# Patient Record
Sex: Female | Born: 1935 | Race: White | Hispanic: No | Smoking: Never smoker
Health system: Southern US, Community
[De-identification: ages and names within clinical notes are randomized; demographics above are authoritative.]

## PROBLEM LIST (undated history)

## (undated) DIAGNOSIS — Z8673 Personal history of transient ischemic attack (TIA), and cerebral infarction without residual deficits: Secondary | ICD-10-CM

## (undated) DIAGNOSIS — F0390 Unspecified dementia without behavioral disturbance: Secondary | ICD-10-CM

## (undated) DIAGNOSIS — I2510 Atherosclerotic heart disease of native coronary artery without angina pectoris: Secondary | ICD-10-CM

## (undated) DIAGNOSIS — E785 Hyperlipidemia, unspecified: Secondary | ICD-10-CM

## (undated) HISTORY — DX: Atherosclerotic heart disease of native coronary artery without angina pectoris: I25.10

## (undated) HISTORY — DX: Personal history of transient ischemic attack (TIA), and cerebral infarction without residual deficits: Z86.73

## (undated) HISTORY — DX: Hyperlipidemia, unspecified: E78.5

---

## 2004-08-04 ENCOUNTER — Ambulatory Visit: Payer: Self-pay | Admitting: Gastroenterology

## 2004-09-20 ENCOUNTER — Ambulatory Visit: Payer: Self-pay | Admitting: Internal Medicine

## 2005-09-22 ENCOUNTER — Ambulatory Visit: Payer: Self-pay | Admitting: Internal Medicine

## 2006-06-04 ENCOUNTER — Ambulatory Visit: Payer: Self-pay | Admitting: Ophthalmology

## 2006-06-11 ENCOUNTER — Ambulatory Visit: Payer: Self-pay | Admitting: Ophthalmology

## 2006-09-26 ENCOUNTER — Ambulatory Visit: Payer: Self-pay | Admitting: Internal Medicine

## 2007-09-30 ENCOUNTER — Ambulatory Visit: Payer: Self-pay | Admitting: Internal Medicine

## 2007-10-16 ENCOUNTER — Ambulatory Visit: Payer: Self-pay | Admitting: Ophthalmology

## 2007-10-16 ENCOUNTER — Other Ambulatory Visit: Payer: Self-pay

## 2007-11-05 ENCOUNTER — Ambulatory Visit: Payer: Self-pay | Admitting: Ophthalmology

## 2008-10-01 ENCOUNTER — Ambulatory Visit: Payer: Self-pay | Admitting: Internal Medicine

## 2009-10-07 ENCOUNTER — Ambulatory Visit: Payer: Self-pay | Admitting: Internal Medicine

## 2010-10-18 ENCOUNTER — Ambulatory Visit: Payer: Self-pay | Admitting: Internal Medicine

## 2012-05-13 ENCOUNTER — Ambulatory Visit: Payer: Self-pay | Admitting: Internal Medicine

## 2012-05-13 IMAGING — CT CT HEAD WITHOUT CONTRAST
1 series · 16 of 27 positions shown, 20 images · non-contrast
Comparison: none

REASON FOR EXAM: memory loss  eval senile dementia
COMMENTS:

PROCEDURE:     KCT - KCT HEAD WITHOUT CONTRAST  - [DATE]  [DATE]
RESULT:     History: Memory loss.
Comparison Study: No recent.

[Series 2: soft tissue · axial · 0.42mm/px · z∈[-213,-93]mm · 16 of 27 slices shown, 20 images]
[im 2/27  brain]
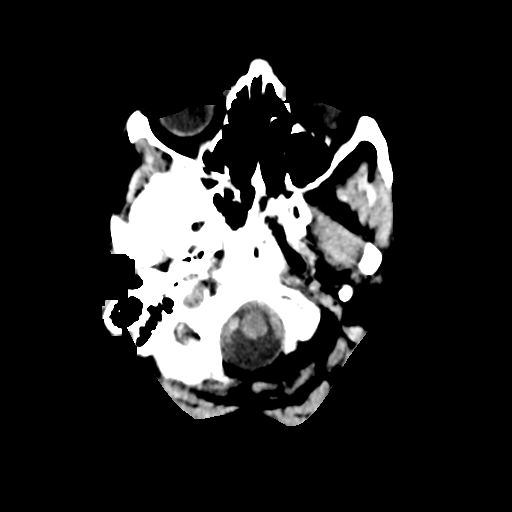
[im 2/27  bone]
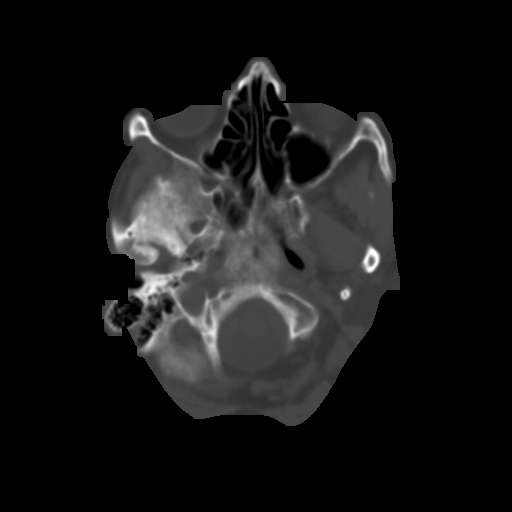
[im 4/27  brain]
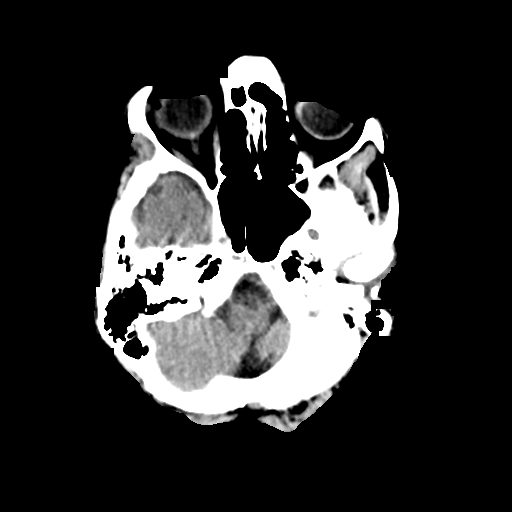
[im 5/27  brain]
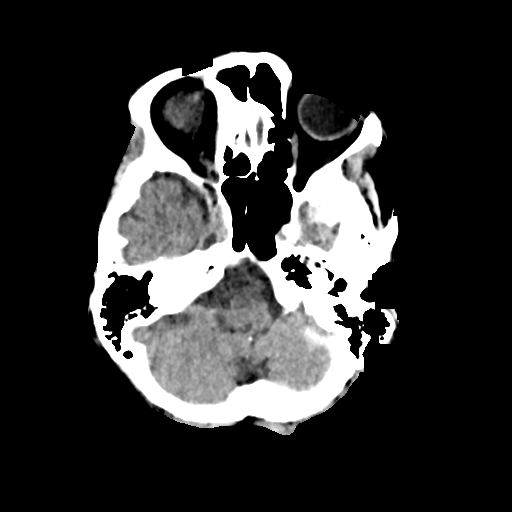
[im 7/27  brain]
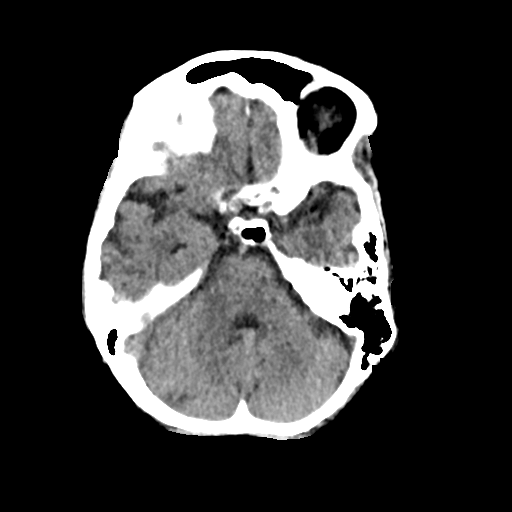
[im 9/27  brain]
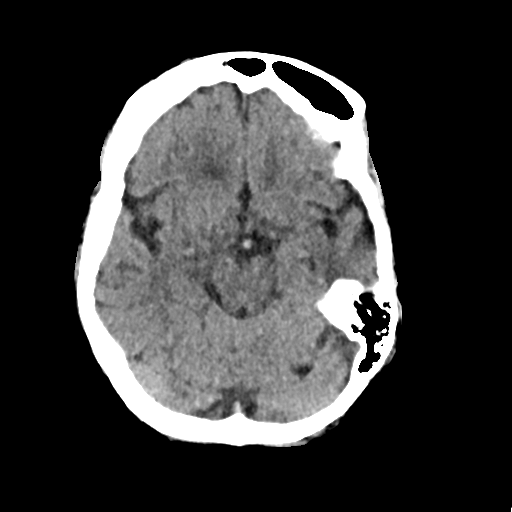
[im 9/27  bone]
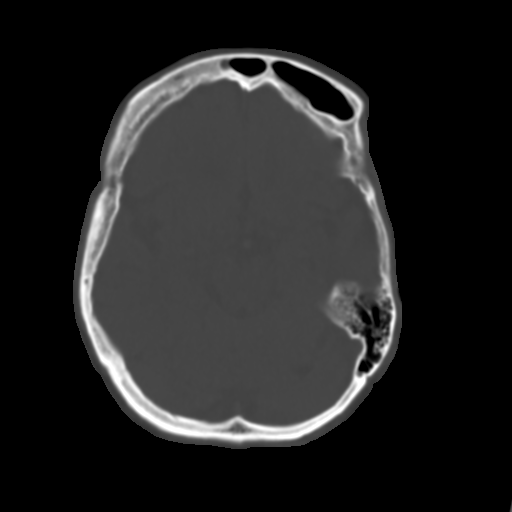
[im 10/27  brain]
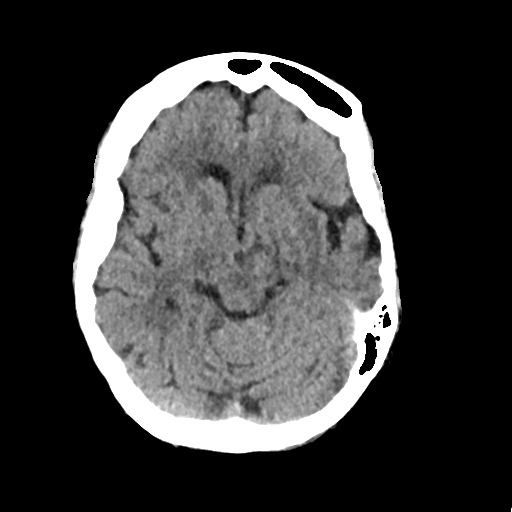
[im 12/27  brain]
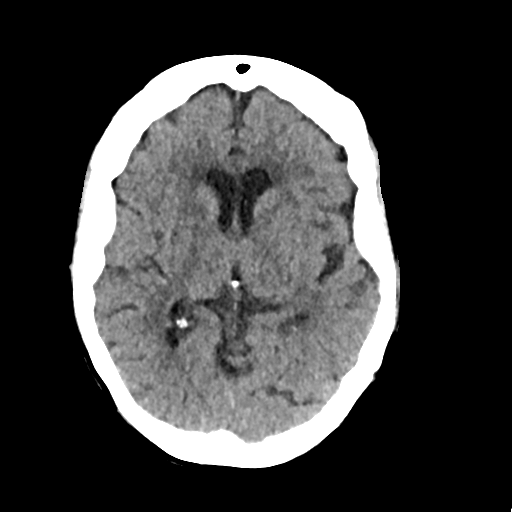
[im 13/27  brain]
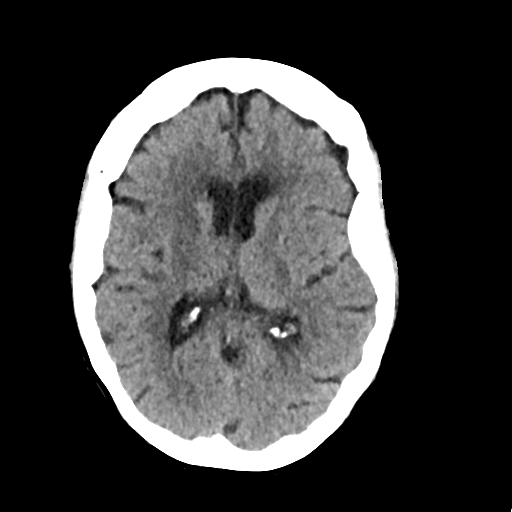
[im 15/27  brain]
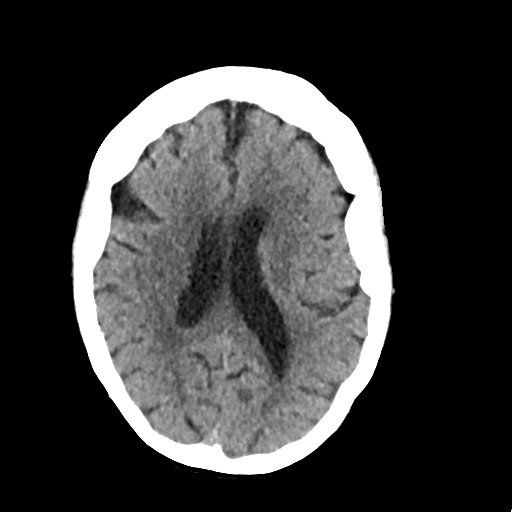
[im 15/27  bone]
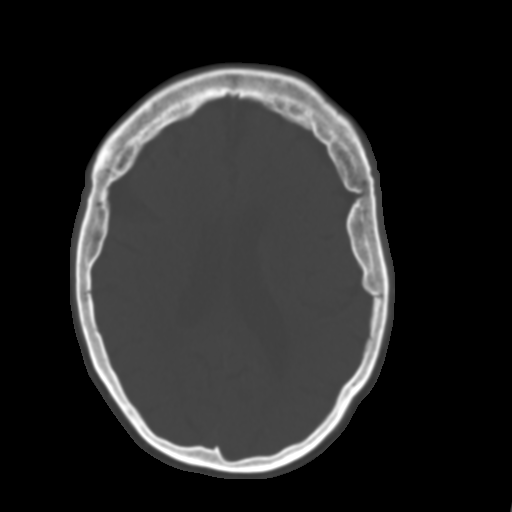
[im 16/27  brain]
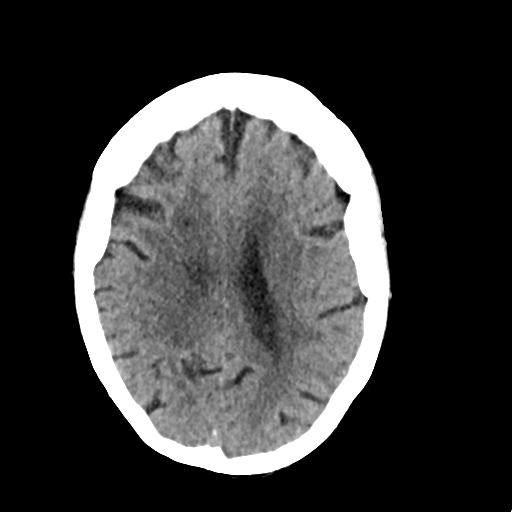
[im 18/27  brain]
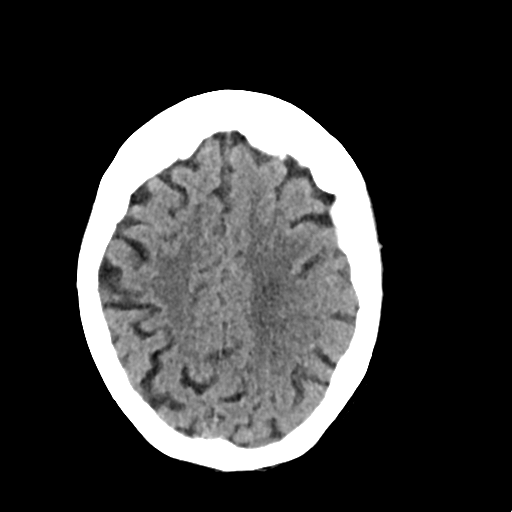
[im 19/27  brain]
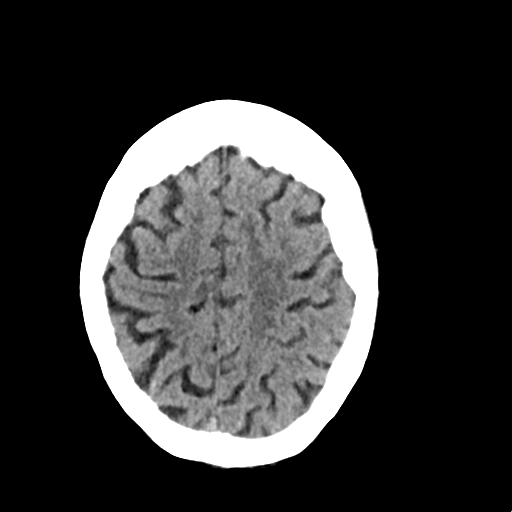
[im 21/27  brain]
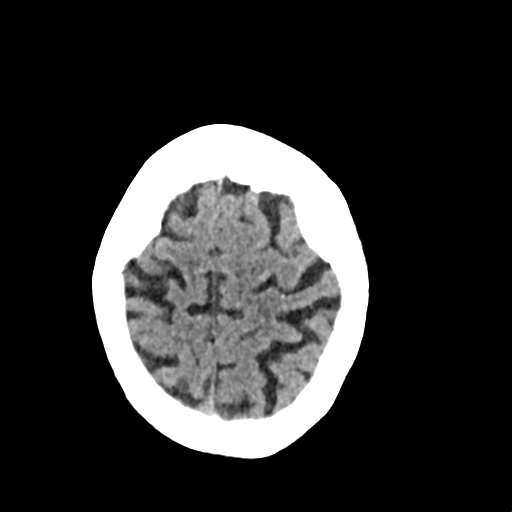
[im 21/27  bone]
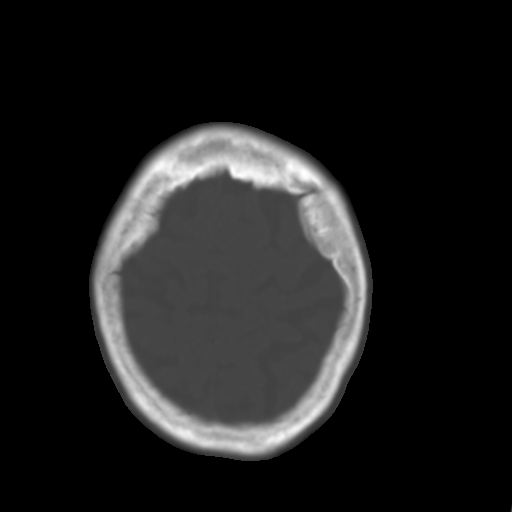
[im 23/27  brain]
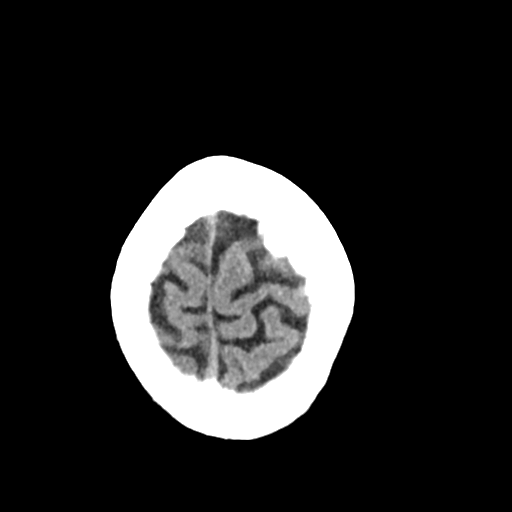
[im 24/27  brain]
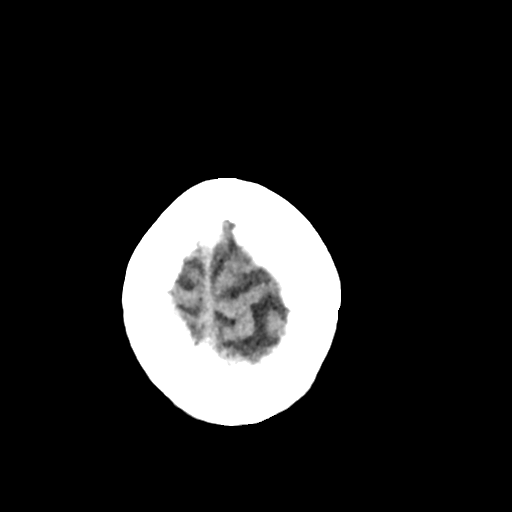
[im 26/27  brain]
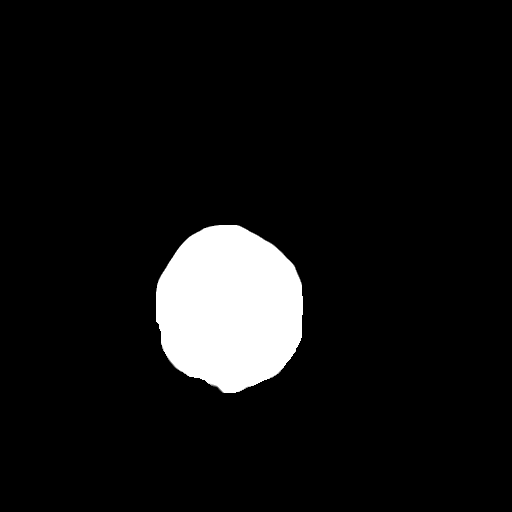

[16 of 27 positions shown; findings below may reference images not displayed]

FINDINGS: Standard nonenhanced CT obtained. Preventricular white matter
changes noted consistent with chronic ischemia. No mass. No hydrocephalus.
No hemorrhage. Orbits and paranasal sinuses normal where visualized. No bony
abnormality.
IMPRESSION: Chronic ischemic change.

## 2016-02-15 ENCOUNTER — Encounter: Payer: Medicare HMO | Attending: Physician Assistant | Admitting: Physician Assistant

## 2016-02-15 DIAGNOSIS — L89312 Pressure ulcer of right buttock, stage 2: Secondary | ICD-10-CM | POA: Insufficient documentation

## 2016-02-15 DIAGNOSIS — L89322 Pressure ulcer of left buttock, stage 2: Secondary | ICD-10-CM | POA: Diagnosis present

## 2016-02-15 DIAGNOSIS — F0390 Unspecified dementia without behavioral disturbance: Secondary | ICD-10-CM | POA: Insufficient documentation

## 2016-02-16 NOTE — Progress Notes (Signed)
Sherrilyn RistBLANCHARD, CHRISTINE M. ([id number]) Visit Report for 02/15/2016 Allergy List Details Perry MountBLANCHARD, CHRISTINE 02/15/2016 8:00 Patient Name: Date of Service: M. AM Medical Record Patient Account Number: [id number] Number: Treating RN: Huel CoventryWoody, Kim Date of Birth/Sex: 10/06/1927 (82 y.o. Female) Other Clinician: Primary Care Physician: Barbette ReichmannHande, Vishwanath Treating STONE III, HOYT Referring Physician: Bobby RumpfAREW, KHARY Physician/Extender: Weeks in Treatment: 0 Allergies Active Allergies No Known Drug Allergies Allergy Notes Electronic Signature(s) Signed: 02/15/2016 5:57:19 PM By: Elliot GurneyWoody, RN, BSN, Kim RN, BSN Entered By: Elliot GurneyWoody, RN, BSN, Kim on 02/15/2016 08:18:45 Sherrilyn RistBLANCHARD, CHRISTINE M. ([id number]) -------------------------------------------------------------------------------- Arrival Information Details Perry MountBLANCHARD, CHRISTINE 02/15/2016 8:00 Patient Name: Date of Service: M. AM Medical Record Patient Account Number: [id number] Number: Treating RN: Huel CoventryWoody, Kim Date of Birth/Sex: 10/19/1927 (82 y.o. Female) Other Clinician: Primary Care Physician: Marcello FennelHande, Vishwanath Treating STONE III, HOYT Referring Physician: Bobby RumpfAREW, KHARY Physician/Extender: Tania AdeWeeks in Treatment: 0 Visit Information Patient Arrived: Ambulatory Arrival Time: 08:16 Accompanied By: sister in law, Juel BurrowBecky Miles Transfer Assistance: Manual Patient Identification Verified: Yes Secondary Verification Process Yes Completed: Patient Requires Transmission- No Based Precautions: Patient Has Alerts: No Electronic Signature(s) Signed: 02/15/2016 5:57:19 PM By: Elliot GurneyWoody, RN, BSN, Kim RN, BSN Entered By: Elliot GurneyWoody, RN, BSN, Kim on 02/15/2016 08:16:51 Sherrilyn RistBLANCHARD, CHRISTINE M. ([id number]) -------------------------------------------------------------------------------- Clinic Level of Care Assessment Details Perry MountBLANCHARD, CHRISTINE 02/15/2016 8:00 Patient Name: Date of Service: M. AM Medical Record Patient Account Number:  [id number] Number: Treating RN: Huel CoventryWoody, Kim Date of Birth/Sex: 07/12/1927 (82 y.o. Female) Other Clinician: Primary Care Physician: Marcello FennelHande, Vishwanath Treating STONE III, HOYT Referring Physician: Bobby RumpfAREW, KHARY Physician/Extender: Weeks in Treatment: 0 Clinic Level of Care Assessment Items TOOL 2 Quantity Score []  - Use when only an EandM is performed on the INITIAL visit 0 ASSESSMENTS - Nursing Assessment / Reassessment X - General Physical Exam (combine w/ comprehensive assessment (listed just 1 20 below) when performed on new pt. evals) X - Comprehensive Assessment (HX, ROS, Risk Assessments, Wounds Hx, etc.) 1 25 ASSESSMENTS - Wound and Skin Assessment / Reassessment X - Simple Wound Assessment / Reassessment - one wound 1 5 []  - Complex Wound Assessment / Reassessment - multiple wounds 0 []  - Dermatologic / Skin Assessment (not related to wound area) 0 ASSESSMENTS - Ostomy and/or Continence Assessment and Care []  - Incontinence Assessment and Management 0 []  - Ostomy Care Assessment and Management (repouching, etc.) 0 PROCESS - Coordination of Care X - Simple Patient / Family Education for ongoing care 1 15 []  - Complex (extensive) Patient / Family Education for ongoing care 0 X - Staff obtains ChiropractorConsents, Records, Test Results / Process Orders 1 10 []  - Staff telephones HHA, Nursing Homes / Clarify orders / etc 0 []  - Routine Transfer to another Facility (non-emergent condition) 0 []  - Routine Hospital Admission (non-emergent condition) 0 []  - New Admissions / Manufacturing engineernsurance Authorizations / Ordering NPWT, Apligraf, etc. 0 []  - Emergency Hospital Admission (emergent condition) 0 Micallef, CHRISTINE M. ([id number]) X - Simple Discharge Coordination 1 10 []  - Complex (extensive) Discharge Coordination 0 PROCESS - Special Needs []  - Pediatric / Minor Patient Management 0 []  - Isolation Patient Management 0 []  - Hearing / Language / Visual special needs 0 []  - Assessment of  Community assistance (transportation, D/C planning, etc.) 0 []  - Additional assistance / Altered mentation 0 []  - Support Surface(s) Assessment (bed, cushion, seat, etc.) 0 INTERVENTIONS - Wound Cleansing / Measurement X - Wound Imaging (photographs - any number of wounds) 1 5 []  - Wound Tracing (instead of photographs) 0 X -  Simple Wound Measurement - one wound 1 5 []  - Complex Wound Measurement - multiple wounds 0 X - Simple Wound Cleansing - one wound 1 5 []  - Complex Wound Cleansing - multiple wounds 0 INTERVENTIONS - Wound Dressings X - Small Wound Dressing one or multiple wounds 1 10 []  - Medium Wound Dressing one or multiple wounds 0 []  - Large Wound Dressing one or multiple wounds 0 []  - Application of Medications - injection 0 INTERVENTIONS - Miscellaneous []  - External ear exam 0 []  - Specimen Collection (cultures, biopsies, blood, body fluids, etc.) 0 []  - Specimen(s) / Culture(s) sent or taken to Lab for analysis 0 []  - Patient Transfer (multiple staff / Michiel Sites Lift / Similar devices) 0 []  - Simple Staple / Suture removal (25 or less) 0 Vanessen, CHRISTINE M. ([id number]) []  - Complex Staple / Suture removal (26 or more) 0 []  - Hypo / Hyperglycemic Management (close monitor of Blood Glucose) 0 []  - Ankle / Brachial Index (ABI) - do not check if billed separately 0 Has the patient been seen at the hospital within the last three years: Yes Total Score: 110 Level Of Care: New/Established - Level 3 Electronic Signature(s) Signed: 02/15/2016 5:57:19 PM By: Elliot Gurney, RN, BSN, Kim RN, BSN Entered By: Elliot Gurney, RN, BSN, Kim on 02/15/2016 08:51:46 JANESE, RADABAUGH ([id number]) -------------------------------------------------------------------------------- Encounter Discharge Information Details CHANAH, TIDMORE 02/15/2016 8:00 Patient Name: Date of Service: M. AM Medical Record Patient Account Number: [id number] Number: Treating RN: Huel Coventry Date of  Birth/Sex: [birth year] (82 y.o. Female) Other Clinician: Primary Care Physician: Marcello Fennel, Vishwanath Treating STONE III, HOYT Referring Physician: Bobby Rumpf Physician/Extender: Weeks in Treatment: 0 Encounter Discharge Information Items Discharge Pain Level: 0 Discharge Condition: Stable Ambulatory Status: Ambulatory Discharge Destination: Home Transportation: Private Auto Accompanied By: sister in law Schedule Follow-up Appointment: No Medication Reconciliation completed and provided to Patient/Care Yes Madisin Hasan: Provided on Clinical Summary of Care: 02/15/2016 Form Type Recipient Paper Patient CB Electronic Signature(s) Signed: 02/15/2016 8:55:55 AM By: Gwenlyn Perking Entered By: Gwenlyn Perking on 02/15/2016 08:55:55 Keierra, Nudo CHRISTINE M. ([id number]) -------------------------------------------------------------------------------- Lower Extremity Assessment Details COLBURN, CHRISTINE 02/15/2016 8:00 Patient Name: Date of Service: M. AM Medical Record Patient Account Number: [id number] Number: Treating RN: Huel Coventry Date of Birth/Sex: [birth year] (82 y.o. Female) Other Clinician: Primary Care Physician: Barbette Reichmann Treating STONE III, HOYT Referring Physician: Bobby Rumpf Physician/Extender: Tania Ade in Treatment: 0 Electronic Signature(s) Signed: 02/15/2016 5:57:19 PM By: Elliot Gurney, RN, BSN, Kim RN, BSN Entered By: Elliot Gurney, RN, BSN, Kim on 02/15/2016 08:18:34 OMUNIQUE, PEDERSON ([id number]) -------------------------------------------------------------------------------- Multi Wound Chart Details SPILMAN, CHRISTINE 02/15/2016 8:00 Patient Name: Date of Service: M. AM Medical Record Patient Account Number: [id number] Number: Treating RN: Huel Coventry Date of Birth/Sex: [birth year] (82 y.o. Female) Other Clinician: Primary Care Physician: Marcello Fennel, Vishwanath Treating STONE III, HOYT Referring Physician: Bobby Rumpf Physician/Extender: Weeks in  Treatment: 0 Vital Signs Height(in): 62 Pulse(bpm): 62 Weight(lbs): 101.2 Blood Pressure 122/68 (mmHg): Body Mass Index(BMI): 19 Temperature(F): 98.4 Respiratory Rate 16 (breaths/min): Photos: [1:No Photos] [2:No Photos] [N/A:N/A] Wound Location: [1:Right Gluteus] [2:Left Gluteus] [N/A:N/A] Wounding Event: [1:Pressure Injury] [2:Pressure Injury] [N/A:N/A] Primary Etiology: [1:Pressure Ulcer] [2:Pressure Ulcer] [N/A:N/A] Comorbid History: [1:Dementia] [2:Dementia] [N/A:N/A] Date Acquired: [1:02/01/2016] [2:02/01/2016] [N/A:N/A] Weeks of Treatment: [1:0] [2:0] [N/A:N/A] Wound Status: [1:Open] [2:Open] [N/A:N/A] Measurements L x W x D 2x1x0.1 [2:3x2.8x0.1] [N/A:N/A] (cm) Area (cm) : [1:1.571] [2:6.597] [N/A:N/A] Volume (cm) : [1:0.157] [2:0.66] [N/A:N/A] % Reduction in Area: [1:76.20%] [2:N/A] [N/A:N/A] % Reduction in Volume: 76.20% [2:N/A] [N/A:N/A] Classification: [1:Category/Stage  II] [2:Category/Stage II] [N/A:N/A] Exudate Amount: [1:None Present] [2:None Present] [N/A:N/A] Wound Margin: [1:Flat and Intact] [2:Flat and Intact] [N/A:N/A] Granulation Amount: [1:Large (67-100%)] [2:Large (67-100%)] [N/A:N/A] Granulation Quality: [1:Pink] [2:N/A] [N/A:N/A] Necrotic Amount: [1:None Present (0%)] [2:None Present (0%)] [N/A:N/A] Exposed Structures: [1:Fascia: No Fat: No Tendon: No Muscle: No Joint: No Bone: No Limited to Skin Breakdown] [2:Fascia: No Fat: No Tendon: No Muscle: No Joint: No Bone: No Limited to Skin Breakdown] [N/A:N/A] Epithelialization: Large (67-100%) Large (67-100%) N/A Periwound Skin Texture: Edema: No Edema: No N/A Excoriation: No Excoriation: No Induration: No Induration: No Callus: No Callus: No Crepitus: No Crepitus: No Fluctuance: No Fluctuance: No Friable: No Friable: No Rash: No Rash: No Scarring: No Scarring: No Periwound Skin Maceration: No Maceration: No N/A Moisture: Moist: No Moist: No Dry/Scaly: No Dry/Scaly: No Periwound  Skin Color: Rubor: Yes Rubor: Yes N/A Atrophie Blanche: No Atrophie Blanche: No Cyanosis: No Cyanosis: No Ecchymosis: No Ecchymosis: No Erythema: No Erythema: No Hemosiderin Staining: No Hemosiderin Staining: No Mottled: No Mottled: No Pallor: No Pallor: No Temperature: No Abnormality No Abnormality N/A Tenderness on No No N/A Palpation: Wound Preparation: Ulcer Cleansing: Ulcer Cleansing: N/A Rinsed/Irrigated with Rinsed/Irrigated with Saline Saline Topical Anesthetic Topical Anesthetic Applied: None Applied: None Treatment Notes Electronic Signature(s) Signed: 02/15/2016 5:57:19 PM By: Elliot Gurney, RN, BSN, Kim RN, BSN Entered By: Elliot Gurney, RN, BSN, Kim on 02/15/2016 08:46:39 MAUD, RUBENDALL ([id number]) -------------------------------------------------------------------------------- Pain Assessment Details RADLEY, TESTON 02/15/2016 8:00 Patient Name: Date of Service: M. AM Medical Record Patient Account Number: [id number] Number: Treating RN: Huel Coventry Date of Birth/Sex: [birth year] (82 y.o. Female) Other Clinician: Primary Care Physician: Marcello Fennel, Vishwanath Treating STONE III, HOYT Referring Physician: Bobby Rumpf Physician/Extender: Weeks in Treatment: 0 Active Problems Location of Pain Severity and Description of Pain Patient Has Paino No Site Locations With Dressing Change: No Pain Management and Medication Current Pain Management: Electronic Signature(s) Signed: 02/15/2016 5:57:19 PM By: Elliot Gurney, RN, BSN, Kim RN, BSN Entered By: Elliot Gurney, RN, BSN, Kim on 02/15/2016 08:17:01 DARYA, BIGLER ([id number]) -------------------------------------------------------------------------------- Patient/Caregiver Education Details LANEAH, LUFT 02/15/2016 8:00 Patient Name: Date of Service: M. AM Medical Record Patient Account Number: [id number] Number: Treating RN: Huel Coventry Date of Birth/Gender: [birth year] (82 y.o.  Female) Other Clinician: Primary Care Physician: Marcello Fennel, Vishwanath Treating STONE III, HOYT Referring Physician: Bobby Rumpf Physician/Extender: Tania Ade in Treatment: 0 Education Assessment Education Provided To: Patient and Caregiver Education Topics Provided Pressure: Handouts: Preventing Pressure Ulcers Methods: Demonstration, Explain/Verbal, Printed Responses: State content correctly Safety: Handouts: Personal Safety Methods: Explain/Verbal Responses: State content correctly Electronic Signature(s) Signed: 02/15/2016 5:57:19 PM By: Elliot Gurney, RN, BSN, Kim RN, BSN Entered By: Elliot Gurney, RN, BSN, Kim on 02/15/2016 08:52:58 RUTHELLA, KIRCHMAN ([id number]) -------------------------------------------------------------------------------- Wound Assessment Details DARCELLE, HERRADA 02/15/2016 8:00 Patient Name: Date of Service: M. AM Medical Record Patient Account Number: [id number] Number: Treating RN: Huel Coventry Date of Birth/Sex: 08/26/1927 (82 y.o. Female) Other Clinician: Primary Care Physician: Barbette Reichmann Treating STONE III, HOYT Referring Physician: Bobby Rumpf Physician/Extender: Weeks in Treatment: 0 Wound Status Wound Number: 1 Primary Etiology: Pressure Ulcer Wound Location: Right Gluteus Wound Status: Healed - Epithelialized Wounding Event: Pressure Injury Comorbid History: Dementia Date Acquired: 02/01/2016 Weeks Of Treatment: 0 Clustered Wound: No Photos Photo Uploaded By: Elliot Gurney, RN, BSN, Kim on 02/15/2016 09:02:13 Wound Measurements Length: (cm) 0 % Reduction i Width: (cm) 0 % Reduction i Depth: (cm) 0 Epithelializa Area: (cm) 0 Tunneling: Volume: (cm) 0 Undermining: n Area: 100% n Volume: 100% tion: Large (67-100%) No No Wound  Description Classification: Category/Stage II Wound Margin: Flat and Intact Exudate Amount: None Present Wound Bed Granulation Amount: Large (67-100%) Exposed Structure Granulation Quality:  Pink Fascia Exposed: No Necrotic Amount: None Present (0%) Fat Layer Exposed: No Tendon Exposed: No Muscle Exposed: No RUBYANN, LINGLE. ([id number]) Joint Exposed: No Bone Exposed: No Limited to Skin Breakdown Periwound Skin Texture Texture Color No Abnormalities Noted: No No Abnormalities Noted: No Callus: No Atrophie Blanche: No Crepitus: No Cyanosis: No Excoriation: No Ecchymosis: No Fluctuance: No Erythema: No Friable: No Hemosiderin Staining: No Induration: No Mottled: No Localized Edema: No Pallor: No Rash: No Rubor: Yes Scarring: No Temperature / Pain Moisture Temperature: No Abnormality No Abnormalities Noted: No Dry / Scaly: No Maceration: No Moist: No Wound Preparation Ulcer Cleansing: Rinsed/Irrigated with Saline Topical Anesthetic Applied: None Treatment Notes Wound #1 (Right Gluteus) 1. Cleansed with: Clean wound with Normal Saline 5. Secondary Dressing Applied Bordered Foam Dressing Electronic Signature(s) Signed: 02/15/2016 5:57:19 PM By: Elliot Gurney, RN, BSN, Kim RN, BSN Entered By: Elliot Gurney, RN, BSN, Kim on 02/15/2016 08:54:01 KENNEDI, LIZARDO ([id number]) -------------------------------------------------------------------------------- Wound Assessment Details LANNA, LABELLA 02/15/2016 8:00 Patient Name: Date of Service: M. AM Medical Record Patient Account Number: [id number] Number: Treating RN: Huel Coventry Date of Birth/Sex: Jul 21, 1927 (82 y.o. Female) Other Clinician: Primary Care Physician: Marcello Fennel, Vishwanath Treating STONE III, HOYT Referring Physician: Bobby Rumpf Physician/Extender: Weeks in Treatment: 0 Wound Status Wound Number: 2 Primary Etiology: Pressure Ulcer Wound Location: Left Gluteus Wound Status: Healed - Epithelialized Wounding Event: Pressure Injury Comorbid History: Dementia Date Acquired: 02/01/2016 Weeks Of Treatment: 0 Clustered Wound: No Photos Photo Uploaded By: Elliot Gurney, RN, BSN, Kim on  02/15/2016 09:02:14 Wound Measurements Length: (cm) 0 % Reduction i Width: (cm) 0 % Reduction i Depth: (cm) 0 Epithelializa Area: (cm) 0 Tunneling: Volume: (cm) 0 Undermining: n Area: 100% n Volume: 100% tion: Large (67-100%) No No Wound Description Classification: Category/Stage II Wound Margin: Flat and Intact Exudate Amount: None Present Wound Bed Granulation Amount: Large (67-100%) Exposed Structure Necrotic Amount: None Present (0%) Fascia Exposed: No Fat Layer Exposed: No Tendon Exposed: No Muscle Exposed: No Nima, [place] CHRISTINE M. ([id number]) Joint Exposed: No Bone Exposed: No Limited to Skin Breakdown Periwound Skin Texture Texture Color No Abnormalities Noted: No No Abnormalities Noted: No Callus: No Atrophie Blanche: No Crepitus: No Cyanosis: No Excoriation: No Ecchymosis: No Fluctuance: No Erythema: No Friable: No Hemosiderin Staining: No Induration: No Mottled: No Localized Edema: No Pallor: No Rash: No Rubor: Yes Scarring: No Temperature / Pain Moisture Temperature: No Abnormality No Abnormalities Noted: No Dry / Scaly: No Maceration: No Moist: No Wound Preparation Ulcer Cleansing: Rinsed/Irrigated with Saline Topical Anesthetic Applied: None Treatment Notes Wound #2 (Left Gluteus) 1. Cleansed with: Clean wound with Normal Saline 5. Secondary Dressing Applied Bordered Foam Dressing Electronic Signature(s) Signed: 02/15/2016 5:57:19 PM By: Elliot Gurney, RN, BSN, Kim RN, BSN Entered By: Elliot Gurney, RN, BSN, Kim on 02/15/2016 08:54:01 TOMIE, SPIZZIRRI ([id number]) -------------------------------------------------------------------------------- Vitals Details KLINDT, CHRISTINE 02/15/2016 8:00 Patient Name: Date of Service: M. AM Medical Record Patient Account Number: [id number] Number: Treating RN: Huel Coventry Date of Birth/Sex: 25-Dec-1927 (82 y.o. Female) Other Clinician: Primary Care Physician: Marcello Fennel, Vishwanath  Treating STONE III, HOYT Referring Physician: Bobby Rumpf Physician/Extender: Weeks in Treatment: 0 Vital Signs Time Taken: 08:17 Temperature (F): 98.4 Height (in): 62 Pulse (bpm): 62 Source: Stated Respiratory Rate (breaths/min): 16 Weight (lbs): 101.2 Blood Pressure (mmHg): 122/68 Source: Measured Reference Range: 80 - 120 mg / dl Body Mass Index (BMI): 18.5 Electronic  Signature(s) Signed: 02/15/2016 5:57:19 PM By: Elliot GurneyWoody, RN, BSN, Kim RN, BSN Entered By: Elliot GurneyWoody, RN, BSN, Kim on 02/15/2016 08:18:20

## 2016-02-16 NOTE — Progress Notes (Signed)
Elizabeth [name], Elizabeth M. ([id number]) Visit Report for 02/15/2016 Abuse/Suicide Risk Screen Details Elizabeth [name], Elizabeth 02/15/2016 8:00 Patient Name: Date of Service: M. AM Medical Record Patient Account Number: [id number] Number: Treating RN: Huel CoventryWoody, Kim Date of Birth/Sex: 07/05/1927 (82 y.o. Female) Other Clinician: Primary Care Treating Hande, Vishwanath STONE III, HOYT Physician: Physician/Extender: Referring Physician: Bobby RumpfAREW, KHARY Weeks in Treatment: 0 Abuse/Suicide Risk Screen Items Answer ABUSE/SUICIDE RISK SCREEN: Has anyone close to you tried to hurt or harm you recentlyo No Do you feel uncomfortable with anyone in your familyo No Has anyone forced you do things that you didnot want to doo No Do you have any thoughts of harming yourselfo No Patient displays signs or symptoms of abuse and/or neglect. No Electronic Signature(s) Signed: 02/15/2016 5:57:19 PM By: Elliot GurneyWoody, RN, BSN, Kim RN, BSN Entered By: Elliot GurneyWoody, RN, BSN, Kim on 02/15/2016 08:23:46 Elizabeth [name], Elizabeth M. ([id number]) -------------------------------------------------------------------------------- Activities of Daily Living Details Elizabeth [name], Elizabeth 02/15/2016 8:00 Patient Name: Date of Service: M. AM Medical Record Patient Account Number: [id number] Number: Treating RN: Huel CoventryWoody, Kim Date of Birth/Sex: 08/16/1927 (82 y.o. Female) Other Clinician: Primary Care Treating Hande, Vishwanath STONE III, HOYT Physician: Physician/Extender: Referring Physician: Bobby RumpfAREW, KHARY Weeks in Treatment: 0 Activities of Daily Living Items Answer Activities of Daily Living (Please select one for each item) Drive Automobile Not Able Take Medications Need Assistance Use Telephone Need Assistance Care for Appearance Need Assistance Use Toilet Need Assistance Bath / Shower Need Assistance Dress Self Completely Able Feed Self Completely Able Walk Completely Able Get In / Out Bed Completely Able Housework  Completely Able Prepare Meals Need Assistance Handle Money Need Assistance Shop for Self Need Assistance Electronic Signature(s) Signed: 02/15/2016 5:57:19 PM By: Elliot GurneyWoody, RN, BSN, Kim RN, BSN Entered By: Elliot GurneyWoody, RN, BSN, Kim on 02/15/2016 08:25:05 Elizabeth [name], Elizabeth M. ([id number]) -------------------------------------------------------------------------------- Education Assessment Details Elizabeth [name], Elizabeth 02/15/2016 8:00 Patient Name: Date of Service: M. AM Medical Record Patient Account Number: [id number] Number: Treating RN: Huel CoventryWoody, Kim Date of Birth/Sex: 04/25/1927 (82 y.o. Female) Other Clinician: Primary Care Treating Hande, Vishwanath STONE III, HOYT Physician: Physician/Extender: Referring Physician: Bobby RumpfAREW, KHARY Weeks in Treatment: 0 Primary Learner Assessed: Patient Learning Preferences/Education Level/Primary Language Learning Preference: Demonstration Highest Education Level: High School Preferred Language: English Cognitive Barrier Assessment/Beliefs Language Barrier: No Translator Needed: No Memory Deficit: Yes Dementia Emotional Barrier: No Cultural/Religious Beliefs Affecting Medical No Care: Physical Barrier Assessment Impaired Vision: No Impaired Hearing: No Decreased Hand dexterity: No Knowledge/Comprehension Assessment Knowledge Level: Low Comprehension Level: Low Ability to understand written Low instructions: Ability to understand verbal Low instructions: Motivation Assessment Anxiety Level: Calm Cooperation: Cooperative Education Importance: Denies Need Interest in Health Problems: Asks Questions Perception: Confused Willingness to Engage in Self- Medium Management Activities: Medium Elizabeth [name], Elizabeth M. ([id number]) Readiness to Engage in Self- Management Activities: Electronic Signature(s) Signed: 02/15/2016 5:57:19 PM By: Elliot GurneyWoody, RN, BSN, Kim RN, BSN Entered By: Elliot GurneyWoody, RN, BSN, Kim on 02/15/2016 08:25:54 Elizabeth [name],  Elizabeth M. ([id number]) -------------------------------------------------------------------------------- Fall Risk Assessment Details Elizabeth [name], Elizabeth 02/15/2016 8:00 Patient Name: Date of Service: M. AM Medical Record Patient Account Number: [id number] Number: Treating RN: Huel CoventryWoody, Kim Date of Birth/Sex: 07/23/1927 (82 y.o. Female) Other Clinician: Primary Care Treating Hande, Vishwanath STONE III, HOYT Physician: Physician/Extender: Referring Physician: Bobby RumpfAREW, KHARY Weeks in Treatment: 0 Fall Risk Assessment Items Have you had 2 or more falls in the last 12 monthso 0 No Have you had any fall that resulted in injury in the last 12 monthso 0 No FALL RISK ASSESSMENT: History of falling -  immediate or within 3 months 0 No Secondary diagnosis 0 No Ambulatory aid None/bed rest/wheelchair/nurse 0 Yes Crutches/cane/walker 0 No Furniture 0 No IV Access/Saline Lock 0 No Gait/Training Normal/bed rest/immobile 0 Yes Weak 0 No Impaired 0 No Mental Status Oriented to own ability 0 Yes Electronic Signature(s) Signed: 02/15/2016 5:57:19 PM By: Elliot GurneyWoody, RN, BSN, Kim RN, BSN Entered By: Elliot GurneyWoody, RN, BSN, Kim on 02/15/2016 08:26:15 Elizabeth [name], Elizabeth M. ([id number]) -------------------------------------------------------------------------------- Nutrition Risk Assessment Details Elizabeth [name], Elizabeth 02/15/2016 8:00 Patient Name: Date of Service: M. AM Medical Record Patient Account Number: [id number] Number: Treating RN: Huel CoventryWoody, Kim Date of Birth/Sex: 08/06/1927 (82 y.o. Female) Other Clinician: Primary Care Treating Hande, Vishwanath STONE III, HOYT Physician: Physician/Extender: Referring Physician: Bobby RumpfAREW, KHARY Weeks in Treatment: 0 Height (in): 62 Weight (lbs): 101.2 Body Mass Index (BMI): 18.5 Nutrition Risk Assessment Items NUTRITION RISK SCREEN: I have an illness or condition that made me change the kind and/or 0 No amount of food I eat I eat fewer  than two meals per day 0 No I eat few fruits and vegetables, or milk products 0 No I have three or more drinks of beer, liquor or wine almost every day 0 No I have tooth or mouth problems that make it hard for me to eat 0 No I don't always have enough money to buy the food I need 0 No I eat alone most of the time 0 No I take three or more different prescribed or over-the-counter drugs a 0 No day Without wanting to, I have lost or gained 10 pounds in the last six 0 No months I am not always physically able to shop, cook and/or feed myself 0 No Nutrition Protocols Good Risk Protocol 0 No interventions needed Moderate Risk Protocol Electronic Signature(s) Signed: 02/15/2016 5:57:19 PM By: Elliot GurneyWoody, RN, BSN, Kim RN, BSN Entered By: Elliot GurneyWoody, RN, BSN, Kim on 02/15/2016 08:65:7808:26:29

## 2016-02-16 NOTE — Progress Notes (Signed)
Elizabeth [name], Elizabeth [name] ([id number]) Visit Report for 02/15/2016 Chief Complaint Document Details Elizabeth [name], Elizabeth [name] 02/15/2016 8:00 Patient Name: Date of Service: M. AM Medical Record Patient Account Number: [id number] Number: Treating RN: Curtis Sites Date of Birth/Sex: 07/04/1927 (82 y.o. Female) Other Clinician: Primary Care Treating Hande, Vishwanath STONE III, HOYT Physician: Physician/Extender: Referring Physician: Bobby Rumpf Weeks in Treatment: 0 Information Obtained from: Patient Chief Complaint evaluation due to bilateral gluteal pressure ulcers Electronic Signature(s) Signed: 02/16/2016 12:52:14 AM By: Lenda Kelp PA-C Entered By: Lenda Kelp on 02/15/2016 09:04:05 Elizabeth [name], Elizabeth [name]. ([id number]) -------------------------------------------------------------------------------- HPI Details TIONNE, CARELLI 02/15/2016 8:00 Patient Name: Date of Service: M. AM Medical Record Patient Account Number: [id number] Number: Treating RN: Curtis Sites Date of Birth/Sex: [birth year] (82 y.o. Female) Other Clinician: Primary Care Treating Hande, Vishwanath STONE III, HOYT Physician: Physician/Extender: Referring Physician: Bobby Rumpf Weeks in Treatment: 0 History of Present Illness HPI Description: Patient presents today for evaluation of her bilateral gluteal pressure ulcers which have been treated at this point in time by her primary care provider and her family has been helping to manage this as well. Unfortunately patient has dementia and upp until recently was able to live at home but now is living with family. She was prescribed doxycycline and Bactroban previously for this. According to records this has been present since around October 21 as best I can ascertain. Fortunately patient has no other significant medical problems despite her age she is an extremely healthy individual. In discussion with her family today it seems that  this is likely a result of her being more sedentary once he gets cold in the winter. During the summer she apparently is much more active and typically his outside a lot of the day to the point that she may even forget to come in and eat. patient has no othher concerns at this point in time. Electronic Signature(s) Signed: 02/16/2016 12:52:14 AM By: Lenda Kelp PA-C Entered By: Lenda Kelp on 02/15/2016 09:07:32 Elizabeth [name], Elizabeth [name] ([id number]) -------------------------------------------------------------------------------- Physical Exam Details Elizabeth [name], Elizabeth [name] 02/15/2016 8:00 Patient Name: Date of Service: M. AM Medical Record Patient Account Number: [id number] Number: Treating RN: Curtis Sites Date of Birth/Sex: 12-28-1927 (82 y.o. Female) Other Clinician: Primary Care Treating Hande, Vishwanath STONE III, HOYT Physician: Physician/Extender: Referring Physician: Bobby Rumpf Weeks in Treatment: 0 Constitutional supine blood pressure is within target range for patient.. pulse regular and within target range for patient.Marland Kitchen respirations regular, non-labored and within target range for patient.Marland Kitchen temperature within target range for patient.. Well-nourished and well-hydrated in no acute distress. Eyes conjunctiva clear no eyelid edema noted. pupils equal round and reactive to light and accommodation. Ears, Nose, Mouth, and Throat no gross abnormality of ear auricles or external auditory canals. normal hearing noted during conversation. mucus membranes moist. Respiratory normal breathing without difficulty. clear to auscultation bilaterally. Cardiovascular regular rate and rhythm with normal S1, S2. no clubbing, cyanosis, edema, <3 sec cap refill. Gastrointestinal (GI) soft, non-tender, non-distended, +BS. No obvious ventral hernia noted. Musculoskeletal normal gait and posture. Psychiatric Patient is not able to cooperate in decision making regarding care.  Patient has dementia. pleasant and cooperative. Electronic Signature(s) Signed: 02/16/2016 12:52:14 AM By: Lenda Kelp PA-C Entered By: Lenda Kelp on 02/15/2016 09:09:27 Elizabeth [name], Elizabeth [name] ([id number]) -------------------------------------------------------------------------------- Physician Orders Details Elizabeth [name], Elizabeth [name] 02/15/2016 8:00 Patient Name: Date of Service: M. AM Medical Record Patient Account Number: [id number] Number: Treating RN: Huel Coventry Date of Birth/Sex: 1927/12/18 (82 y.o. Female) Other Clinician: Primary  Care Treating Hande, Vishwanath STONE III, HOYT Physician: Physician/Extender: Referring Physician: Bobby Rumpf Weeks in Treatment: 0 Verbal / Phone Orders: Yes Clinician: Huel Coventry Read Back and Verified: Yes Diagnosis Coding Primary Wound Dressing Wound #1 Right Gluteus o Boardered Foam Dressing Wound #2 Left Gluteus o Boardered Foam Dressing [facility]charge From [facility] Wound #1 Right Gluteus o Discharge from Wound Care Center Wound #2 Left Gluteus o Discharge from Wound Care Center Electronic Signature(s) Signed: 02/15/2016 5:57:19 PM By: Elliot Gurney RN, BSN, Kim RN, BSN Signed: 02/16/2016 12:52:14 AM By: Lenda Kelp PA-C Entered By: Elliot Gurney, RN, BSN, Kim on 02/15/2016 08:51:15 Elizabeth [name], Elizabeth [name] ([id number]) -------------------------------------------------------------------------------- Problem List Details Elizabeth [name], Elizabeth [name] 02/15/2016 8:00 Patient Name: Date of Service: M. AM Medical Record Patient Account Number: [id number] Number: Treating RN: Curtis Sites Date of Birth/Sex: [birth year] (82 y.o. Female) Other Clinician: Primary Care Treating Hande, Vishwanath STONE III, HOYT Physician: Physician/Extender: Referring Physician: Bobby Rumpf Weeks in Treatment: 0 Active Problems ICD-10 Encounter Code Description Active Date Diagnosis L89.322 Pressure ulcer of left buttock, stage 2  02/15/2016 Yes L89.312 Pressure ulcer of right buttock, stage 2 02/15/2016 Yes Inactive Problems Resolved Problems Electronic Signature(s) Signed: 02/16/2016 12:52:14 AM By: Lenda Kelp PA-C Entered By: Lenda Kelp on 02/15/2016 09:03:42 Elizabeth [name], Elizabeth [name] ([id number]) -------------------------------------------------------------------------------- Progress Note Details Elizabeth [name], Elizabeth [name] 02/15/2016 8:00 Patient Name: Date of Service: M. AM Medical Record Patient Account Number: [id number] Number: Treating RN: Curtis Sites Date of Birth/Sex: January 23, 1928 (82 y.o. Female) Other Clinician: Primary Care Treating Hande, Vishwanath STONE III, HOYT Physician: Physician/Extender: Referring Physician: Bobby Rumpf Weeks in Treatment: 0 Subjective Chief Complaint Information obtained from Patient evaluation due to bilateral gluteal pressure ulcers History of Present Illness (HPI) Patient presents today for evaluation of her bilateral gluteal pressure ulcers which have been treated at this point in time by her primary care provider and her family has been helping to manage this as well. Unfortunately patient has dementia and upp until recently was able to live at home but now is living with family. She was prescribed doxycycline and Bactroban previously for this. According to records this has been present since around October 21 as best I can ascertain. Fortunately patient has no other significant medical problems despite her age she is an extremely healthy individual. In discussion with her family today it seems that this is likely a result of her being more sedentary once he gets cold in the winter. During the summer she apparently is much more active and typically his outside a lot of the day to the point that she may even forget to come in and eat. patient has no othher concerns at this point in time. Wound History Patient presents with 2 open wounds that have been  present for approximately 2 weeks. Patient has been treating wounds in the following manner: mupiricin. Laboratory tests have not been performed in the last month. Patient reportedly has not tested positive for an antibiotic resistant organism. Patient reportedly has not tested positive for osteomyelitis. Patient reportedly has not had testing performed to evaluate circulation in the legs. Patient History Information obtained from Patient, Caregiver. Allergies No Known Drug Allergies Social History Never smoker, Marital Status - Widowed, Alcohol Use - Never, Drug Use - No History, Caffeine Use - Never - Coffee. Medical History JAISLYN, BLINN ([id number]) Eyes Denies history of Cataracts, Glaucoma, Optic Neuritis Ear/Nose/Mouth/Throat Denies history of Chronic sinus problems/congestion, Middle ear problems Hematologic/Lymphatic Denies history of Anemia, Hemophilia, Human Immunodeficiency Virus, Lymphedema, Sickle Cell Disease  Respiratory Denies history of Aspiration, Asthma, Chronic Obstructive Pulmonary Disease (COPD), Pneumothorax, Sleep Apnea, Tuberculosis Cardiovascular Denies history of Angina, Arrhythmia, Congestive Heart Failure, Coronary Artery Disease, Deep Vein Thrombosis, Hypertension, Hypotension, Myocardial Infarction, Peripheral Arterial Disease, Peripheral Venous Disease, Phlebitis, Vasculitis Gastrointestinal Denies history of Cirrhosis , Colitis, Crohn s, Hepatitis A, Hepatitis B, Hepatitis C Endocrine Denies history of Type I Diabetes, Type II Diabetes Genitourinary Denies history of End Stage Renal Disease Immunological Denies history of Lupus Erythematosus, Raynaud s, Scleroderma Integumentary (Skin) Denies history of History of Burn, History of pressure wounds Musculoskeletal Denies history of Gout, Rheumatoid Arthritis, Osteoarthritis, Osteomyelitis Neurologic Patient has history of Dementia Denies history of Neuropathy, Quadriplegia, Paraplegia,  Seizure Disorder Oncologic Denies history of Received Chemotherapy, Received Radiation Psychiatric Denies history of Anorexia/bulimia, Confinement Anxiety Medical And Surgical History Notes Constitutional Symptoms (General Health) Patient states she has not had any medical concerns in the last 20+ years. Review of Systems (ROS) Constitutional Symptoms (General Health) The patient has no complaints or symptoms. Eyes The patient has no complaints or symptoms. Ear/Nose/Mouth/Throat The patient has no complaints or symptoms. Hematologic/Lymphatic The patient has no complaints or symptoms. Respiratory The patient has no complaints or symptoms. Cardiovascular The patient has no complaints or symptoms. Gastrointestinal HOLLYE, PRITT. ([id number]) The patient has no complaints or symptoms. Endocrine The patient has no complaints or symptoms. Genitourinary The patient has no complaints or symptoms. Immunological The patient has no complaints or symptoms. Integumentary (Skin) Complains or has symptoms of Wounds. Denies complaints or symptoms of Bleeding or bruising tendency, Breakdown. Musculoskeletal The patient has no complaints or symptoms. Neurologic The patient has no complaints or symptoms. Oncologic The patient has no complaints or symptoms. Psychiatric The patient has no complaints or symptoms. Objective Constitutional supine blood pressure is within target range for patient.. pulse regular and within target range for patient.Marland Kitchen respirations regular, non-labored and within target range for patient.Marland Kitchen temperature within target range for patient.. Well-nourished and well-hydrated in no acute distress. Vitals Time Taken: 8:17 AM, Height: 62 in, Source: Stated, Weight: 101.2 lbs, Source: Measured, BMI: 18.5, Temperature: 98.4 F, Pulse: 62 bpm, Respiratory Rate: 16 breaths/min, Blood Pressure: 122/68 mmHg. Eyes conjunctiva clear no eyelid edema noted. pupils equal  round and reactive to light and accommodation. Ears, Nose, Mouth, and Throat no gross abnormality of ear auricles or external auditory canals. normal hearing noted during conversation. mucus membranes moist. Respiratory normal breathing without difficulty. clear to auscultation bilaterally. Cardiovascular regular rate and rhythm with normal S1, S2. no clubbing, cyanosis, edema, Gastrointestinal (GI) soft, non-tender, non-distended, +BS. No obvious ventral hernia noted. Elizabeth [name], Elizabeth [name] M. ([id number]) Musculoskeletal normal gait and posture. Psychiatric Patient is not able to cooperate in decision making regarding care. Patient has dementia. pleasant and cooperative. Integumentary (Hair, Skin) Wound #1 status is Healed - Epithelialized. Original cause of wound was Pressure Injury. The wound is located on the Right Gluteus. The wound measures 0cm length x 0cm width x 0cm depth; 0cm^2 area and 0cm^3 volume. The wound is limited to skin breakdown. There is no tunneling or undermining noted. There is a none present amount of drainage noted. The wound margin is flat and intact. There is large (67-100%) pink granulation within the wound bed. There is no necrotic tissue within the wound bed. The periwound skin appearance exhibited: Rubor. The periwound skin appearance did not exhibit: Callus, Crepitus, Excoriation, Fluctuance, Friable, Induration, Localized Edema, Rash, Scarring, Dry/Scaly, Maceration, Moist, Atrophie Blanche, Cyanosis, Ecchymosis, Hemosiderin Staining, Mottled, Pallor, Erythema. Periwound temperature was noted  as No Abnormality. Wound #2 status is Healed - Epithelialized. Original cause of wound was Pressure Injury. The wound is located on the Left Gluteus. The wound measures 0cm length x 0cm width x 0cm depth; 0cm^2 area and 0cm^3 volume. The wound is limited to skin breakdown. There is no tunneling or undermining noted. There is a none present amount of drainage noted.  The wound margin is flat and intact. There is large (67-100%) granulation within the wound bed. There is no necrotic tissue within the wound bed. The periwound skin appearance exhibited: Rubor. The periwound skin appearance did not exhibit: Callus, Crepitus, Excoriation, Fluctuance, Friable, Induration, Localized Edema, Rash, Scarring, Dry/Scaly, Maceration, Moist, Atrophie Blanche, Cyanosis, Ecchymosis, Hemosiderin Staining, Mottled, Pallor, Erythema. Periwound temperature was noted as No Abnormality. Assessment Active Problems ICD-10 L89.322 - Pressure ulcer of left buttock, stage 2 L89.312 - Pressure ulcer of right buttock, stage 2 Plan Primary Wound Dressing: Sherrilyn RistBLANCHARD, Elizabeth [name] M. ([id number]) Wound #1 Right Gluteus: Boardered Foam Dressing Wound #2 Left Gluteus: Boardered Foam Dressing [facility] Wound #1 Right Gluteus: Discharge from Wound Care Center Wound #2 Left Gluteus: Discharge from Wound Care Center Follow-Up Appointments: Medication Reconciliation completed and provided to Patient/Care Provider. A Patient Clinical Summary of Care was provided to CB At this point in time even on initial evaluation patient's wound is actually completely healed currently. I did discuss with family offloading techniques and concerns to hopefully prevent this from reopening and for the time being we are ggoing to a border foam dressing over the bilateral gluteal areas just for some protection at this point. We will see her back as needed going forward if anything worsens or changes inn the interim. All questions were answered to the best my ability today fortunately this is something that patient will not have to hopefully deal with the near future as long keep her offloaded and prevent any pressure and/or sheer friction forces going forward. Electronic Signature(s) Signed: 02/16/2016 12:52:14 AM By: Lenda KelpStone III, Hoyt PA-C Entered By: Lenda KelpStone III, Hoyt on 02/15/2016  09:10:58 Sherrilyn RistBLANCHARD, Elizabeth [name] M. ([id number]) -------------------------------------------------------------------------------- ROS/PFSH Details Perry MountBLANCHARD, Elizabeth [name] 02/15/2016 8:00 Patient Name: Date of Service: M. AM Medical Record Patient Account Number: [id number] Number: Treating RN: Huel CoventryWoody, Kim Date of Birth/Sex: 11/15/1927 (82 y.o. Female) Other Clinician: Primary Care Treating Hande, Vishwanath STONE III, HOYT Physician: Physician/Extender: Referring Physician: Bobby RumpfAREW, KHARY Weeks in Treatment: 0 Information Obtained From Patient Caregiver Wound History Do you currently have one or more open woundso Yes How many open wounds do you currently haveo 2 Approximately how long have you had your woundso 2 weeks How have you been treating your wound(s) until nowo mupiricin Has your wound(s) ever healed and then re-openedo No Have you had any lab work done in the past montho No Have you tested positive for an antibiotic resistant organism (MRSA, VRE)o No Have you tested positive for osteomyelitis (bone infection)o No Have you had any tests for circulation on your legso No Integumentary (Skin) Complaints and Symptoms: Positive for: Wounds Negative for: Bleeding or bruising tendency; Breakdown Medical History: Negative for: History of Burn; History of pressure wounds Constitutional Symptoms (General Health) Complaints and Symptoms: No Complaints or Symptoms Medical History: Past Medical History Notes: Patient states she has not had any medical concerns in the last 20+ years. Eyes Complaints and Symptoms: No Complaints or Symptoms Medical History: Negative for: Cataracts; Glaucoma; Optic Neuritis Sherrilyn RistBLANCHARD, Elizabeth [name] M. ([id number]) Ear/Nose/Mouth/Throat Complaints and Symptoms: No Complaints or Symptoms Medical History: Negative for: Chronic sinus problems/congestion;  Middle ear problems Hematologic/Lymphatic Complaints and Symptoms: No Complaints or  Symptoms Medical History: Negative for: Anemia; Hemophilia; Human Immunodeficiency Virus; Lymphedema; Sickle Cell Disease Respiratory Complaints and Symptoms: No Complaints or Symptoms Medical History: Negative for: Aspiration; Asthma; Chronic Obstructive Pulmonary Disease (COPD); Pneumothorax; Sleep Apnea; Tuberculosis Cardiovascular Complaints and Symptoms: No Complaints or Symptoms Medical History: Negative for: Angina; Arrhythmia; Congestive Heart Failure; Coronary Artery Disease; Deep Vein Thrombosis; Hypertension; Hypotension; Myocardial Infarction; Peripheral Arterial Disease; Peripheral Venous Disease; Phlebitis; Vasculitis Gastrointestinal Complaints and Symptoms: No Complaints or Symptoms Medical History: Negative for: Cirrhosis ; Colitis; Crohnos; Hepatitis A; Hepatitis B; Hepatitis C Endocrine Complaints and Symptoms: No Complaints or Symptoms Medical History: Negative for: Type I Diabetes; Type II Diabetes Genitourinary Sherrilyn RistBLANCHARD, Elizabeth [name] M. ([id number]) Complaints and Symptoms: No Complaints or Symptoms Medical History: Negative for: End Stage Renal Disease Immunological Complaints and Symptoms: No Complaints or Symptoms Medical History: Negative for: Lupus Erythematosus; Raynaudos; Scleroderma Musculoskeletal Complaints and Symptoms: No Complaints or Symptoms Medical History: Negative for: Gout; Rheumatoid Arthritis; Osteoarthritis; Osteomyelitis Neurologic Complaints and Symptoms: No Complaints or Symptoms Medical History: Positive for: Dementia Negative for: Neuropathy; Quadriplegia; Paraplegia; Seizure Disorder Oncologic Complaints and Symptoms: No Complaints or Symptoms Medical History: Negative for: Received Chemotherapy; Received Radiation Psychiatric Complaints and Symptoms: No Complaints or Symptoms Medical History: Negative for: Anorexia/bulimia; Confinement Anxiety Immunizations Pneumococcal Vaccine: Received Pneumococcal  Vaccination: No Sherrilyn RistBLANCHARD, Elizabeth [name] M. ([id number]) Family and Social History Never smoker; Marital Status - Widowed; Alcohol Use: Never; Drug Use: No History; Caffeine Use: Never - Coffee; Advanced Directives: No; Patient does not want information on Advanced Directives; Living Will: No; Medical Power of Attorney: Yes - Floreen ComberRebecca Miles (Not Provided) Electronic Signature(s) Signed: 02/15/2016 5:57:19 PM By: Elliot GurneyWoody, RN, BSN, Kim RN, BSN Signed: 02/16/2016 12:52:14 AM By: Lenda KelpStone III, Hoyt PA-C Entered By: Elliot GurneyWoody, RN, BSN, Kim on 02/15/2016 08:23:37 Sherrilyn RistBLANCHARD, Elizabeth [name] M. ([id number]) -------------------------------------------------------------------------------- Dennard SchaumannSuperBill Details Estorga, Elizabeth [name] Date of Service: 02/15/2016 Patient Name: Judie PetitM. Patient Account Number: [id number] Medical Record Treating RN: Curtis SitesDorthy, Joanna [id number] Number: Other Clinician: Date of Birth/Sex: 11/16/1927 (82 y.o. Female) Treating STONE III, Primary Care Physician: Barbette ReichmannHande, Vishwanath Physician/Extender: Leonard SchwartzHOYT Referring Physician: Bobby RumpfAREW, KHARY Weeks in Treatment: 0 Diagnosis Coding ICD-10 Codes Code Description [phone] Pressure ulcer of left buttock, stage 2 L89.312 Pressure ulcer of right buttock, stage 2 Facility Procedures CPT4 Code: [id number] Description: 99213 - WOUND CARE VISIT-LEV 3 EST PT Modifier: Quantity: 1 Physician Procedures CPT4 Code: [id number] Description: WC PHYS LEVEL 3 o NEW PT ICD-10 Description Diagnosis L89.322 Pressure ulcer of left buttock, stage 2 L89.312 Pressure ulcer of right buttock, stage 2 Modifier: Quantity: 1 Electronic Signature(s) Signed: 02/16/2016 12:52:14 AM By: Lenda KelpStone III, Hoyt PA-C Entered By: Lenda KelpStone III, Hoyt on 02/15/2016 09:11:44

## 2017-06-26 ENCOUNTER — Emergency Department: Payer: Medicare HMO

## 2017-06-26 ENCOUNTER — Encounter: Payer: Self-pay | Admitting: Emergency Medicine

## 2017-06-26 ENCOUNTER — Emergency Department
Admission: EM | Admit: 2017-06-26 | Discharge: 2017-06-26 | Disposition: A | Discharge status: Home / Self Care | Payer: Medicare HMO | Attending: Student in an Organized Health Care Education/Training Program | Admitting: Student in an Organized Health Care Education/Training Program

## 2017-06-26 DIAGNOSIS — R109 Unspecified abdominal pain: Secondary | ICD-10-CM | POA: Insufficient documentation

## 2017-06-26 DIAGNOSIS — Y929 Unspecified place or not applicable: Secondary | ICD-10-CM | POA: Diagnosis not present

## 2017-06-26 DIAGNOSIS — Y939 Activity, unspecified: Secondary | ICD-10-CM | POA: Insufficient documentation

## 2017-06-26 DIAGNOSIS — W19XXXA Unspecified fall, initial encounter: Secondary | ICD-10-CM | POA: Diagnosis not present

## 2017-06-26 DIAGNOSIS — M79642 Pain in left hand: Secondary | ICD-10-CM | POA: Diagnosis present

## 2017-06-26 DIAGNOSIS — R270 Ataxia, unspecified: Secondary | ICD-10-CM | POA: Diagnosis not present

## 2017-06-26 DIAGNOSIS — Y999 Unspecified external cause status: Secondary | ICD-10-CM | POA: Diagnosis not present

## 2017-06-26 DIAGNOSIS — F0390 Unspecified dementia without behavioral disturbance: Secondary | ICD-10-CM | POA: Diagnosis not present

## 2017-06-26 LAB — CBC WITH DIFFERENTIAL/PLATELET
Basophils Absolute: 0 10*3/uL (ref 0–0.1)
Basophils Relative: 0 %
Eosinophils Absolute: 0 10*3/uL (ref 0–0.7)
Eosinophils Relative: 0 %
HCT: 39 % (ref 35.0–47.0)
Hemoglobin: 12.9 g/dL (ref 12.0–16.0)
Lymphocytes Relative: 10 %
Lymphs Abs: 1 10*3/uL (ref 1.0–3.6)
MCH: 30 pg (ref 26.0–34.0)
MCHC: 33.1 g/dL (ref 32.0–36.0)
MCV: 90.5 fL (ref 80.0–100.0)
Monocytes Absolute: 0.7 10*3/uL (ref 0.2–0.9)
Monocytes Relative: 7 %
Neutro Abs: 8.7 10*3/uL — ABNORMAL HIGH (ref 1.4–6.5)
Neutrophils Relative %: 83 %
Platelets: 219 10*3/uL (ref 150–440)
RBC: 4.31 MIL/uL (ref 3.80–5.20)
RDW: 14.5 % (ref 11.5–14.5)
WBC: 10.5 10*3/uL (ref 3.6–11.0)

## 2017-06-26 LAB — URINALYSIS, COMPLETE (UACMP) WITH MICROSCOPIC
Bacteria, UA: NONE SEEN
Bilirubin Urine: NEGATIVE
Glucose, UA: NEGATIVE mg/dL
Hgb urine dipstick: NEGATIVE
Ketones, ur: NEGATIVE mg/dL
Leukocytes, UA: NEGATIVE
Nitrite: NEGATIVE
Protein, ur: NEGATIVE mg/dL
Specific Gravity, Urine: 1.016 (ref 1.005–1.030)
Squamous Epithelial / LPF: NONE SEEN
pH: 6 (ref 5.0–8.0)

## 2017-06-26 LAB — COMPREHENSIVE METABOLIC PANEL
ALT: 20 U/L (ref 14–54)
AST: 28 U/L (ref 15–41)
Albumin: 4.3 g/dL (ref 3.5–5.0)
Alkaline Phosphatase: 71 U/L (ref 38–126)
Anion gap: 13 (ref 5–15)
BUN: 26 mg/dL — ABNORMAL HIGH (ref 6–20)
CO2: 26 mmol/L (ref 22–32)
Calcium: 9.3 mg/dL (ref 8.9–10.3)
Chloride: 103 mmol/L (ref 101–111)
Creatinine, Ser: 0.64 mg/dL (ref 0.44–1.00)
GFR calc Af Amer: >60 mL/min (ref >60)
GFR calc non Af Amer: >60 mL/min (ref >60)
Glucose, Bld: 143 mg/dL — ABNORMAL HIGH (ref 65–99)
Potassium: 3.1 mmol/L — ABNORMAL LOW (ref 3.5–5.1)
Sodium: 142 mmol/L (ref 135–145)
Total Bilirubin: 0.8 mg/dL (ref 0.3–1.2)
Total Protein: 7.2 g/dL (ref 6.5–8.1)

## 2017-06-26 LAB — CK: Total CK: 164 U/L (ref 38–234)

## 2017-06-26 IMAGING — CR DG CHEST 2V
2 series · 2 of 2 positions shown · non-contrast
Comparison: Report [DATE]

CLINICAL DATA: Fall with chest wall pain

EXAM:
CHEST - 2 VIEW

[chest lat]
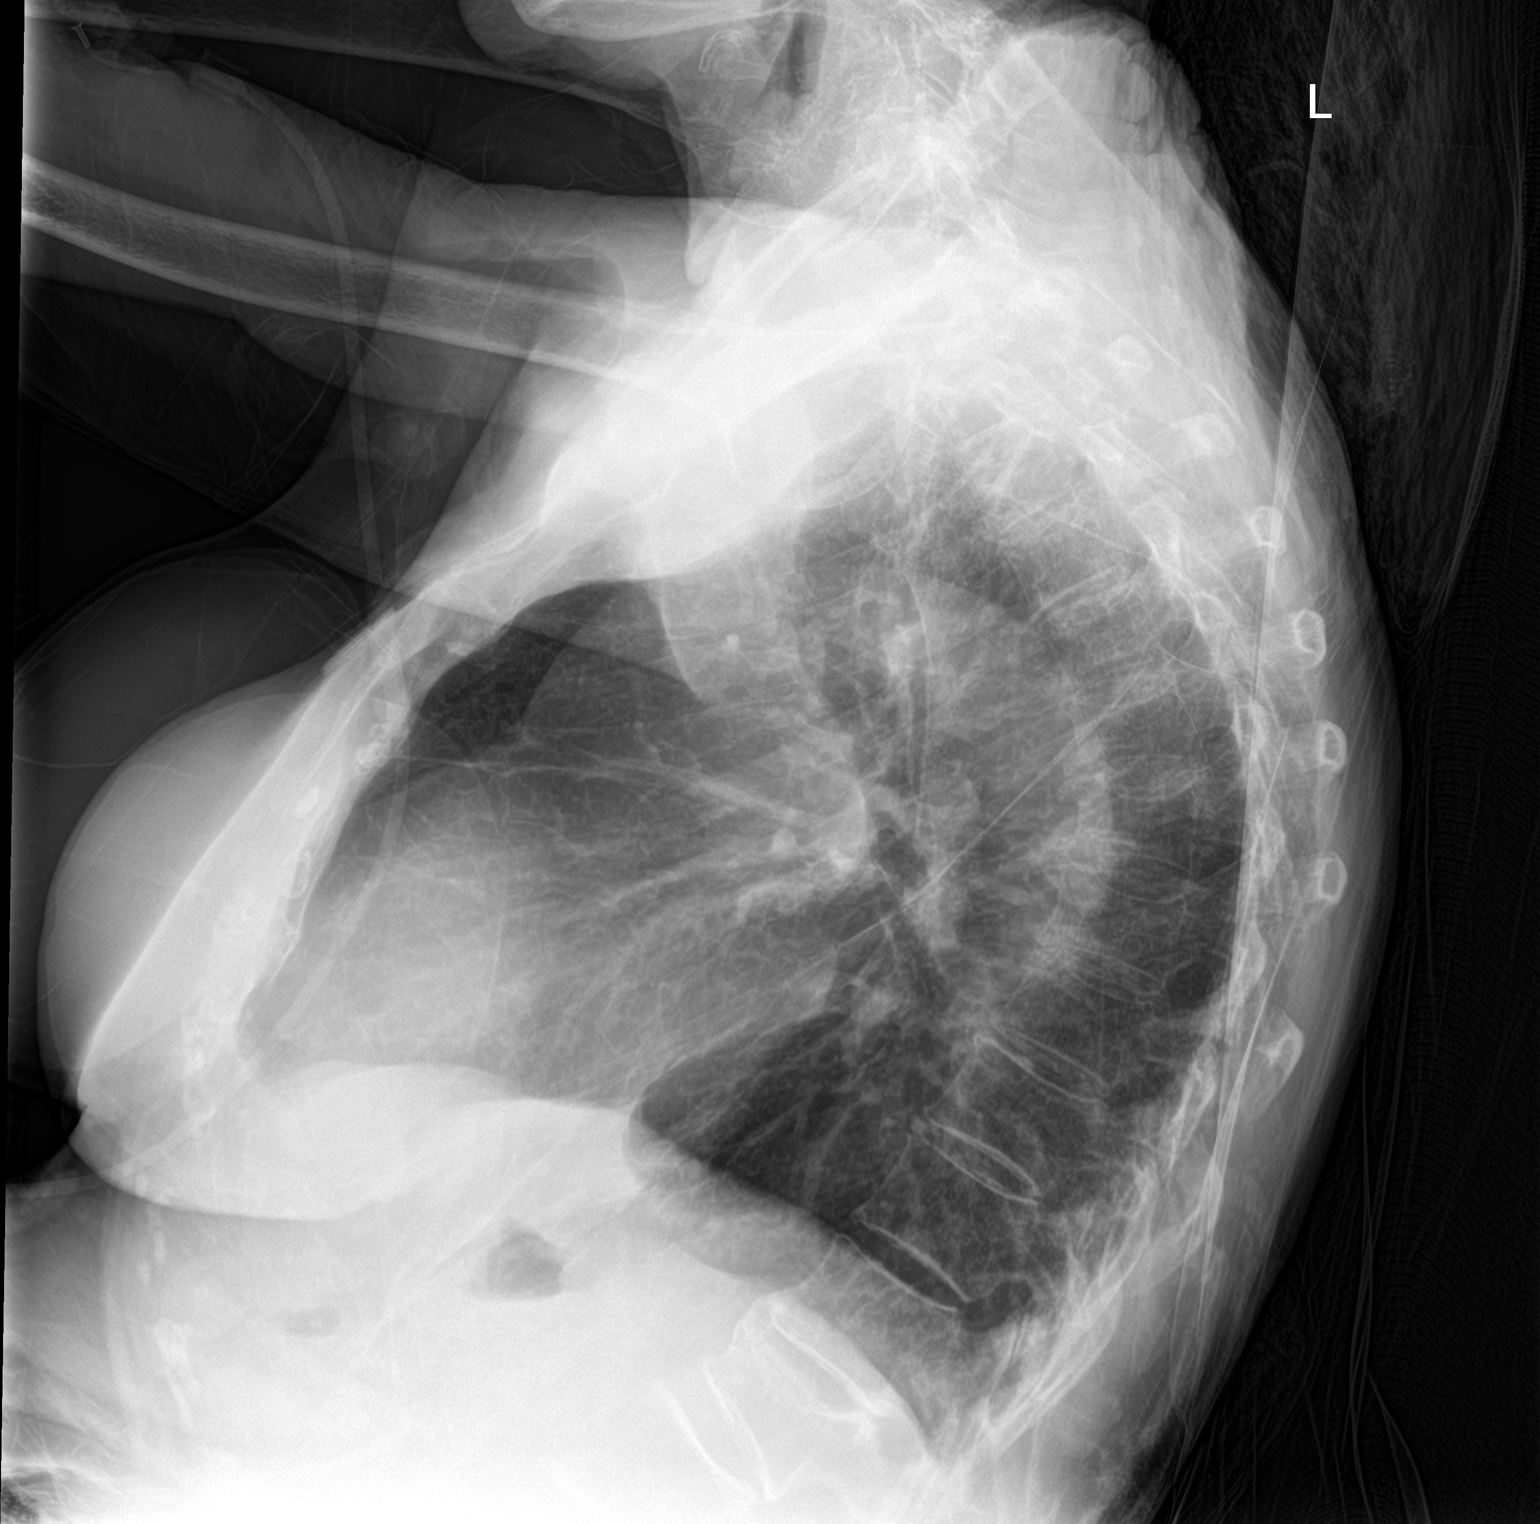

[chest ap]
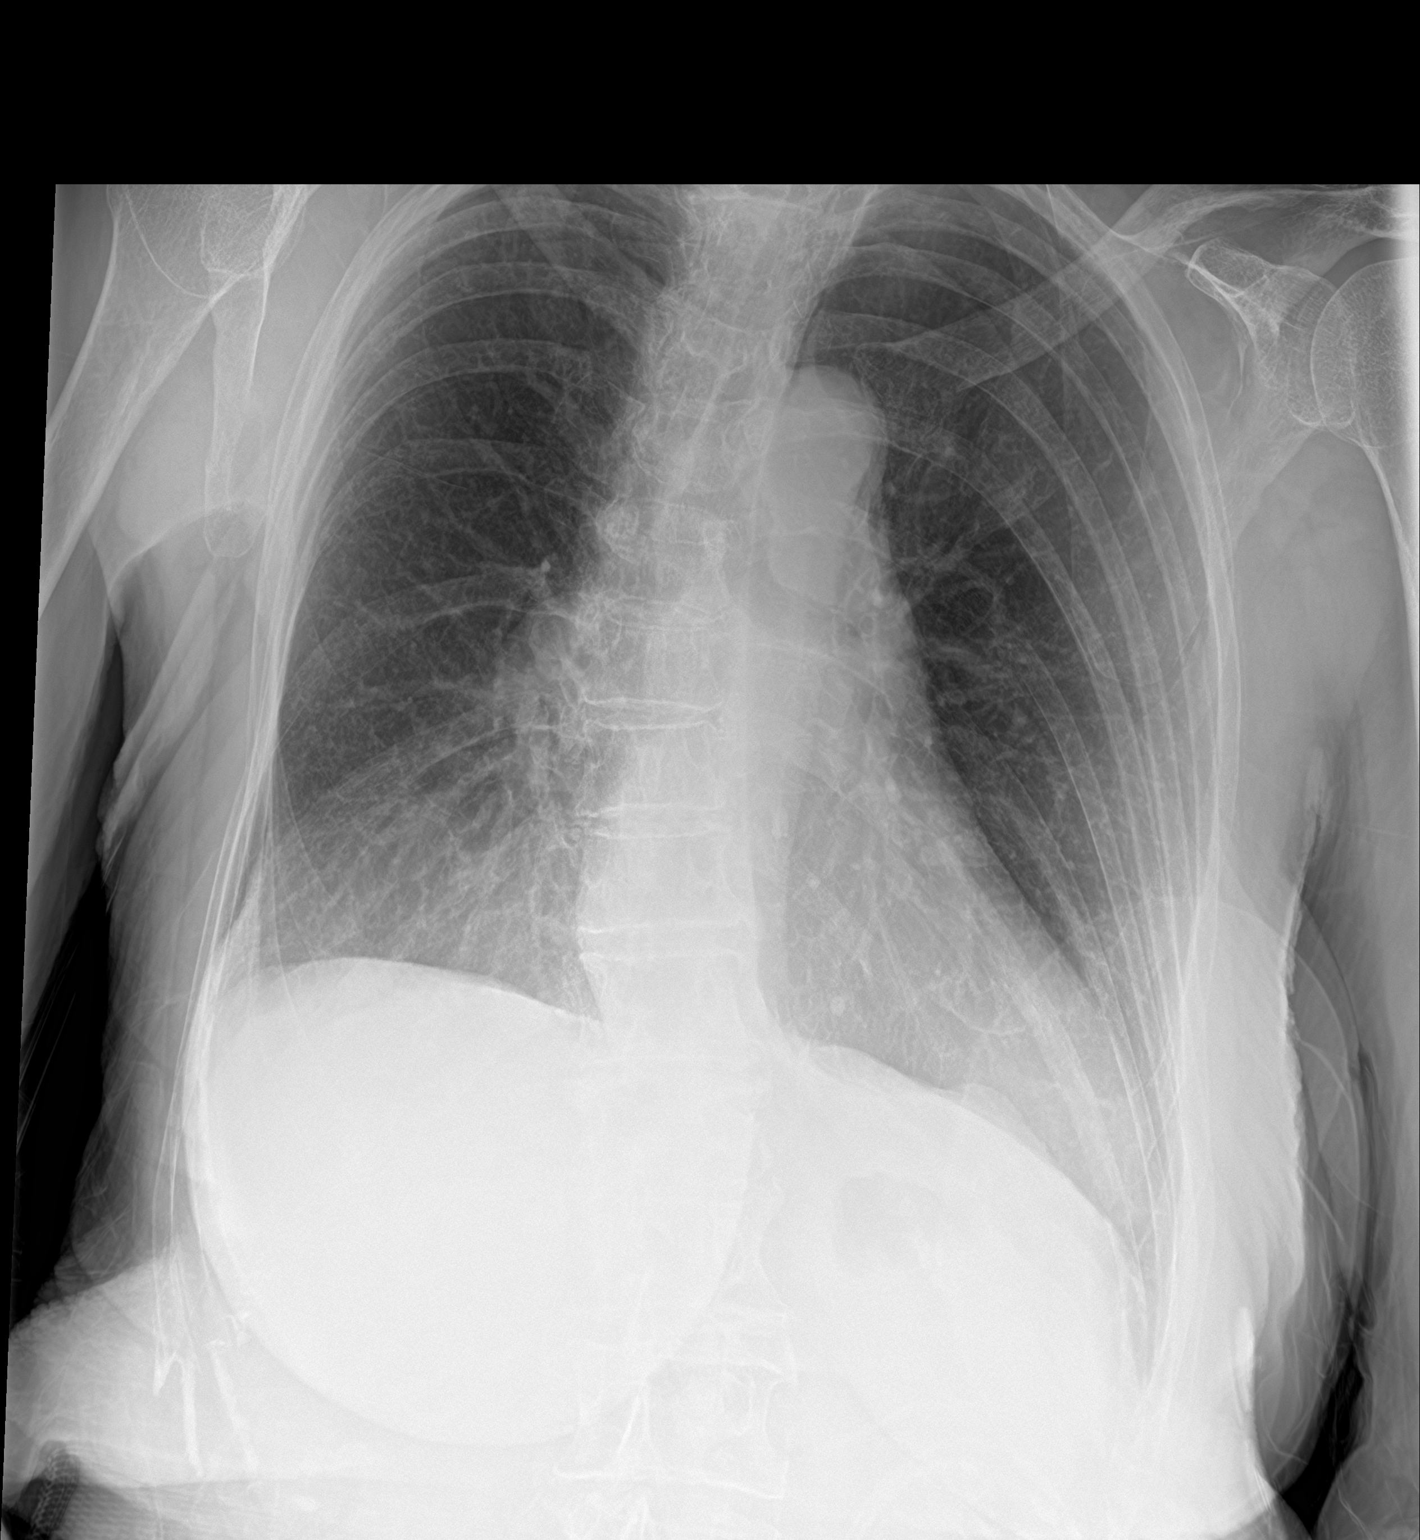

[2 of 2 positions shown; findings below may reference images not displayed]

FINDINGS: Mild scoliosis of the spine. No focal consolidation. No
pneumothorax. Possible tiny right pleural effusion.
Cardiomediastinal silhouette within normal limits. No pneumothorax.
Age indeterminate compression deformities of midthoracic vertebra.
Moderate superior endplate compression deformity at the
thoracolumbar junction.
IMPRESSION: 1. Negative for pneumothorax.  Possible tiny right pleural effusion.
2. Age indeterminate compression deformities of midthoracic vertebra
and superior endplate deformity at the thoracolumbar junction.

## 2017-06-26 IMAGING — CR DG HAND COMPLETE 3+V*L*
3 series · 3 of 3 positions shown · non-contrast
Comparison: None

CLINICAL DATA: Fall

EXAM:
LEFT HAND - COMPLETE 3+ VIEW

[hand ap]
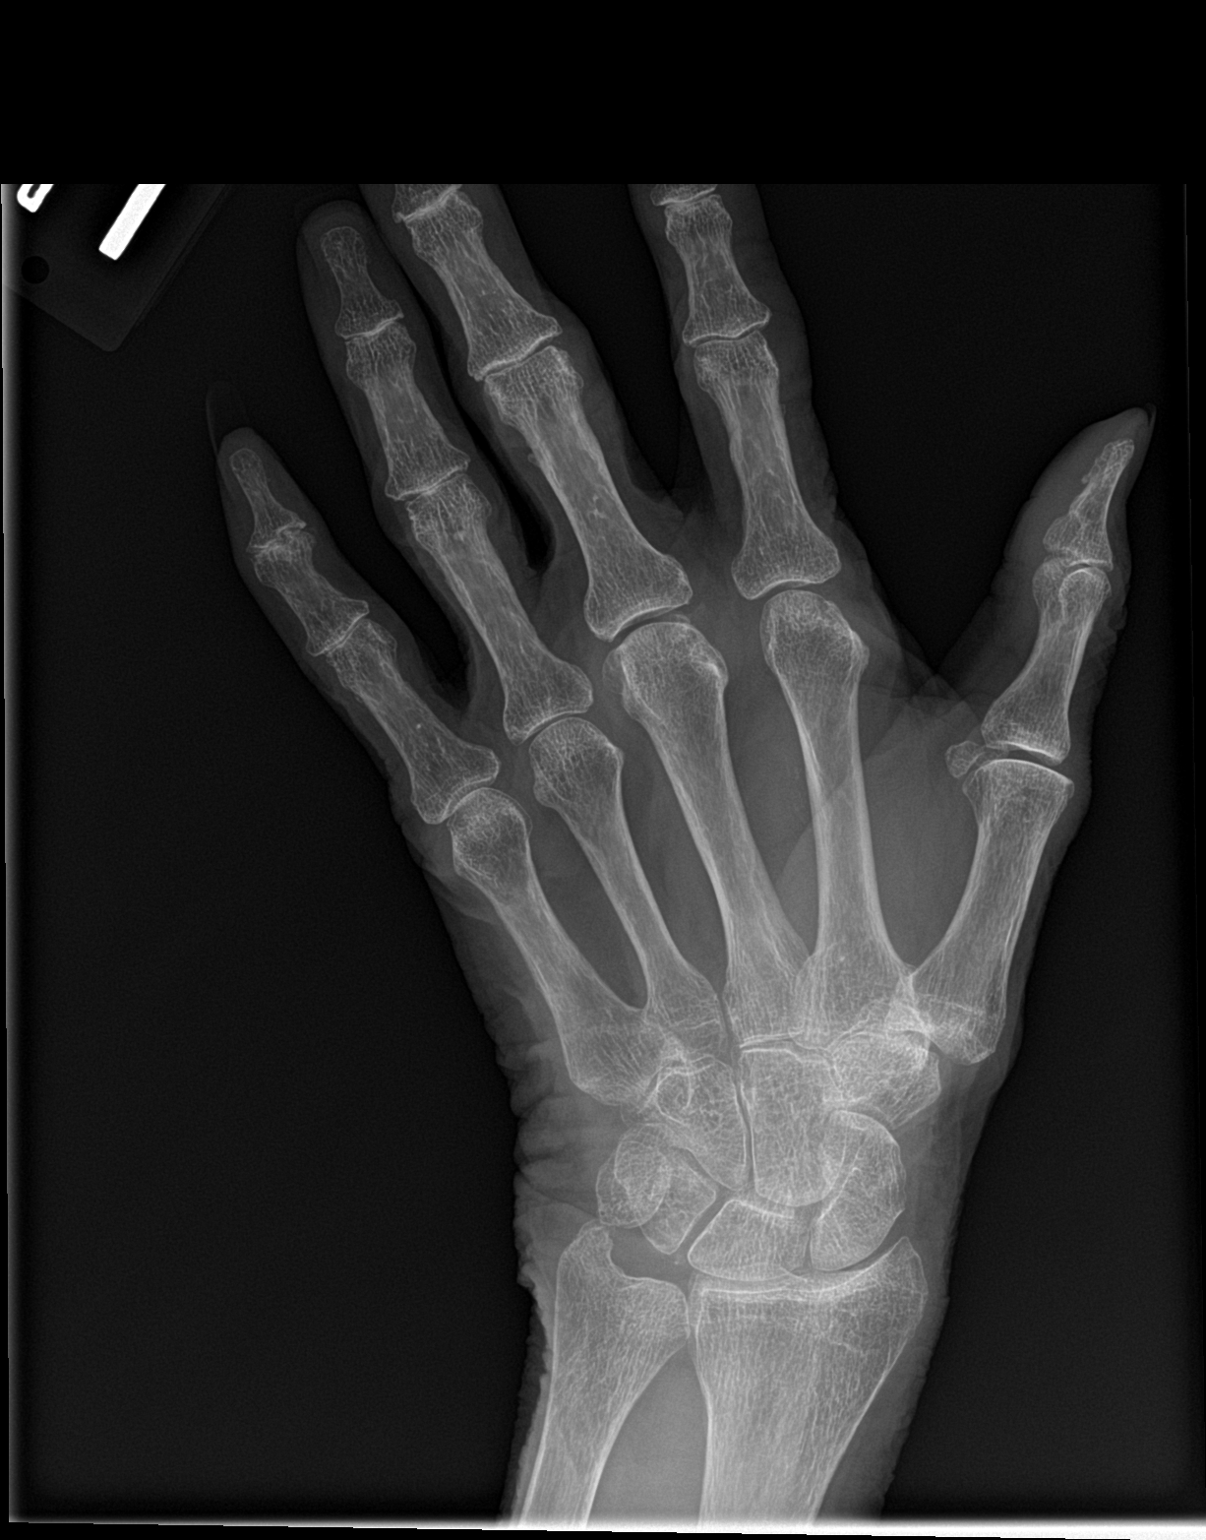

[hand obl]
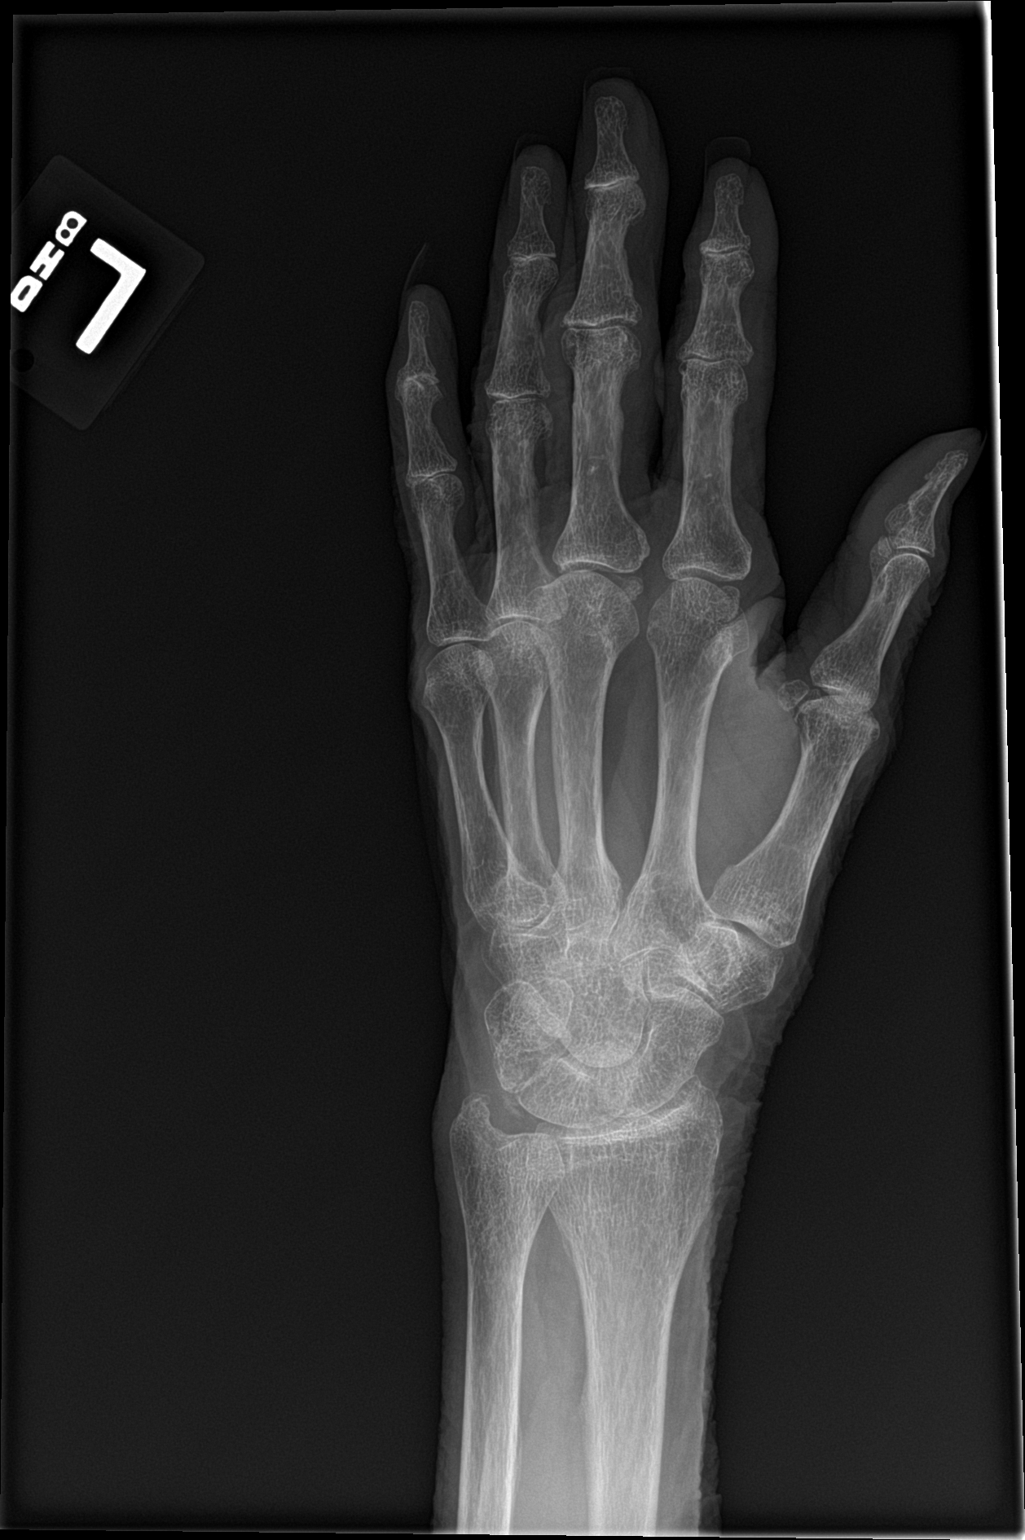

[hand lat]
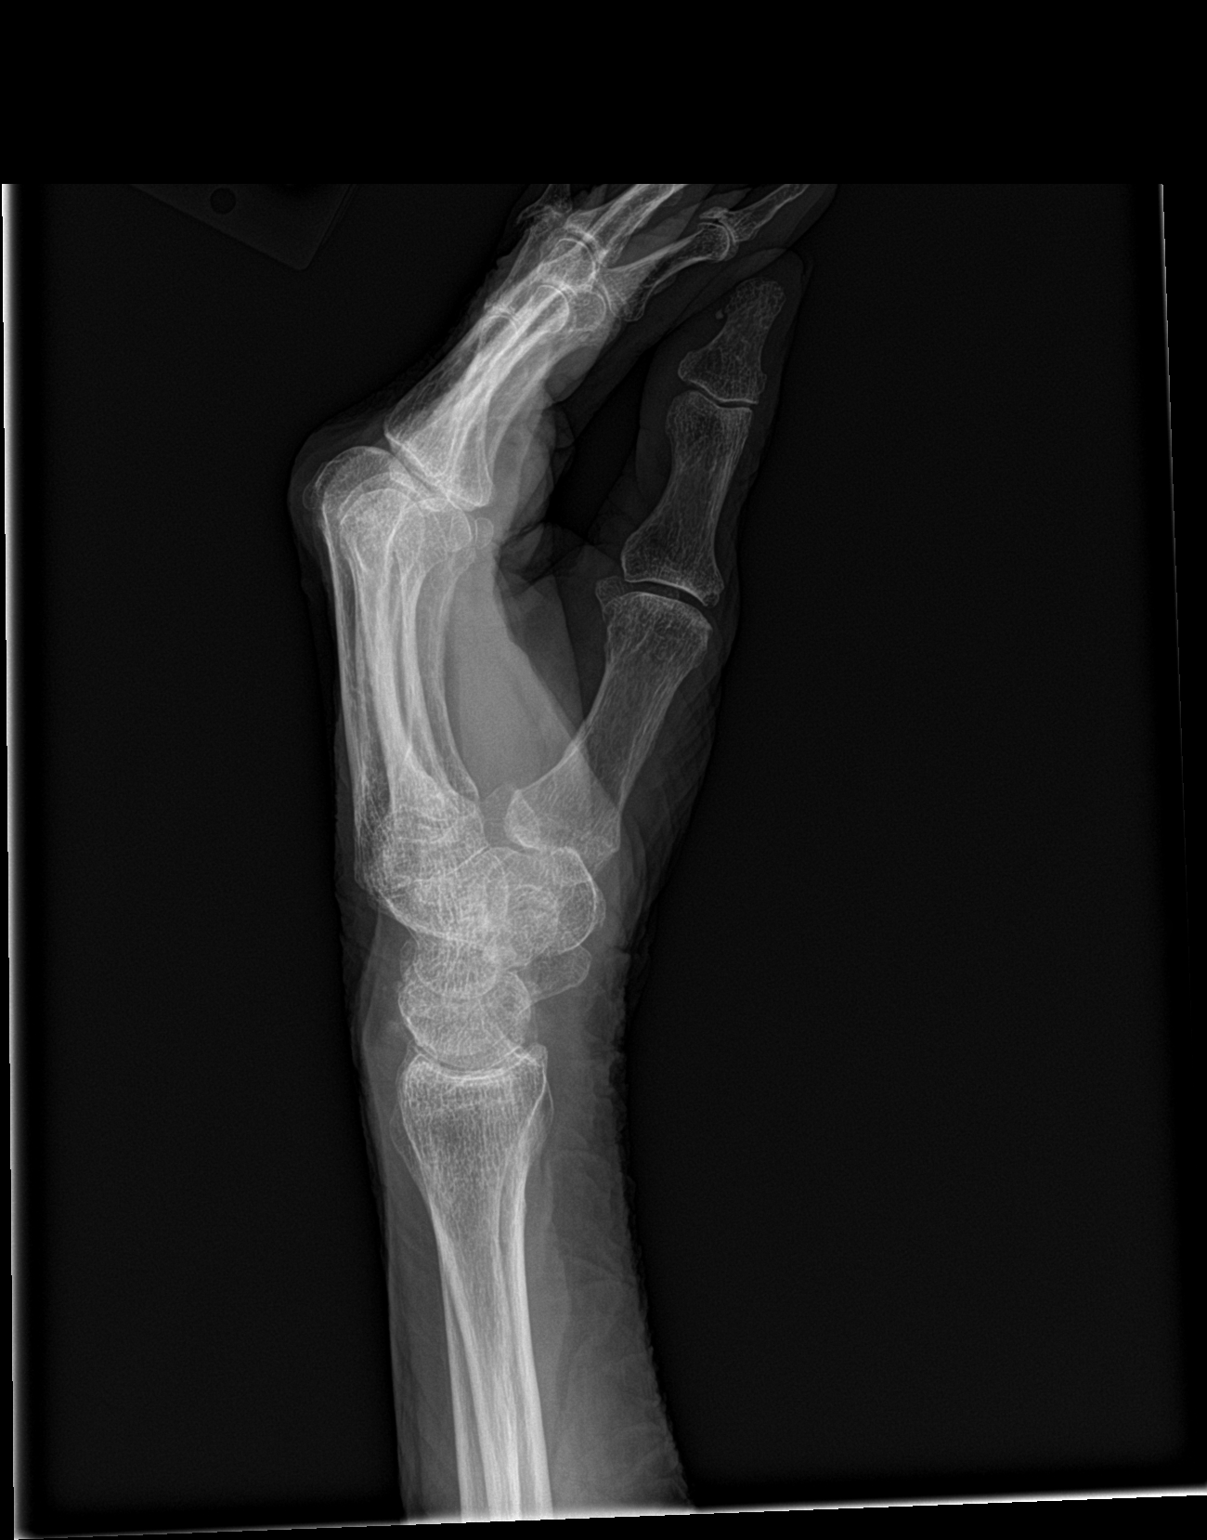

[3 of 3 positions shown; findings below may reference images not displayed]

FINDINGS: Marked osseous demineralization.

Scattered narrowing of interphalangeal joints, fourth and fifth MCP
joints, and mildly radiocarpal joint.

Questionable nondisplaced fracture through a spur at the dorsal
margin, base of distal phalanx LEFT index finger.

No additional fracture, dislocation or bone destruction.

Chondrocalcinosis at triangular fibrocartilage question CPPD.
IMPRESSION: Osseous demineralization with mild scattered degenerative changes
and question CPPD.

Question nondisplaced fracture through a spur at the base of the
distal phalanx LEFT index finger dorsally.

## 2017-06-26 IMAGING — CT CT HEAD W/O CM
3 series · 15 of 45 positions shown, 18 images · non-contrast
Comparison: [DATE]

CLINICAL DATA: Fall.  Head trauma.  Ataxia.  Initial encounter.

EXAM:
CT HEAD WITHOUT CONTRAST
TECHNIQUE: Contiguous axial images were obtained from the base of the skull
through the vertex without intravenous contrast.

[Series 2: head wo · axial · 0.41mm/px · z∈[+414,+529]mm · 9 of 28 slices shown, 12 images]
[im 3/28  brain]
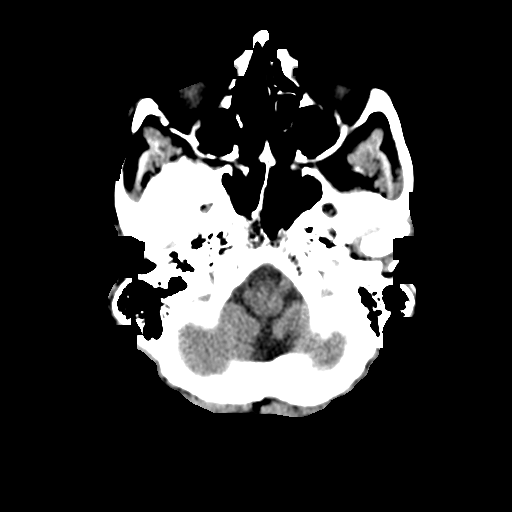
[im 3/28  bone]
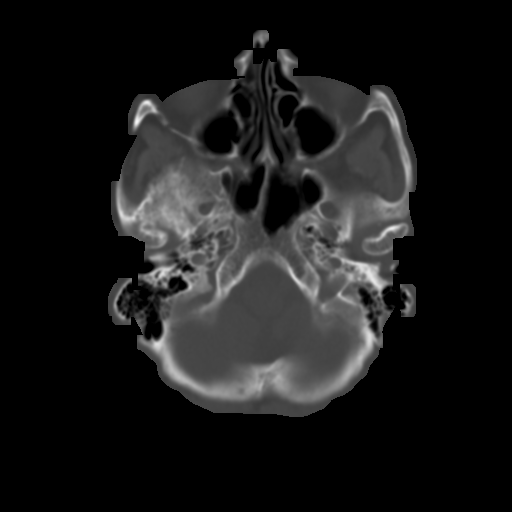
[im 6/28  brain]
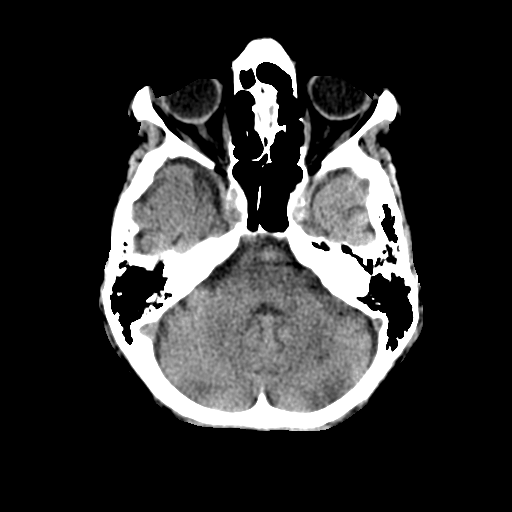
[im 9/28  brain]
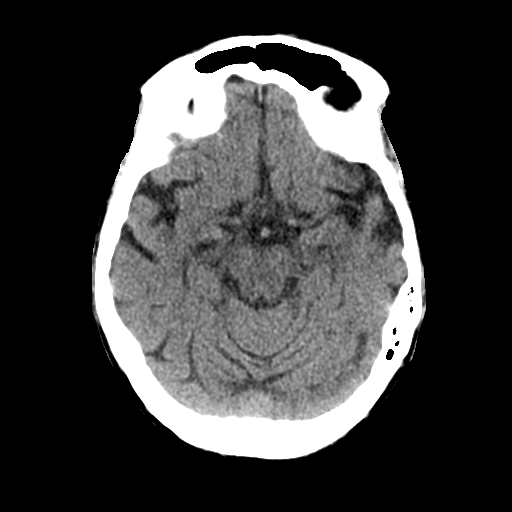
[im 12/28  brain]
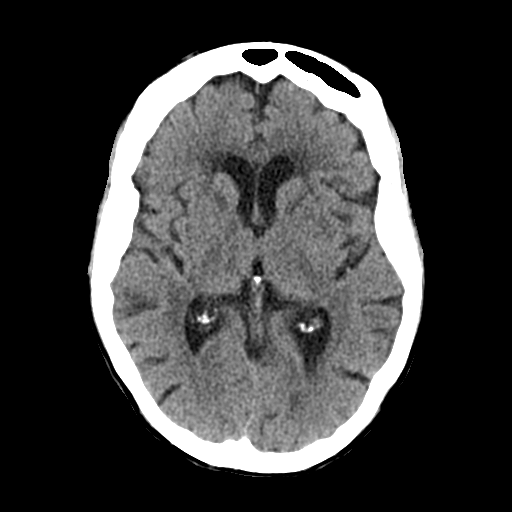
[im 15/28  brain]
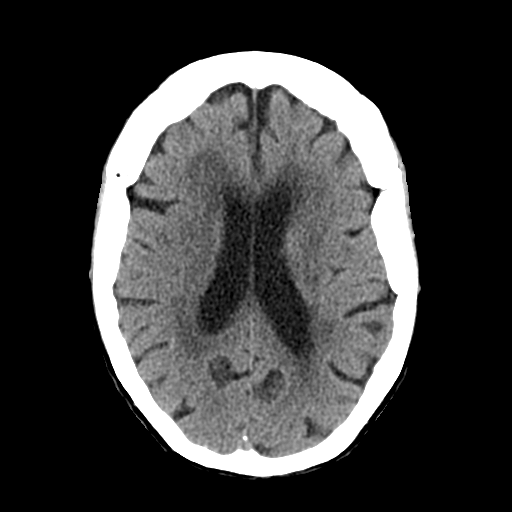
[im 15/28  bone]
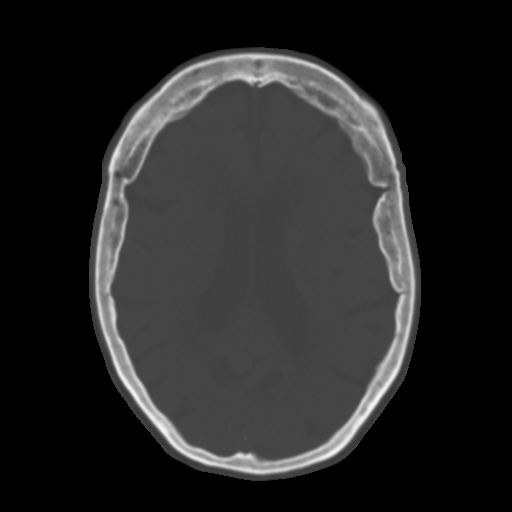
[im 17/28  brain]
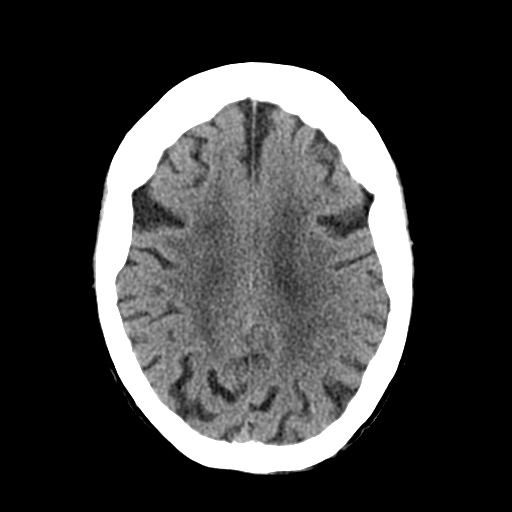
[im 20/28  brain]
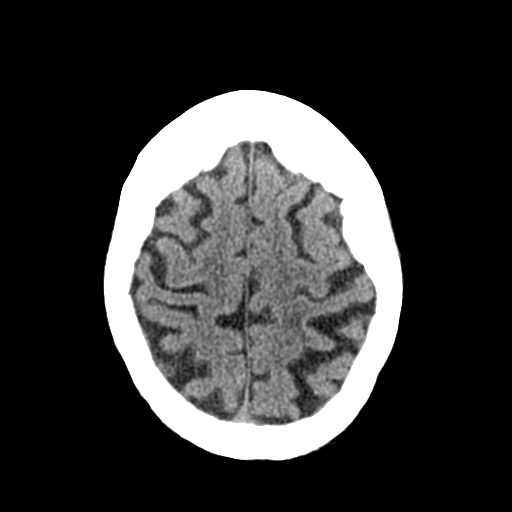
[im 23/28  brain]
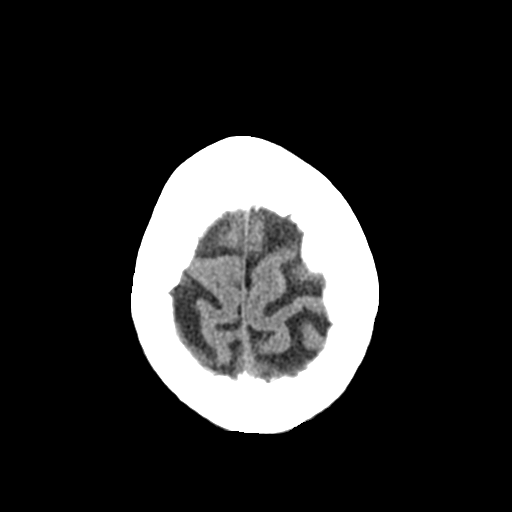
[im 26/28  brain]
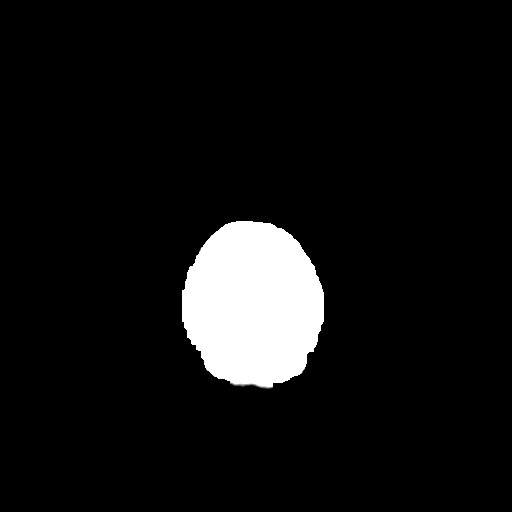
[im 26/28  bone]
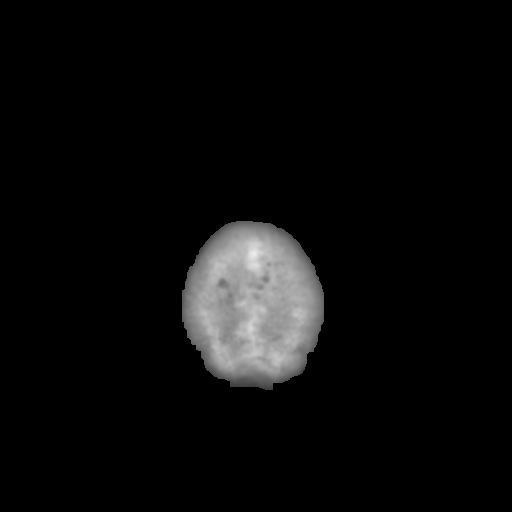

[Series 4: coronal soft tissue · coronal · 0.30mm/px · 3 of 61 slices shown]
[im 21/61  brain]
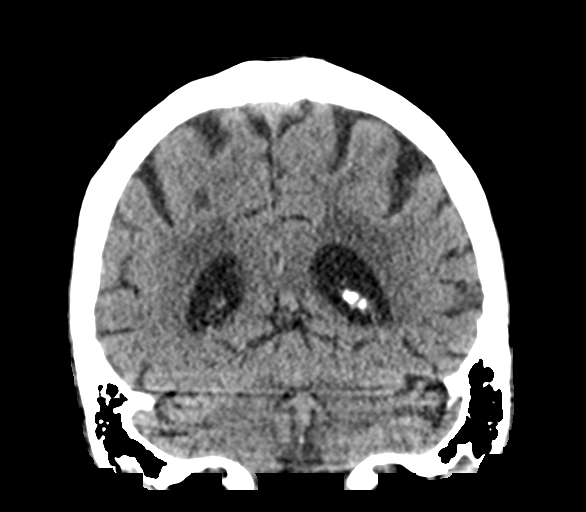
[im 27/61  brain]
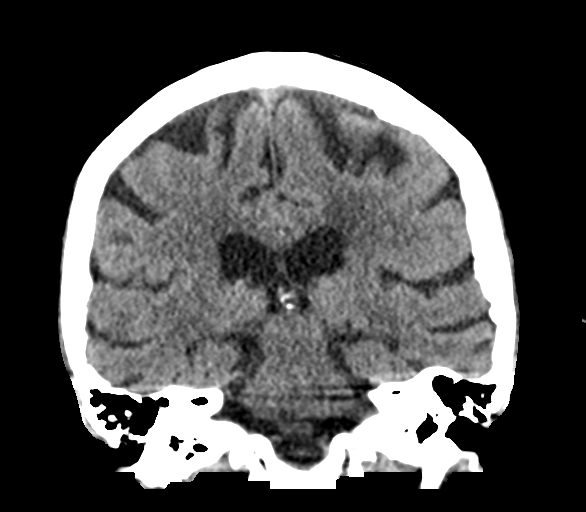
[im 34/61  brain]
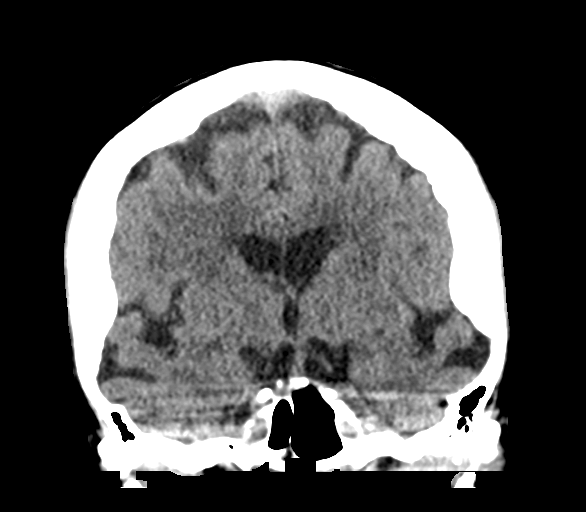

[Series 5: sagittal soft tissue · sagittal · 0.27mm/px · 3 of 48 slices shown]
[im 16/48  brain]
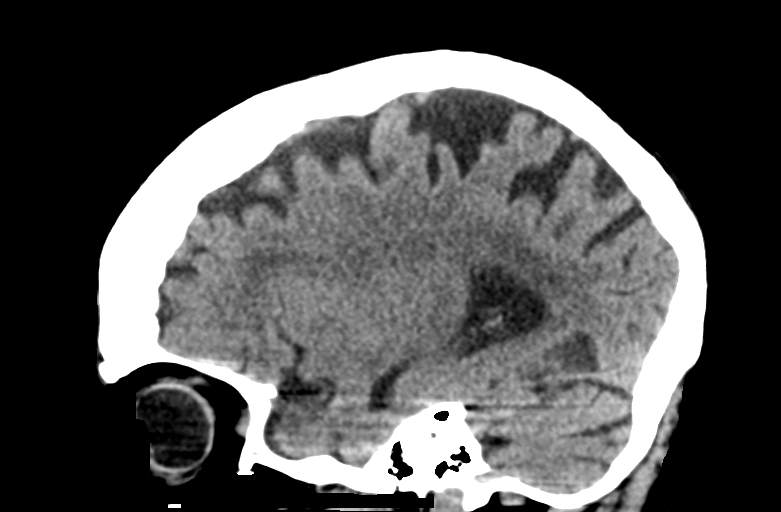
[im 24/48  brain]
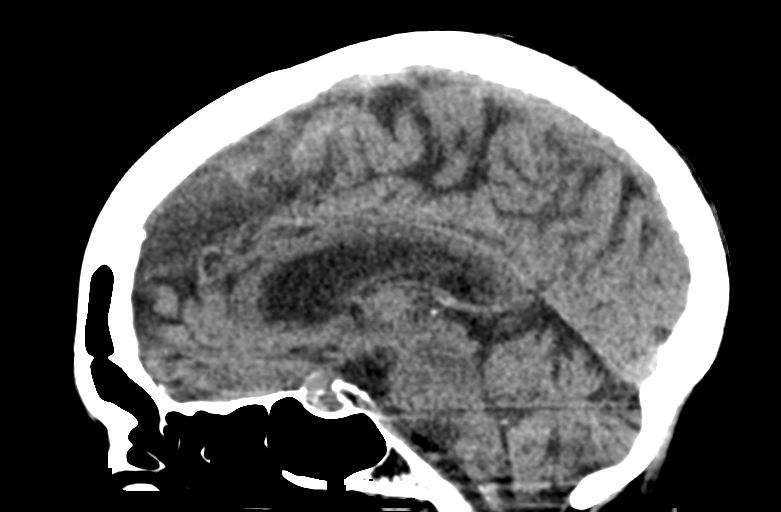
[im 32/48  brain]
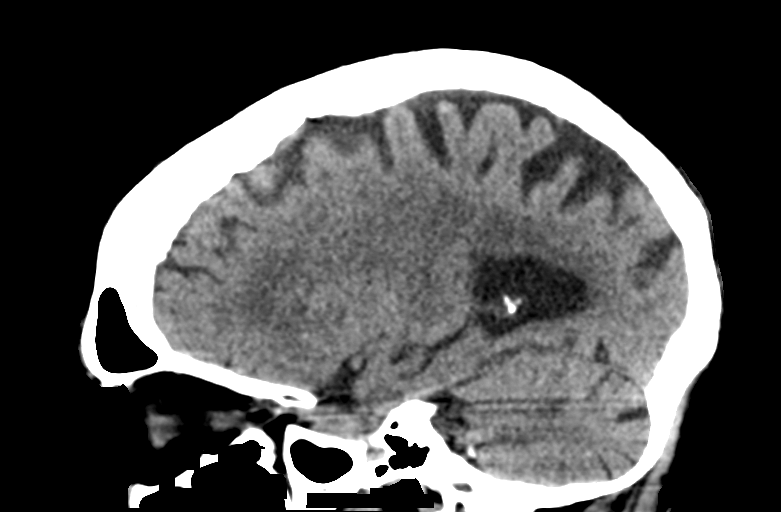

[15 of 45 positions shown; findings below may reference images not displayed]

FINDINGS: Brain: There is no evidence of acute infarct, intracranial
hemorrhage, mass, midline shift, or extra-axial fluid collection. A
small chronic infarct in the left cerebellum is unchanged. Cerebral
white matter hypodensities may have mildly progressed and are
nonspecific but compatible with moderate chronic small vessel
ischemic disease. Mild cerebral atrophy is mildly progressed but is
not greater than expected for patient's age.

Vascular: Calcified atherosclerosis at the skull base. No hyperdense
vessel.

Skull: No fracture or focal osseous lesion.

Sinuses/Orbits: Visualized paranasal sinuses and mastoid air cells
are clear. Bilateral cataract extraction is noted.

Other: None.
IMPRESSION: 1. No evidence of acute intracranial abnormality.
2. Moderate chronic small vessel ischemic disease and old left
cerebellar infarct.

## 2017-06-26 MED ORDER — SODIUM CHLORIDE 0.9 % IV BOLUS (SEPSIS)
500.0000 mL | Freq: Once | INTRAVENOUS | Status: AC
  Administered 2017-06-26: 500 mL via INTRAVENOUS

## 2017-06-26 NOTE — ED Provider Notes (Signed)
[facility] Emergency Department Provider Note    None    (approximate)  I have reviewed the triage vital signs and the nursing notes.   HISTORY  Chief Complaint Fall  Level v caveat:  dementia  HPI Elizabeth [name] is a 84 y.o. female presents after mechanical fall.  Patient was found outside of her home.  Nephew resents to the ER with patient.  Currently complaining of right flank pain as well as left wrist pain after the fall.  Does not recall any LOC.  Uncertain of downtime but possibly up to one hour.  Denies any abdominal pain.  No chest pain or shortness of breath.  She denies hitting her head.  Not currently on any blood thinners.  No past medical history on file. No family history on file.  There are no active problems to display for this patient.     Prior to Admission medications   Not on File    Allergies Patient has no allergy information on record.    Social History Social History   Tobacco Use  . Smoking status: Not on file  Substance Use Topics  . Alcohol use: Not on file  . Drug use: Not on file    Review of Systems Patient denies headaches, rhinorrhea, blurry vision, numbness, shortness of breath, chest pain, edema, cough, abdominal pain, nausea, vomiting, diarrhea, dysuria, fevers, rashes or hallucinations unless otherwise stated above in HPI. ____________________________________________   PHYSICAL EXAM:  VITAL SIGNS: Vitals:   06/26/17 1850  BP: (!) 143/84  Pulse: 81  Resp: 19  Temp: 97.9 F (36.6 C)  SpO2: 100%    Constitutional: Alert chronically ill appearing, in no acute distress. Eyes: Conjunctivae are normal.  Head: Atraumatic. Nose: No congestion/rhinnorhea. Mouth/Throat: Mucous membranes are moist.   Neck: No stridor. Painless ROM.  Cardiovascular: Normal rate, regular rhythm. Grossly normal heart sounds.  Good peripheral circulation. Respiratory: Normal respiratory effort.  No  retractions. Lungs CTAB. Gastrointestinal: Soft and nontender. No distention. No abdominal bruits. No CVA tenderness. Genitourinary: Musculoskeletal: superficial abrasion to right flank and chest wall, likely from where she was laying on ground as compared to true abrasion injury. There is mild tenderness palpation of the left wrist.  No snuffbox tenderness.  SILT distally. no deformity no lower extremity tenderness nor edema.  No joint effusions. Neurologic:  Normal speech and language. No gross focal neurologic deficits are appreciated. No facial droop Skin:  Skin is warm, dry and intact. No rash noted. Psychiatric: Mood and affect are normal. Speech and behavior are normal.  ____________________________________________   LABS (all labs ordered are listed, but only abnormal results are displayed)  Results for orders placed or performed during the hospital encounter of 06/26/17 (from the past 24 hour(s))  CBC with Differential/Platelet     Status: Abnormal   Collection Time: 06/26/17  6:54 PM  Result Value Ref Range   WBC 10.5 3.6 - 11.0 K/uL   RBC 4.31 3.80 - 5.20 MIL/uL   Hemoglobin 12.9 12.0 - 16.0 g/dL   HCT 16.139.0 09.635.0 - 04.547.0 %   MCV 90.5 80.0 - 100.0 fL   MCH 30.0 26.0 - 34.0 pg   MCHC 33.1 32.0 - 36.0 g/dL   RDW 40.914.5 81.111.5 - 91.414.5 %   Platelets 219 150 - 440 K/uL   Neutrophils Relative % 83 %   Neutro Abs 8.7 (H) 1.4 - 6.5 K/uL   Lymphocytes Relative 10 %   Lymphs Abs 1.0 1.0 -  3.6 K/uL   Monocytes Relative 7 %   Monocytes Absolute 0.7 0.2 - 0.9 K/uL   Eosinophils Relative 0 %   Eosinophils Absolute 0.0 0 - 0.7 K/uL   Basophils Relative 0 %   Basophils Absolute 0.0 0 - 0.1 K/uL  CK     Status: None   Collection Time: 06/26/17  6:54 PM  Result Value Ref Range   Total CK 164 38 - 234 U/L  Comprehensive metabolic panel     Status: Abnormal   Collection Time: 06/26/17  6:54 PM  Result Value Ref Range   Sodium 142 135 - 145 mmol/L   Potassium 3.1 (L) 3.5 - 5.1 mmol/L    Chloride 103 101 - 111 mmol/L   CO2 26 22 - 32 mmol/L   Glucose, Bld 143 (H) 65 - 99 mg/dL   BUN 26 (H) 6 - 20 mg/dL   Creatinine, Ser 1.61 0.44 - 1.00 mg/dL   Calcium 9.3 8.9 - 09.6 mg/dL   Total Protein 7.2 6.5 - 8.1 g/dL   Albumin 4.3 3.5 - 5.0 g/dL   AST 28 15 - 41 U/L   ALT 20 14 - 54 U/L   Alkaline Phosphatase 71 38 - 126 U/L   Total Bilirubin 0.8 0.3 - 1.2 mg/dL   GFR calc non Af Amer >60 >60 mL/min   GFR calc Af Amer >60 >60 mL/min   Anion gap 13 5 - 15   ____________________________________________  EKG My review and personal interpretation at Time: 18:52   Indication: fall  Rate: 75  Rhythm: sinus Axis: normal  Other: no stemi, normal intervals ____________________________________________  RADIOLOGY  I personally reviewed all radiographic images ordered to evaluate for the above acute complaints and reviewed radiology reports and findings.  These findings were personally discussed with the patient.  Please see medical record for radiology report.  ____________________________________________   PROCEDURES  Procedure(s) performed:  Procedures    Critical Care performed: no ____________________________________________   INITIAL IMPRESSION / ASSESSMENT AND PLAN / ED COURSE  Pertinent labs & imaging results that were available during my care of the patient were reviewed by me and considered in my medical decision making (see chart for details).  DDX: Fracture contusion traumatic brain injury.  Dehydration, AK I, UTI  Elizabeth SAGGESE is a 84 y.o. who presents to the ED with fall being found down as described above.  With her extensive dementia and uncertain etiology of her fall blood work and radiographs ordered for the above differential.  CT head shows no evidence of acute intracranial abnormality.  There is no evidence of fracture.  Questionable distal left talus fracture unlikely clinically as she has no pain at that location.  Snuffbox tenderness to  suggest scaphoid fracture.  Abdominal exam is soft and benign.  Blood work otherwise reassuring.  Still awaiting urinary workup.  The patient will be signed out to oncoming physician pending urinalysis.  Anticipate discharge home.      As part of my medical decision making, I reviewed the following data within the electronic MEDICAL RECORD NUMBER Nursing notes reviewed and incorporated, Labs reviewed, notes from prior ED visits.   ____________________________________________   FINAL CLINICAL IMPRESSION(S) / ED DIAGNOSES  Final diagnoses:  Fall, initial encounter  Dementia without behavioral disturbance, unspecified dementia type  Hand pain, left      NEW MEDICATIONS STARTED DURING THIS VISIT:  New Prescriptions   No medications on file     Note:  This document was  prepared using Conservation officer, historic buildings and may include unintentional dictation errors.    Willy Eddy, MD 06/26/17 2034

## 2017-06-26 NOTE — ED Notes (Signed)
Called Floreen ComberRebecca Miles legal guardian of pt and left message notifying her of plan of care for pt

## 2017-06-26 NOTE — ED Notes (Signed)
Pt assisted to toilet 

## 2017-06-26 NOTE — ED Notes (Signed)
Pt's nephew at bedside.

## 2017-06-26 NOTE — ED Triage Notes (Signed)
Pt presents today via [facility] for fall. Pt was found outside for [place] amount of time. Lurena JoinerRebecca POA on the way. Pt is A/ox2.

## 2017-06-26 NOTE — ED Notes (Signed)
Called lab and spoke with Morrell RiddleKira and she stated she had the blood in her hand and would process

## 2017-06-26 NOTE — Discharge Instructions (Signed)
Please seek medical attention for any high fevers, chest pain, shortness of breath, change in behavior, persistent vomiting, bloody stool or any other new or concerning symptoms.  

## 2017-06-26 NOTE — ED Notes (Signed)
Pt leaving to go home with legal guardian Lurena JoinerRebecca.

## 2019-09-02 ENCOUNTER — Emergency Department: Payer: Medicare HMO

## 2019-09-02 ENCOUNTER — Other Ambulatory Visit: Payer: Self-pay

## 2019-09-02 ENCOUNTER — Encounter: Payer: Self-pay | Admitting: Emergency Medicine

## 2019-09-02 ENCOUNTER — Encounter
Admission: EM | Disposition: A | Discharge status: Skilled Nursing Facility | Payer: Self-pay | Source: Home / Self Care | Attending: Internal Medicine

## 2019-09-02 ENCOUNTER — Inpatient Hospital Stay
Admission: EM | Admit: 2019-09-02 | Discharge: 2019-09-16 | DRG: 246 | Disposition: A | Discharge status: Skilled Nursing Facility | Payer: Medicare HMO | Attending: Internal Medicine | Admitting: Internal Medicine

## 2019-09-02 DIAGNOSIS — E43 Unspecified severe protein-calorie malnutrition: Secondary | ICD-10-CM | POA: Insufficient documentation

## 2019-09-02 DIAGNOSIS — I2111 ST elevation (STEMI) myocardial infarction involving right coronary artery: Principal | ICD-10-CM | POA: Diagnosis present

## 2019-09-02 DIAGNOSIS — D649 Anemia, unspecified: Secondary | ICD-10-CM | POA: Diagnosis present

## 2019-09-02 DIAGNOSIS — R531 Weakness: Secondary | ICD-10-CM | POA: Diagnosis not present

## 2019-09-02 DIAGNOSIS — W1830XA Fall on same level, unspecified, initial encounter: Secondary | ICD-10-CM | POA: Diagnosis present

## 2019-09-02 DIAGNOSIS — F0390 Unspecified dementia without behavioral disturbance: Secondary | ICD-10-CM | POA: Diagnosis present

## 2019-09-02 DIAGNOSIS — R55 Syncope and collapse: Secondary | ICD-10-CM | POA: Diagnosis not present

## 2019-09-02 DIAGNOSIS — R197 Diarrhea, unspecified: Secondary | ICD-10-CM | POA: Diagnosis not present

## 2019-09-02 DIAGNOSIS — I959 Hypotension, unspecified: Secondary | ICD-10-CM | POA: Diagnosis not present

## 2019-09-02 DIAGNOSIS — I213 ST elevation (STEMI) myocardial infarction of unspecified site: Secondary | ICD-10-CM | POA: Diagnosis not present

## 2019-09-02 DIAGNOSIS — E785 Hyperlipidemia, unspecified: Secondary | ICD-10-CM | POA: Diagnosis present

## 2019-09-02 DIAGNOSIS — Y92014 Private driveway to single-family (private) house as the place of occurrence of the external cause: Secondary | ICD-10-CM

## 2019-09-02 DIAGNOSIS — S5012XA Contusion of left forearm, initial encounter: Secondary | ICD-10-CM | POA: Diagnosis present

## 2019-09-02 DIAGNOSIS — I69351 Hemiplegia and hemiparesis following cerebral infarction affecting right dominant side: Secondary | ICD-10-CM

## 2019-09-02 DIAGNOSIS — I10 Essential (primary) hypertension: Secondary | ICD-10-CM | POA: Diagnosis present

## 2019-09-02 DIAGNOSIS — Z20822 Contact with and (suspected) exposure to covid-19: Secondary | ICD-10-CM | POA: Diagnosis present

## 2019-09-02 DIAGNOSIS — I2510 Atherosclerotic heart disease of native coronary artery without angina pectoris: Secondary | ICD-10-CM | POA: Diagnosis present

## 2019-09-02 DIAGNOSIS — R1310 Dysphagia, unspecified: Secondary | ICD-10-CM | POA: Diagnosis present

## 2019-09-02 DIAGNOSIS — I6782 Cerebral ischemia: Secondary | ICD-10-CM | POA: Diagnosis present

## 2019-09-02 DIAGNOSIS — W19XXXA Unspecified fall, initial encounter: Secondary | ICD-10-CM

## 2019-09-02 DIAGNOSIS — D72829 Elevated white blood cell count, unspecified: Secondary | ICD-10-CM | POA: Diagnosis present

## 2019-09-02 DIAGNOSIS — E876 Hypokalemia: Secondary | ICD-10-CM | POA: Diagnosis present

## 2019-09-02 DIAGNOSIS — F05 Delirium due to known physiological condition: Secondary | ICD-10-CM | POA: Diagnosis not present

## 2019-09-02 DIAGNOSIS — Z6821 Body mass index (BMI) 21.0-21.9, adult: Secondary | ICD-10-CM

## 2019-09-02 HISTORY — DX: Unspecified dementia, unspecified severity, without behavioral disturbance, psychotic disturbance, mood disturbance, and anxiety: F03.90

## 2019-09-02 HISTORY — PX: LEFT HEART CATH AND CORONARY ANGIOGRAPHY: CATH118249

## 2019-09-02 HISTORY — PX: CORONARY/GRAFT ACUTE MI REVASCULARIZATION: CATH118305

## 2019-09-02 LAB — BASIC METABOLIC PANEL
Anion gap: 10 (ref 5–15)
BUN: 33 mg/dL — ABNORMAL HIGH (ref 8–23)
CO2: 25 mmol/L (ref 22–32)
Calcium: 8.8 mg/dL — ABNORMAL LOW (ref 8.9–10.3)
Chloride: 110 mmol/L (ref 98–111)
Creatinine, Ser: 0.73 mg/dL (ref 0.44–1.00)
GFR calc Af Amer: >60 mL/min (ref >60)
GFR calc non Af Amer: >60 mL/min (ref >60)
Glucose, Bld: 212 mg/dL — ABNORMAL HIGH (ref 70–99)
Potassium: 3.4 mmol/L — ABNORMAL LOW (ref 3.5–5.1)
Sodium: 145 mmol/L (ref 135–145)

## 2019-09-02 LAB — CBC
HCT: 34.2 % — ABNORMAL LOW (ref 36.0–46.0)
Hemoglobin: 11.3 g/dL — ABNORMAL LOW (ref 12.0–15.0)
MCH: 29.6 pg (ref 26.0–34.0)
MCHC: 33 g/dL (ref 30.0–36.0)
MCV: 89.5 fL (ref 80.0–100.0)
Platelets: 282 10*3/uL (ref 150–400)
RBC: 3.82 MIL/uL — ABNORMAL LOW (ref 3.87–5.11)
RDW: 14.3 % (ref 11.5–15.5)
WBC: 11.5 10*3/uL — ABNORMAL HIGH (ref 4.0–10.5)
nRBC: 0 % (ref 0.0–0.2)

## 2019-09-02 LAB — CK: Total CK: 62 U/L (ref 38–234)

## 2019-09-02 LAB — TROPONIN I (HIGH SENSITIVITY): Troponin I (High Sensitivity): 148 ng/L (ref <18)

## 2019-09-02 IMAGING — CT CT HEAD W/O CM
3 series · 16 of 44 positions shown, 19 images · non-contrast
Comparison: Head CT [DATE]

CLINICAL DATA: [AGE] post unwitnessed fall.

EXAM:
CT HEAD WITHOUT CONTRAST
TECHNIQUE: Contiguous axial images were obtained from the base of the skull
through the vertex without intravenous contrast.

[Series 2: head wo · axial · 0.40mm/px · z∈[+382,+492]mm · 10 of 27 slices shown, 13 images]
[im 3/27  brain]
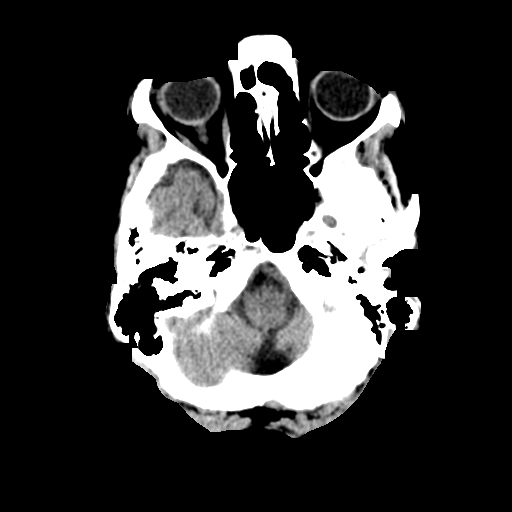
[im 3/27  bone]
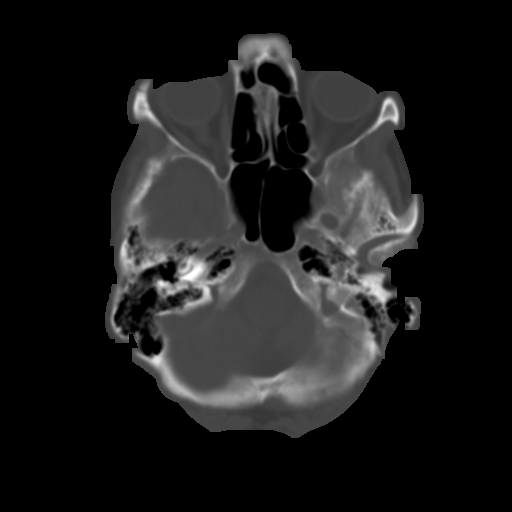
[im 5/27  brain]
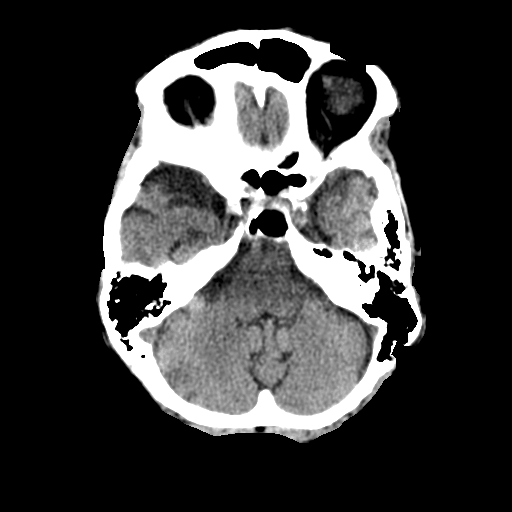
[im 8/27  brain]
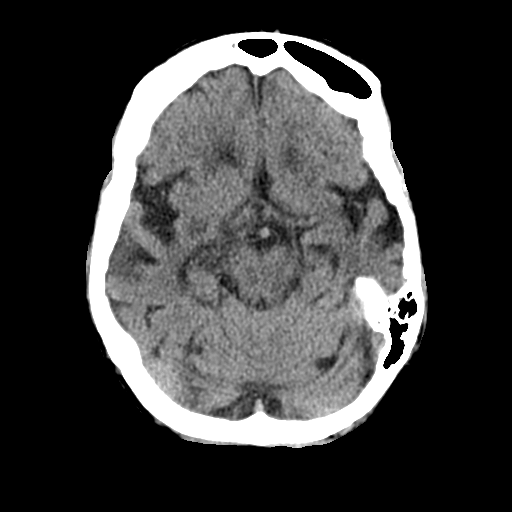
[im 10/27  brain]
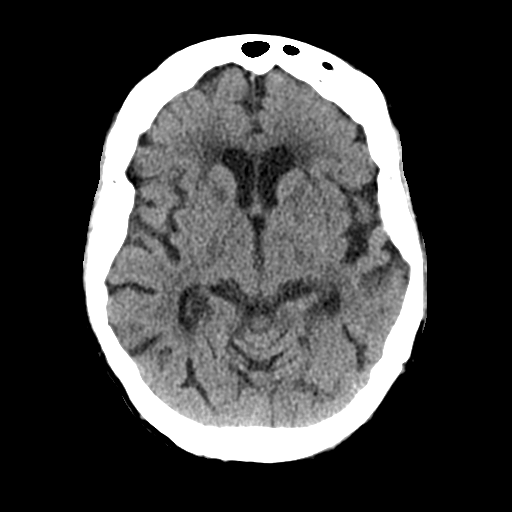
[im 13/27  brain]
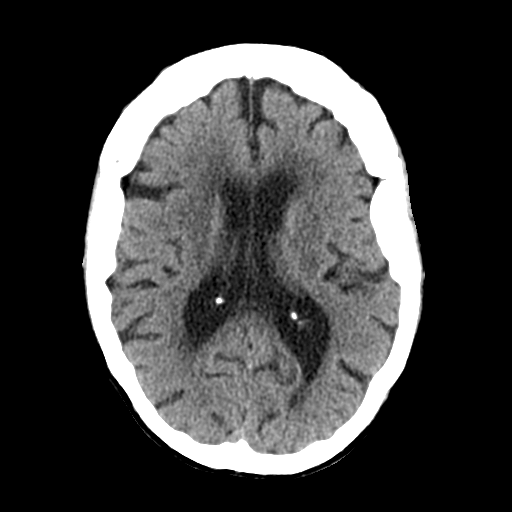
[im 13/27  bone]
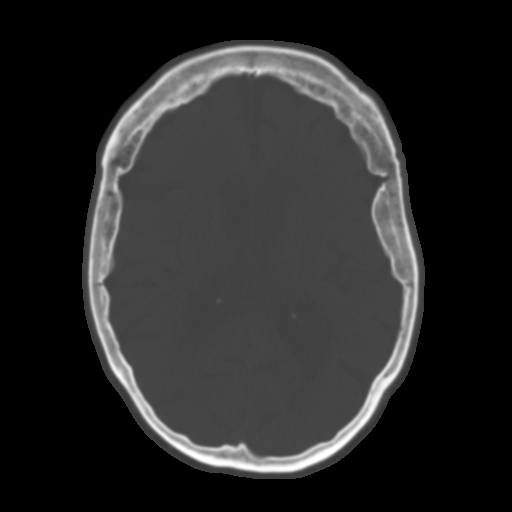
[im 15/27  brain]
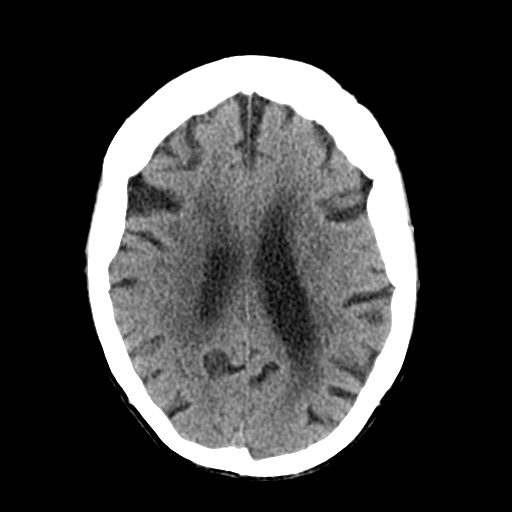
[im 18/27  brain]
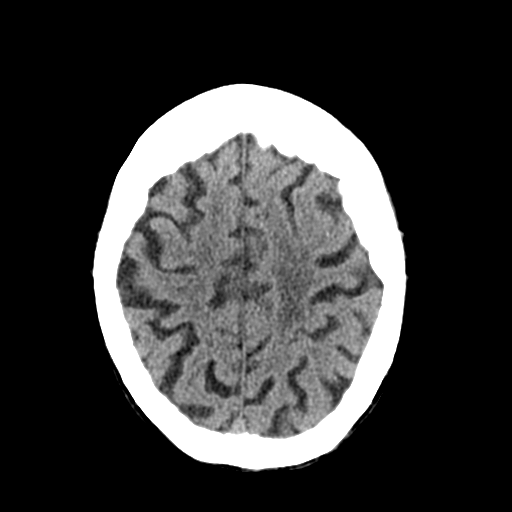
[im 20/27  brain]
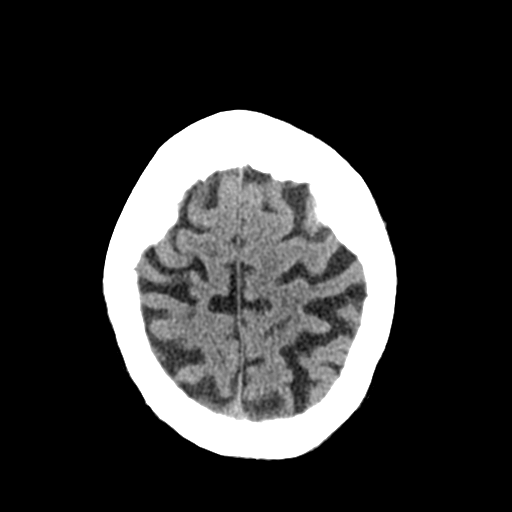
[im 23/27  brain]
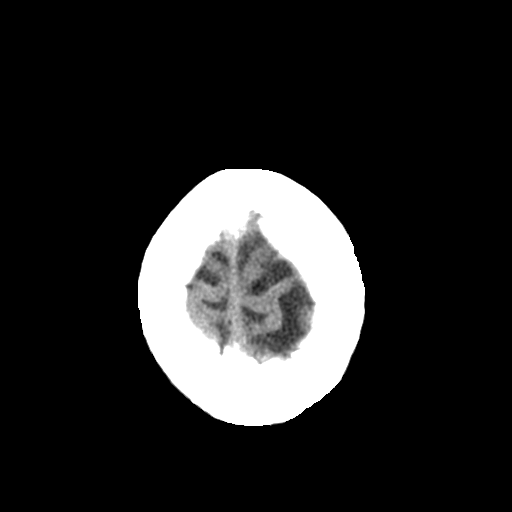
[im 23/27  bone]
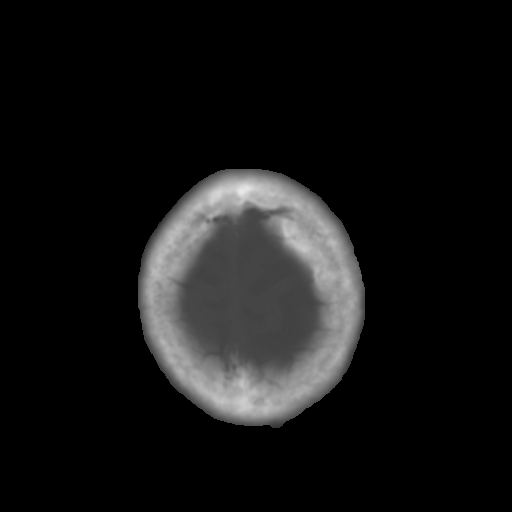
[im 25/27  brain]
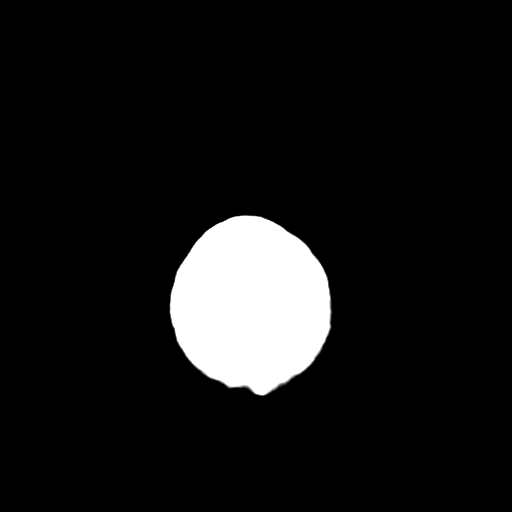

[Series 4: coronal soft tissue · coronal · 0.28mm/px · 3 of 59 slices shown]
[im 20/59  brain]
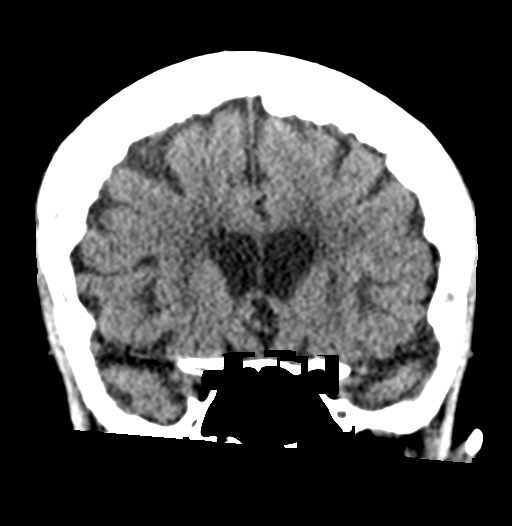
[im 26/59  brain]
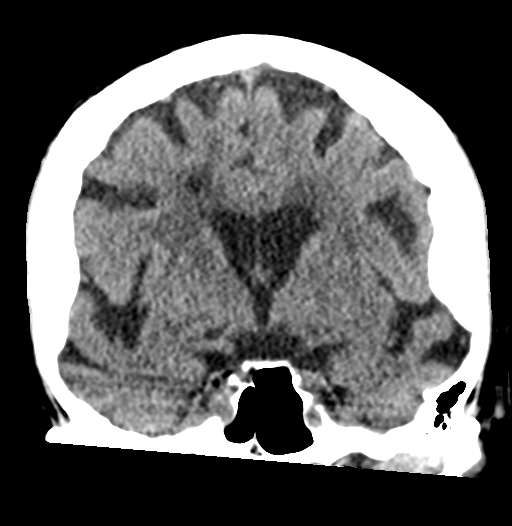
[im 33/59  brain]
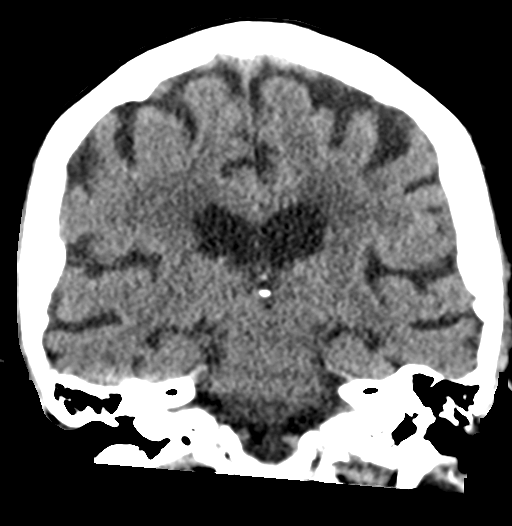

[Series 5: sagittal soft tissue · sagittal · 0.29mm/px · 3 of 44 slices shown]
[im 15/44  brain]
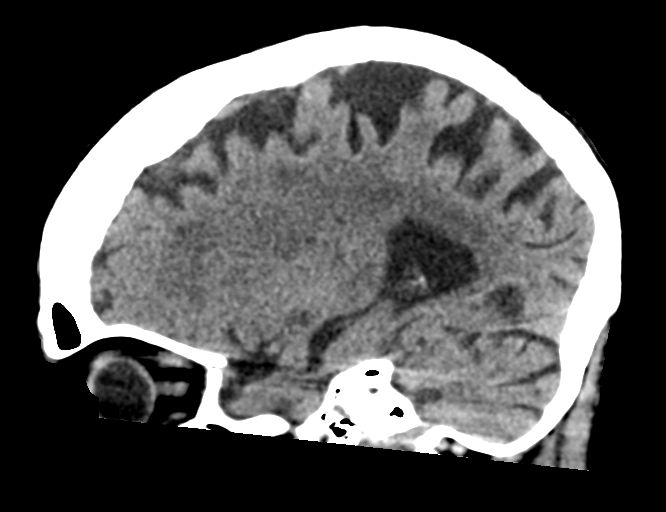
[im 22/44  brain]
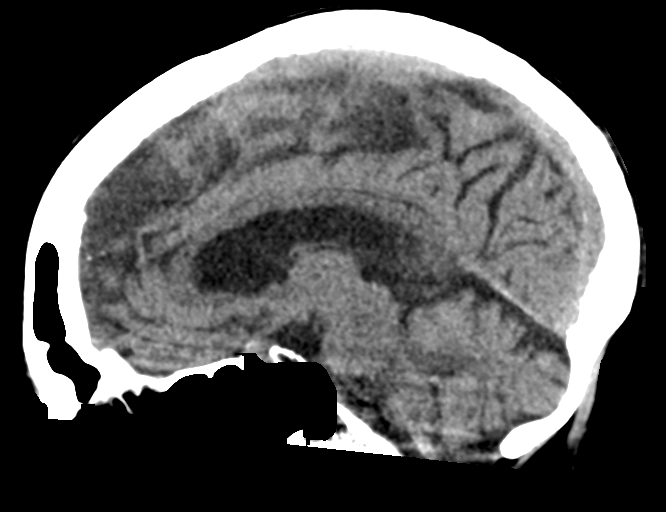
[im 29/44  brain]
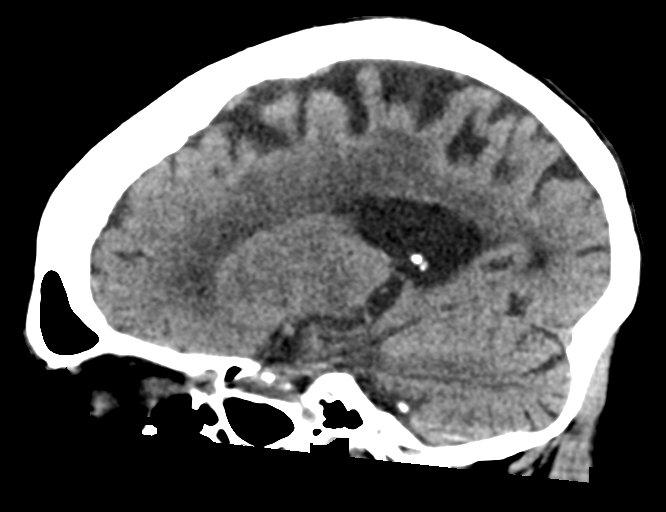

[16 of 44 positions shown; findings below may reference images not displayed]

FINDINGS: Brain: No intracranial hemorrhage, mass effect, or midline shift.
Stable degree of atrophy and chronic small vessel ischemia. No
hydrocephalus. The basilar cisterns are patent. Remote left
cerebellar infarct is unchanged. No evidence of territorial infarct
or acute ischemia. No extra-axial or intracranial fluid collection.

Vascular: Atherosclerosis of skullbase vasculature without
hyperdense vessel or abnormal calcification.

Skull: No fracture or focal lesion. Chronic bifrontal hyperostosis.

Sinuses/Orbits: Paranasal sinuses and mastoid air cells are clear.
The visualized orbits are unremarkable. Bilateral cataract
resection.

Other: None.
IMPRESSION: 1. No acute intracranial abnormality. No skull fracture.
2. Stable atrophy and chronic small vessel ischemia. Remote left
cerebellar infarct.

## 2019-09-02 IMAGING — CT CT CERVICAL SPINE W/O CM
3 of 4 series · 9 of 33 positions shown, 10 images · non-contrast
Comparison: None.

CLINICAL DATA: Post unwitnessed fall.

EXAM:
CT CERVICAL SPINE WITHOUT CONTRAST
TECHNIQUE: Multidetector CT imaging of the cervical spine was performed without
intravenous contrast. Multiplanar CT image reconstructions were also
generated.

[Series 4: sagittal bone · sagittal · 0.27mm/px · 5 of 42 slices shown]
[im 14/42  bone]
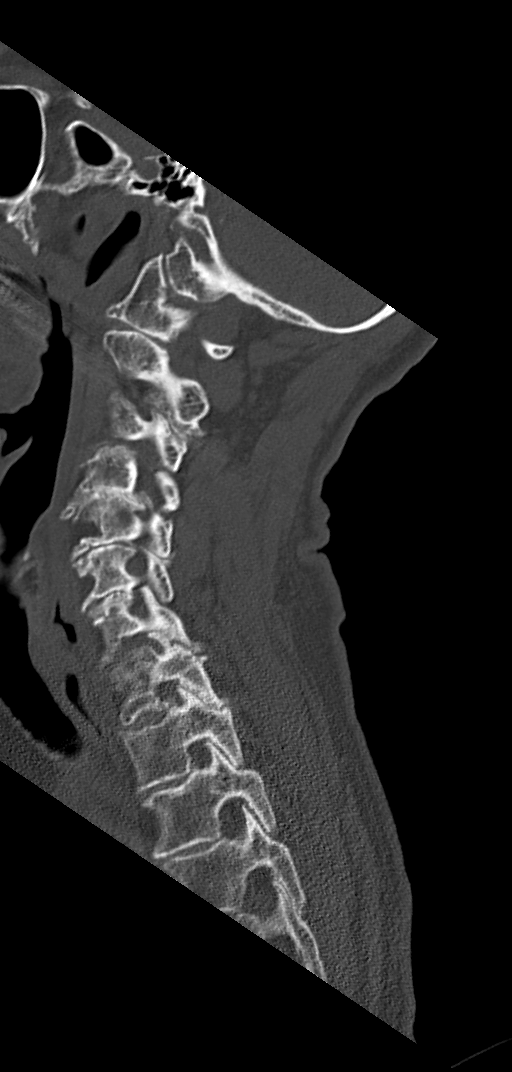
[im 18/42  bone]
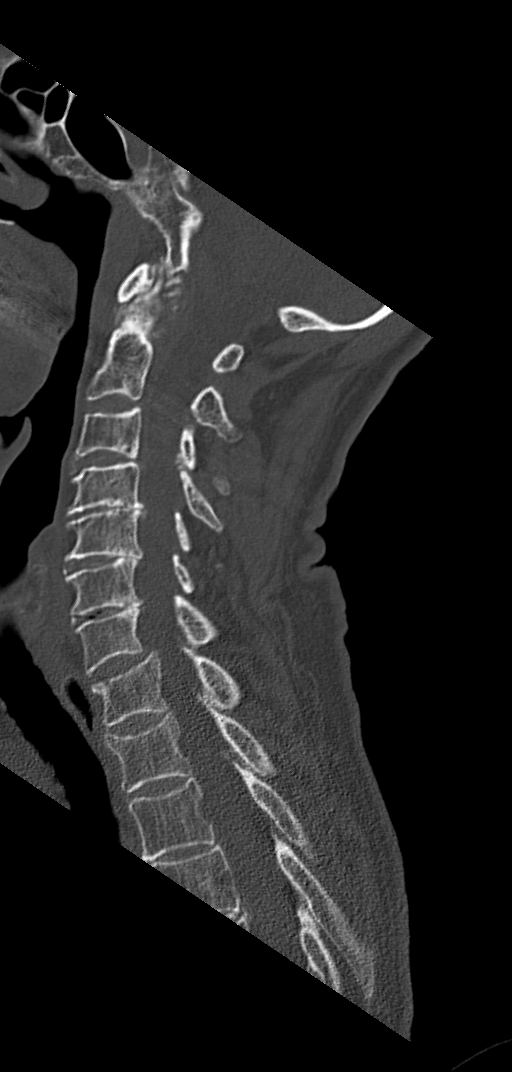
[im 21/42  bone]
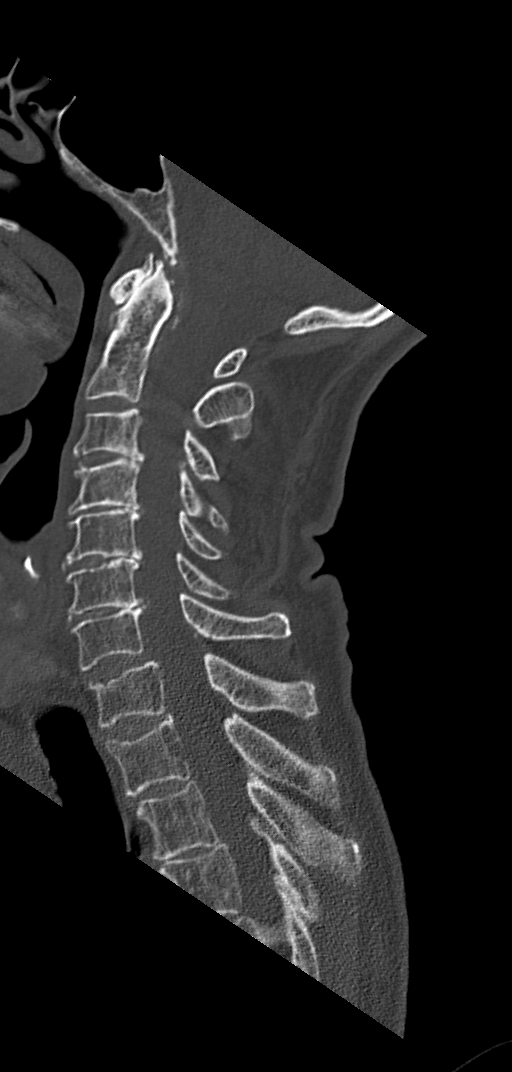
[im 24/42  bone]
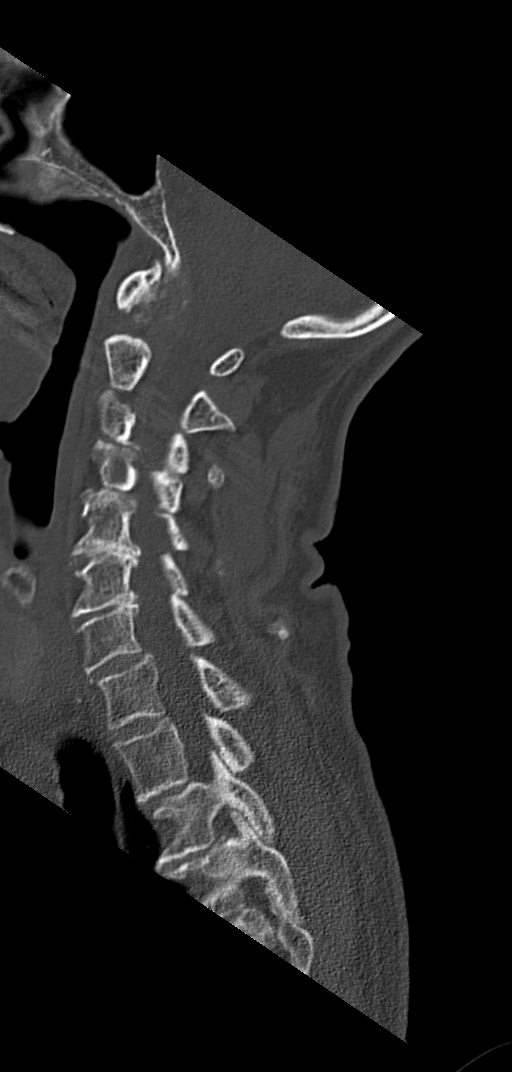
[im 28/42  bone]
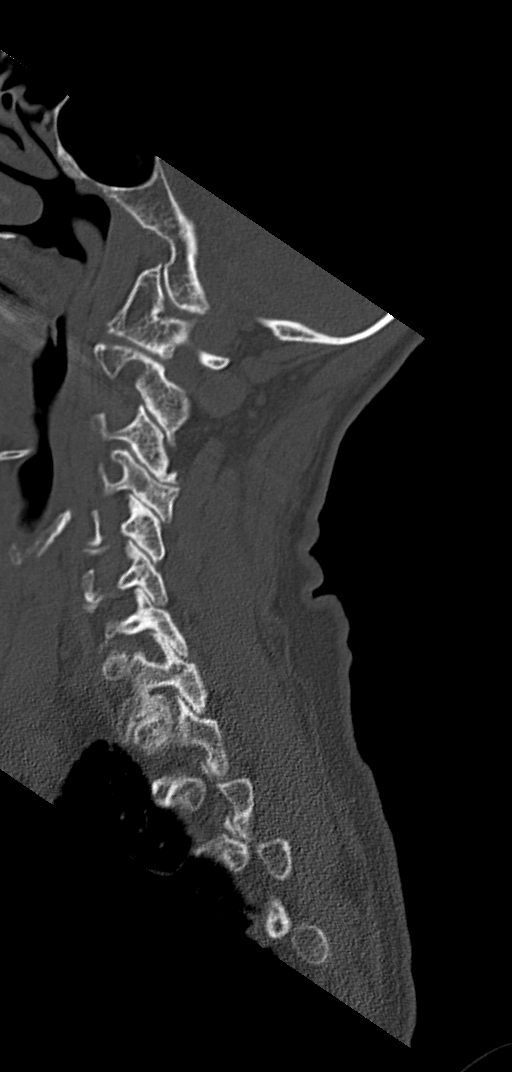

[Series 5: coronal bone · coronal · 0.18mm/px · 3 of 57 slices shown]
[im 12/57  bone]
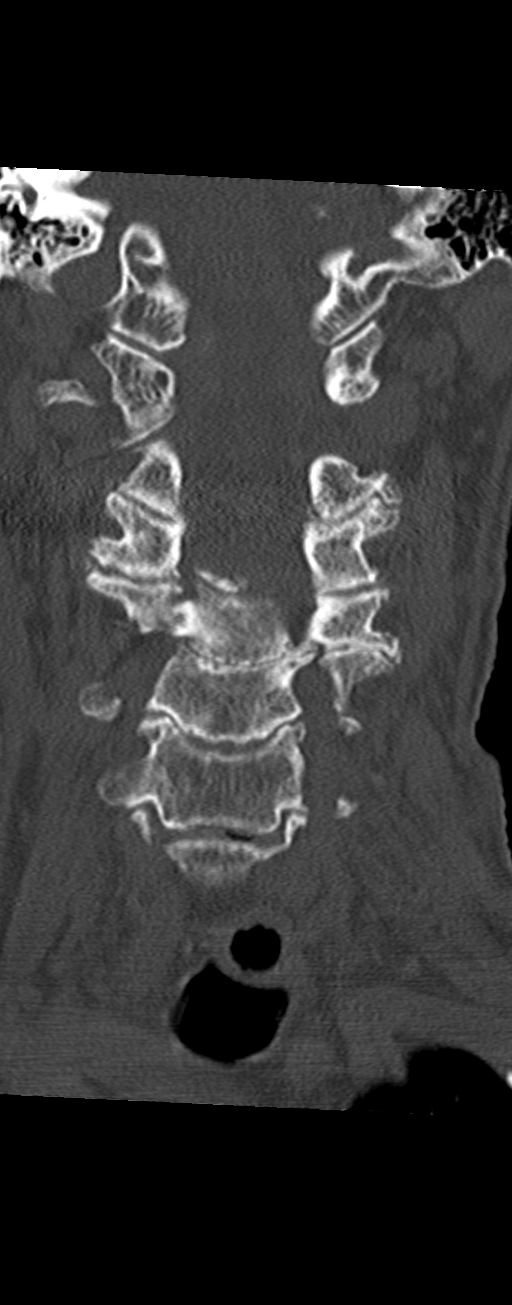
[im 23/57  bone]
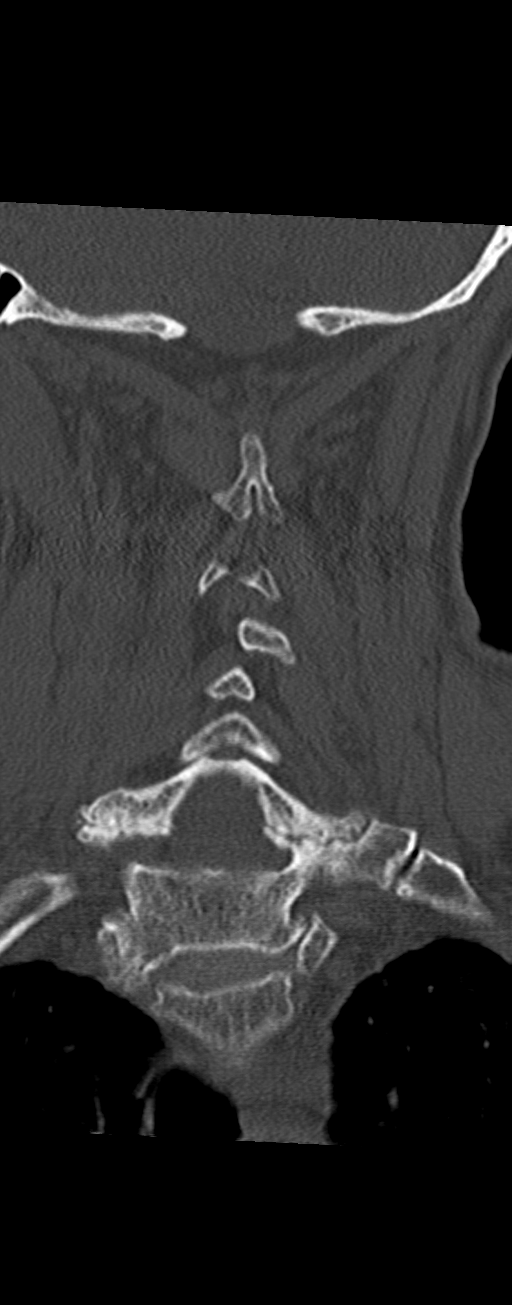
[im 34/57  bone]
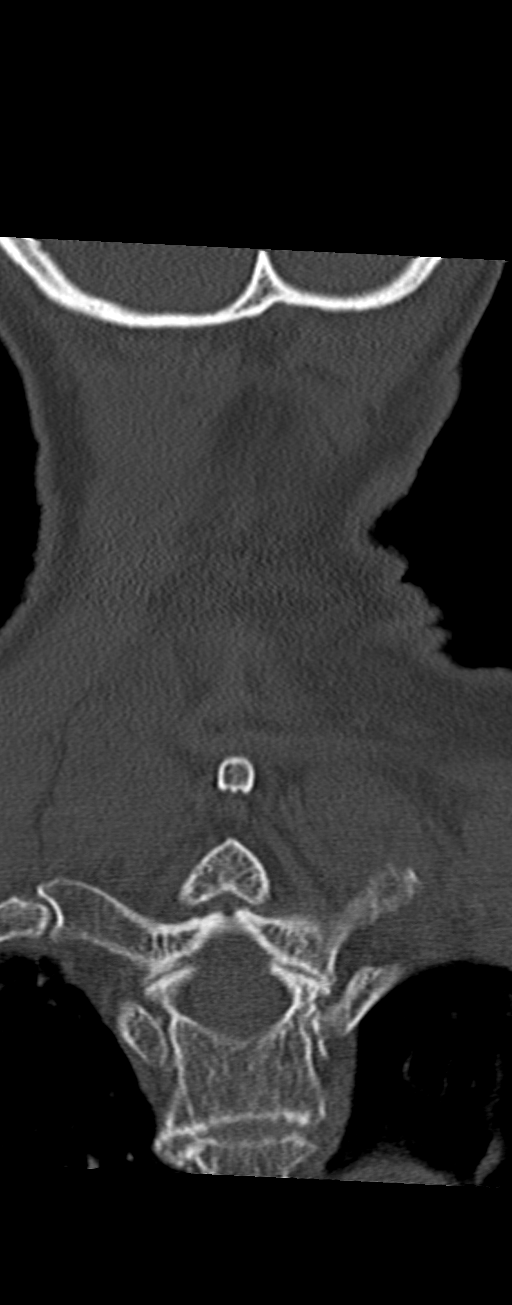

[Series 6: orthogonal bone · axial · 0.21mm/px · z∈[+260,+260]mm · 1 of 90 slices shown, 2 images]
[im 45/90  soft-tissue]
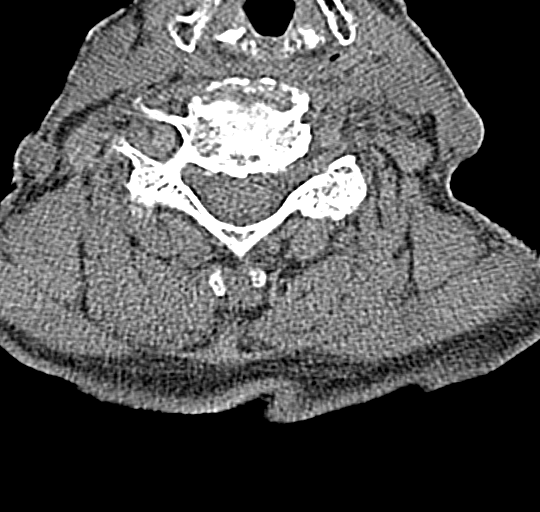
[im 45/90  bone]
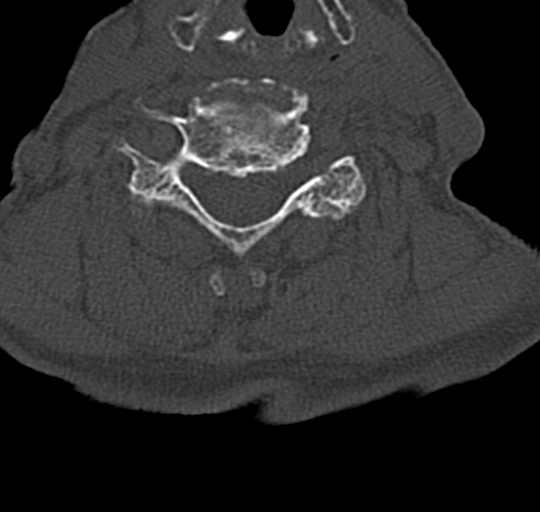

[9 of 33 positions shown; findings below may reference images not displayed]

FINDINGS: Alignment: Trace anterolisthesis of C7 on T1 is likely degenerative.
No traumatic subluxation.

Skull base and vertebrae: No acute fracture. Cervical vertebral body
heights are preserved. Minor superior endplate compression
deformities of T1 and T2. the dens and skull base are intact. No
evidence of focal lesion.

Soft tissues and spinal canal: No prevertebral fluid or swelling. No
visible canal hematoma.

Disc levels: Diffuse multilevel degenerative disc disease with disc
space narrowing and endplate spurring. There is prominent facet
hypertrophy throughout.

Upper chest: Minor septal thickening at the apices and may represent
pulmonary edema.

Other: None.
IMPRESSION: 1. No acute fracture or traumatic subluxation of the cervical spine.
2. Multilevel degenerative disc disease and facet hypertrophy.
3. Minor chronic appearing superior endplate compression deformities
of T1 and T2.
4. Possible pulmonary edema at the lung apices.

## 2019-09-02 SURGERY — CORONARY/GRAFT ACUTE MI REVASCULARIZATION
Anesthesia: Moderate Sedation

## 2019-09-02 MED ORDER — VERAPAMIL HCL 2.5 MG/ML IV SOLN
INTRAVENOUS | Status: DC | PRN
  Administered 2019-09-02: 2.5 mg via INTRAVENOUS

## 2019-09-02 MED ORDER — CANGRELOR TETRASODIUM 50 MG IV SOLR
INTRAVENOUS | Status: AC
  Filled 2019-09-02: qty 50

## 2019-09-02 MED ORDER — SODIUM CHLORIDE 0.9 % IV SOLN
INTRAVENOUS | Status: AC | PRN
  Administered 2019-09-02: 4 ug/kg/min via INTRAVENOUS

## 2019-09-02 MED ORDER — HEPARIN SODIUM (PORCINE) 1000 UNIT/ML IJ SOLN
INTRAMUSCULAR | Status: AC
  Filled 2019-09-02: qty 1

## 2019-09-02 MED ORDER — VERAPAMIL HCL 2.5 MG/ML IV SOLN
INTRAVENOUS | Status: AC
  Filled 2019-09-02: qty 2

## 2019-09-02 MED ORDER — NITROGLYCERIN 1 MG/10 ML FOR IR/CATH LAB
INTRA_ARTERIAL | Status: AC
  Filled 2019-09-02: qty 10

## 2019-09-02 MED ORDER — ASPIRIN 81 MG PO CHEW
324.0000 mg | CHEWABLE_TABLET | Freq: Once | ORAL | Status: AC
  Administered 2019-09-02: 324 mg via ORAL

## 2019-09-02 MED ORDER — HEPARIN SODIUM (PORCINE) 1000 UNIT/ML IJ SOLN
INTRAMUSCULAR | Status: DC | PRN
  Administered 2019-09-02: 2500 [IU] via INTRAVENOUS
  Administered 2019-09-02: 2000 [IU] via INTRAVENOUS
  Administered 2019-09-02: 2500 [IU] via INTRAVENOUS
  Administered 2019-09-03: 2000 [IU] via INTRAVENOUS

## 2019-09-02 MED ORDER — HEPARIN (PORCINE) IN NACL 1000-0.9 UT/500ML-% IV SOLN
INTRAVENOUS | Status: AC
  Filled 2019-09-02: qty 1000

## 2019-09-02 MED ORDER — NITROGLYCERIN 1 MG/10 ML FOR IR/CATH LAB
INTRA_ARTERIAL | Status: DC | PRN
  Administered 2019-09-02 – 2019-09-03 (×2): 200 ug via INTRACORONARY

## 2019-09-02 MED ORDER — CANGRELOR BOLUS VIA INFUSION
INTRAVENOUS | Status: DC | PRN
  Administered 2019-09-02: 1470 ug via INTRAVENOUS

## 2019-09-02 SURGICAL SUPPLY — 21 items
BALLN TREK RX 3.0X12 (BALLOONS) ×2
BALLN ~~LOC~~ EUPHORA RX 4.5X20 (BALLOONS) ×2
BALLN ~~LOC~~ TREK RX 4.5X12 (BALLOONS) ×2
BALLOON TREK RX 3.0X12 (BALLOONS) ×1 IMPLANT
BALLOON ~~LOC~~ EUPHORA RX 4.5X20 (BALLOONS) ×1 IMPLANT
BALLOON ~~LOC~~ TREK RX 4.5X12 (BALLOONS) ×1 IMPLANT
CATH INFINITI 5 FR JL3.5 (CATHETERS) ×2 IMPLANT
CATH INFINITI JR4 5F (CATHETERS) ×2 IMPLANT
CATH VISTA GUIDE 6FR JR4 (CATHETERS) ×2 IMPLANT
DEVICE INFLAT 30 PLUS (MISCELLANEOUS) ×2 IMPLANT
DEVICE RAD TR BAND REGULAR (VASCULAR PRODUCTS) ×2 IMPLANT
GLIDESHEATH SLEND SS 6F .021 (SHEATH) ×2 IMPLANT
GUIDEWIRE INQWIRE 1.5J.035X260 (WIRE) ×1 IMPLANT
INQWIRE 1.5J .035X260CM (WIRE) ×2
KIT MANI 3VAL PERCEP (MISCELLANEOUS) ×2 IMPLANT
PACK CARDIAC CATH (CUSTOM PROCEDURE TRAY) ×2 IMPLANT
STENT SYNERGY DES 4X12 (Permanent Stent) ×2 IMPLANT
STENT SYNERGY DES 4X32 (Permanent Stent) ×2 IMPLANT
STENT SYNERGY DES 4X38 (Permanent Stent) ×2 IMPLANT
WIRE ASAHI GRAND SLAM 180CM (WIRE) ×2 IMPLANT
WIRE RUNTHROUGH .014X180CM (WIRE) ×2 IMPLANT

## 2019-09-02 NOTE — ED Provider Notes (Signed)
[facility] Emergency Department Provider Note  ____________________________________________   I have reviewed the triage vital signs and the nursing notes.   HISTORY  Chief Complaint Fall and Altered Mental Status   History limited by and level 5 caveat due to: Dementia   HPI Elizabeth [name] is a 86 y.o. female who presents to the emergency department today after being found down outside. Apparently she was seen earlier in the day sitting outside. Patient does have dementia so cannot give a great history. She denies any pain. Into speaking to the power of attorney the patient apparently does live independently although has a caretaker who is there during the day. She is not on any blood thinning medications.   Records reviewed. Per medical record review patient has a history of dementia.  History reviewed. No pertinent past medical history.  There are no problems to display for this patient.   History reviewed. No pertinent surgical history.  Prior to Admission medications   Not on File    Allergies Patient has no known allergies.  History reviewed. No pertinent family history.  Social History Social History   Tobacco Use  . Smoking status: Never Smoker  . Smokeless tobacco: Never Used  Substance Use Topics  . Alcohol use: Never  . Drug use: Not on file    Review of Systems Constitutional: No fever/chills Eyes: No visual changes. ENT: No sore throat. Cardiovascular: Denies chest pain. Respiratory: Denies shortness of breath. Gastrointestinal: No abdominal pain.  No nausea, no vomiting.  No diarrhea.   Genitourinary: Negative for dysuria. Musculoskeletal: Negative for back pain. Skin: Negative for rash. Neurological: Negative for headaches, focal weakness or numbness.  ____________________________________________   PHYSICAL EXAM:  VITAL SIGNS: ED Triage Vitals  Enc Vitals Group     BP --      Pulse Rate 09/02/19 2041  (!) 101     Resp 09/02/19 2041 16     Temp 09/02/19 2041 98.7 F (37.1 C)     Temp Source 09/02/19 2041 Oral     SpO2 09/02/19 2041 96 %     Weight 09/02/19 2043 108 lb 0.4 oz (49 kg)     Height 09/02/19 2043 5\' 2"  (1.575 m)     Head Circumference --      Peak Flow --      Pain Score 09/02/19 2043 0   Constitutional: Alert and oriented.  Eyes: Conjunctivae are normal.  ENT      Head: Normocephalic and atraumatic.      Nose: No congestion/rhinnorhea.      Mouth/Throat: Mucous membranes are moist.      Neck: No stridor. No midline tenderness.  Hematological/Lymphatic/Immunilogical: No cervical lymphadenopathy. Cardiovascular: Normal rate, regular rhythm.  No murmurs, rubs, or gallops.  Respiratory: Normal respiratory effort without tachypnea nor retractions. Breath sounds are clear and equal bilaterally. No wheezes/rales/rhonchi. Gastrointestinal: Soft and non tender. No rebound. No guarding.  Genitourinary: Deferred Musculoskeletal: Normal range of motion in all extremities. No lower extremity edema. No tenderness to palpation of the spine. No tenderness to palpation of the hips or extremities.  Neurologic:  Normal speech and language. No gross focal neurologic deficits are appreciated.  Skin:  Skin is warm, dry and intact. No rash noted. Psychiatric: Mood and affect are normal. Speech and behavior are normal. Patient exhibits appropriate insight and judgment.  ____________________________________________    LABS (pertinent positives/negatives)  CBC wbc 11.5, hgb 11.3, plt 282 BMP na 145, k 3.4, glu 212, cr  0.73 Trop hs 148 CK 62 ____________________________________________   EKG  IPhineas Semen, attending physician, personally viewed and interpreted this EKG  EKG Time: 2044 Rate: 100 Rhythm: sinus tachycardia Axis: normal Intervals: qtc 467 QRS: narrow ST changes: no st elevation Impression: abnormal ekg  I, Phineas Semen, attending physician, personally  viewed and interpreted this EKG  EKG Time: 2202 Rate: 80 Rhythm: normal Axis: ?right axis, possible limb lead reversal Intervals: qtc 447 QRS: narrow ST changes: st elevation II III aVF Impression: STEMI   I, Phineas Semen, attending physician, personally viewed and interpreted this EKG  EKG Time: 2233 Rate: 75 Rhythm: sinus rhythm Axis: normal Intervals: qtc 474 QRS: narrow ST changes: st elevation II, III, aVF, V4-V6 Impression: STEMI   ____________________________________________    RADIOLOGY  CT head/cervical spine No acute traumatic findings ____________________________________________   PROCEDURES  Procedures  CRITICAL CARE Performed by: Phineas Semen   Total critical care time: 30 minutes  Critical care time was exclusive of separately billable procedures and treating other patients.  Critical care was necessary to treat or prevent imminent or life-threatening deterioration.  Critical care was time spent personally by me on the following activities: development of treatment plan with patient and/or surrogate as well as nursing, discussions with consultants, evaluation of patient's response to treatment, examination of patient, obtaining history from patient or surrogate, ordering and performing treatments and interventions, ordering and review of laboratory studies, ordering and review of radiographic studies, pulse oximetry and re-evaluation of patient's condition.  ____________________________________________   INITIAL IMPRESSION / ASSESSMENT AND PLAN / ED COURSE  Pertinent labs & imaging results that were available during my care of the patient were reviewed by me and considered in my medical decision making (see chart for details).   Patient presented to the emergency department today because of concerns for a fall. Patient does have history of dementia and cannot give any significant history. Not initial exam without any concerning traumatic  injuries. However a CT head and cervical spine were obtained given fall and these were negative. Blood work was performed and did come back with elevated troponin. Repeat EKG was concerning for new ST elevation in the inferior leads. Because of this STEMI was called. Patient was given aspirin and heparin. Dr. Okey Dupre with cardiology evaluated the patient and took her emergently to the catheterization lab.   ____________________________________________   FINAL CLINICAL IMPRESSION(S) / ED DIAGNOSES  Final diagnoses:  ST elevation myocardial infarction (STEMI), unspecified artery (HCC)  Fall, initial encounter     Note: This dictation was prepared with Nurse, children's dictation. Any transcriptional errors that result from this process are unintentional     Phineas Semen, MD 09/02/19 2337

## 2019-09-02 NOTE — ED Notes (Signed)
Cardiologist at bedside.  

## 2019-09-02 NOTE — ED Notes (Signed)
Repeat EKG seen by cardiologist.

## 2019-09-02 NOTE — ED Notes (Signed)
Cath lab notified this RN that they are ready for the patient upstairs.

## 2019-09-02 NOTE — ED Notes (Signed)
Dr. Goodman at bedside.  

## 2019-09-02 NOTE — ED Notes (Signed)
Raquel, RN at bedside with this RN per code stemi. Raquel, RN obtained second IV access, defib pads on patient at this time. Pt placed on 2 L of O2.

## 2019-09-02 NOTE — ED Notes (Signed)
Pt transported to CT ?

## 2019-09-02 NOTE — ED Notes (Signed)
Pt denies cp, shob, n/v, and other symptoms at this time.

## 2019-09-02 NOTE — ED Notes (Signed)
Elizabeth [name], POA discussing decision with cardiologist on behalf of the patient due to extreme dementia in history.

## 2019-09-02 NOTE — ED Notes (Signed)
Covid swab walked to the lab by Raquel, RN.

## 2019-09-02 NOTE — ED Triage Notes (Signed)
Pt arrival via [facility] from home due to fall. Pt was seen laying outside in the driveway by a neighbor who was driving by. Pt was seen earlier around 6:30 pm sitting out in the driveway, but was fine at that time. Pt denies pain and doesn't remember what happened. Pt is unsure if she hit her head.   Pt vitals with EMS: -HR 102 -O2 97% [place] 2 L -BP 120/56 -ETCO2- 37  700 cc's of fluid given in route.

## 2019-09-02 NOTE — ED Notes (Signed)
Code STEMI called by Dr. Derrill Kay.

## 2019-09-02 NOTE — Consult Note (Signed)
Cardiology Consultation:   Patient ID: Elizabeth [name] MRN: [id number]; DOB: December 19, 1927  Admit date: 09/02/2019 Date of Consult: 09/02/2019  Primary Care Provider: Tracie Harrier, MD Primary Cardiologist: New - Jionni Helming Primary Electrophysiologist:  None    Patient Profile:   Elizabeth [name] is a 86 y.o. female with history of dementia, who is being seen today for the evaluation of syncope and abnormal EKG at the request of Dr. Archie Balboa.  History of Present Illness:   Elizabeth [name] has dementia and is unable to provide most of the history.  History is gathered from her chart and her sister-in-law, who accompanies her in the emergency department and is also her healthcare power of attorney.  Elizabeth [name] was reportedly seated in her driveway this evening around 7:00.  She was found shortly thereafter by a neighbor, lying on the ground.  It is uncertain how long she may have been down for or how this fall occurred.  Her caretaker, who spends the day with her as well as her sister-in-law, had noticed some right-sided weakness over the last week.  However, the patient denied any symptoms.  She was transported to the ER by EMS, where initial EKG showed sinus rhythm with nonspecific ST changes.  CT of the head and cervical spine showed no acute abnormalities.  Labs returned with mildly elevated high-sensitivity troponin, prompting repeat EKG that now showed inferior ST elevation as well as posterior changes.  Elizabeth [name] remains asymptomatic, denying chest pain, shortness of breath, and nausea.   Past Medical History:  Diagnosis Date  . Dementia ([place])     History reviewed. No pertinent surgical history.   Home Medications:  Prior to Admission medications   Not on File    Inpatient Medications: Scheduled Meds:  Continuous Infusions:  PRN Meds:   Allergies:   No Known Allergies  Social History:   Social History   Tobacco Use  . Smoking status: Never Smoker  .  Smokeless tobacco: Never Used  Substance Use Topics  . Alcohol use: Never  . Drug use: Not on file     Family History:   History reviewed. No pertinent family history.   Review of Systems: Review of Systems  Unable to perform ROS: Acuity of condition    BP: 124/65 (!) 133/95 126/74 126/65  Pulse: 94 83 79 74  Resp:   14   Temp:      TempSrc:      SpO2: 95% 95% 100% 98%  Weight:      Height:       No intake or output data in the 24 hours ending 09/02/19 2257 Last 3 Weights 09/02/2019 06/26/2017  Weight (lbs) 108 lb 0.4 oz 110 lb  Weight (kg) 49 kg 49.896 kg     Body mass index is 19.76 kg/m.  General: Frail, elderly woman, lying comfortable on stretcher in the emergency department. HEENT: normal Lymph: no adenopathy Neck: no JVD Endocrine:  No thryomegaly Vascular: No carotid bruits; FA pulses 2+ bilaterally without bruits  Cardiac: Regular rate and rhythm without murmurs, rubs, or gallops. Lungs: Coarse breath sounds without wheezes or crackles. Abd: soft, nontender, no hepatomegaly  Ext: no edema Musculoskeletal: 2+ radial pulses bilaterally.  1+ left and trace right pretibial edema. Neuro: Cranial nerves II through XII grossly intact.  There is 5 out of 5 left upper and lower extremity strength.  There is 4/5 right arm and hand grip strength.  There is 5 out of 5 right leg strength. Psych:  Flat affect.  EKG:  The EKG at 10:33 PM was personally reviewed and demonstrates: Normal sinus rhythm with inferolateral ST elevation and reciprocal changes in aVL as well as V1 and V2. Telemetry:  Telemetry was personally reviewed and demonstrates: Sinus rhythm with PVCs  Relevant CV Studies: None.  Laboratory Data:  High Sensitivity Troponin:   Recent Labs  Lab 09/02/19 2049  TROPONINIHS 148*     Chemistry Recent Labs  Lab 09/02/19 2049  NA 145  K 3.4*  CL 110  CO2 25  GLUCOSE 212*  BUN 33*  CREATININE 0.73  CALCIUM 8.8*  GFRNONAA >60  GFRAA >60  ANIONGAP 10     No results for input(s): PROT, ALBUMIN, AST, ALT, ALKPHOS, BILITOT in the last 168 hours. Hematology Recent Labs  Lab 09/02/19 2049  WBC 11.5*  RBC 3.82*  HGB 11.3*  HCT 34.2*  MCV 89.5  MCH 29.6  MCHC 33.0  RDW 14.3  PLT 282   BNPNo results for input(s): BNP, PROBNP in the last 168 hours.  DDimer No results for input(s): DDIMER in the last 168 hours.   Radiology/Studies:  CT Head Wo Contrast  Result Date: 09/02/2019 CLINICAL DATA:  86 year old post unwitnessed fall. EXAM: CT HEAD WITHOUT CONTRAST TECHNIQUE: Contiguous axial images were obtained from the base of the skull through the vertex without intravenous contrast. COMPARISON:  Head CT 06/26/2017 FINDINGS: Brain: No intracranial hemorrhage, mass effect, or midline shift. Stable degree of atrophy and chronic small vessel ischemia. No hydrocephalus. The basilar cisterns are patent. Remote left cerebellar infarct is unchanged. No evidence of territorial infarct or acute ischemia. No extra-axial or intracranial fluid collection. Vascular: Atherosclerosis of skullbase vasculature without hyperdense vessel or abnormal calcification. Skull: No fracture or focal lesion. Chronic bifrontal hyperostosis. Sinuses/Orbits: Paranasal sinuses and mastoid air cells are clear. The visualized orbits are unremarkable. Bilateral cataract resection. Other: None. IMPRESSION: 1. No acute intracranial abnormality. No skull fracture. 2. Stable atrophy and chronic small vessel ischemia. Remote left cerebellar infarct. Electronically Signed   By: Narda Rutherford M.D.   On: 09/02/2019 21:33   CT Cervical Spine Wo Contrast  Result Date: 09/02/2019 CLINICAL DATA:  Post unwitnessed fall. EXAM: CT CERVICAL SPINE WITHOUT CONTRAST TECHNIQUE: Multidetector CT imaging of the cervical spine was performed without intravenous contrast. Multiplanar CT image reconstructions were also generated. COMPARISON:  None. FINDINGS: Alignment: Trace anterolisthesis of C7 on T1 is  likely degenerative. No traumatic subluxation. Skull base and vertebrae: No acute fracture. Cervical vertebral body heights are preserved. Minor superior endplate compression deformities of T1 and T2. the dens and skull base are intact. No evidence of focal lesion. Soft tissues and spinal canal: No prevertebral fluid or swelling. No visible canal hematoma. Disc levels: Diffuse multilevel degenerative disc disease with disc space narrowing and endplate spurring. There is prominent facet hypertrophy throughout. Upper chest: Minor septal thickening at the apices and may represent pulmonary edema. Other: None. IMPRESSION: 1. No acute fracture or traumatic subluxation of the cervical spine. 2. Multilevel degenerative disc disease and facet hypertrophy. 3. Minor chronic appearing superior endplate compression deformities of T1 and T2. 4. Possible pulmonary edema at the lung apices. Electronically Signed   By: Narda Rutherford M.D.   On: 09/02/2019 21:37    TIMI Risk Score for ST  Elevation MI:   The patient's TIMI risk score is 4, which indicates a 7.3% risk of all cause mortality at 30 days.    Assessment and Plan:   Inferolateral STEMI: Patient has not  had any symptoms, though syncope could be presenting sign of acute coronary syndrome.  Initial EKG when the patient first arrived today showed nonspecific ST changes.  However, serial EKGs show developing inferolateral ST segment elevation.  Initial high-sensitivity troponin I was also mildly elevated.  Other than dementia, the patient has minimal significant past medical history.  However, her caregiver and sister-in-law are concerned about some right-sided weakness that began over the last week or 2 that could reflect stroke.  Head CT today shows no acute intracranial abnormality.  Chronic small vessel ischemia and remote left cerebellar infarct are noted.  I spoke at length with the patient and her sister-in-law, who is her healthcare power of attorney, about  the risks and benefits of emergent cardiac catheterization and PCI.  We have agreed to proceed, with Ms. Mccarter sister-in-law providing informed consent.  Right-sided weakness: Present for at least a week and concerning for possible subacute infarct.  Head CT in the ER today shows no acute intracranial abnormality.  Consider MRI following catheterization for further evaluation.  Disposition: Admit to ICU following catheterization.  For questions or updates, please contact [facility] Please consult [email or web address] for contact info under [facility].  Signed, Yvonne Kendall, MD  09/02/2019 10:57 PM

## 2019-09-02 NOTE — ED Notes (Signed)
Floreen Comber, POA signed consent form at this time.

## 2019-09-02 NOTE — ED Notes (Addendum)
4000 units of heparin given by this RN per verbal order by Dr. Derrill Kay.

## 2019-09-02 NOTE — ED Notes (Signed)
Pt transported to cath lab with this RN and Garment/textile technologist.

## 2019-09-03 ENCOUNTER — Encounter: Payer: Self-pay | Admitting: Cardiology

## 2019-09-03 ENCOUNTER — Inpatient Hospital Stay (HOSPITAL_COMMUNITY)
Admit: 2019-09-03 | Discharge: 2019-09-03 | Disposition: A | Discharge status: Home / Self Care | Payer: Medicare HMO | Attending: Internal Medicine | Admitting: Internal Medicine

## 2019-09-03 ENCOUNTER — Inpatient Hospital Stay: Payer: Medicare HMO

## 2019-09-03 DIAGNOSIS — W1830XA Fall on same level, unspecified, initial encounter: Secondary | ICD-10-CM | POA: Diagnosis present

## 2019-09-03 DIAGNOSIS — I2111 ST elevation (STEMI) myocardial infarction involving right coronary artery: Secondary | ICD-10-CM | POA: Diagnosis present

## 2019-09-03 DIAGNOSIS — R54 Age-related physical debility: Secondary | ICD-10-CM | POA: Diagnosis not present

## 2019-09-03 DIAGNOSIS — I69351 Hemiplegia and hemiparesis following cerebral infarction affecting right dominant side: Secondary | ICD-10-CM | POA: Diagnosis not present

## 2019-09-03 DIAGNOSIS — I10 Essential (primary) hypertension: Secondary | ICD-10-CM | POA: Diagnosis present

## 2019-09-03 DIAGNOSIS — E876 Hypokalemia: Secondary | ICD-10-CM | POA: Diagnosis present

## 2019-09-03 DIAGNOSIS — R197 Diarrhea, unspecified: Secondary | ICD-10-CM | POA: Diagnosis not present

## 2019-09-03 DIAGNOSIS — R410 Disorientation, unspecified: Secondary | ICD-10-CM | POA: Diagnosis not present

## 2019-09-03 DIAGNOSIS — Z6821 Body mass index (BMI) 21.0-21.9, adult: Secondary | ICD-10-CM | POA: Diagnosis not present

## 2019-09-03 DIAGNOSIS — F0280 Dementia in other diseases classified elsewhere without behavioral disturbance: Secondary | ICD-10-CM

## 2019-09-03 DIAGNOSIS — R1310 Dysphagia, unspecified: Secondary | ICD-10-CM | POA: Diagnosis present

## 2019-09-03 DIAGNOSIS — I2510 Atherosclerotic heart disease of native coronary artery without angina pectoris: Secondary | ICD-10-CM | POA: Diagnosis present

## 2019-09-03 DIAGNOSIS — S5012XA Contusion of left forearm, initial encounter: Secondary | ICD-10-CM | POA: Diagnosis present

## 2019-09-03 DIAGNOSIS — F0151 Vascular dementia with behavioral disturbance: Secondary | ICD-10-CM | POA: Diagnosis not present

## 2019-09-03 DIAGNOSIS — Y92014 Private driveway to single-family (private) house as the place of occurrence of the external cause: Secondary | ICD-10-CM | POA: Diagnosis not present

## 2019-09-03 DIAGNOSIS — E43 Unspecified severe protein-calorie malnutrition: Secondary | ICD-10-CM | POA: Diagnosis present

## 2019-09-03 DIAGNOSIS — I213 ST elevation (STEMI) myocardial infarction of unspecified site: Secondary | ICD-10-CM | POA: Diagnosis present

## 2019-09-03 DIAGNOSIS — E785 Hyperlipidemia, unspecified: Secondary | ICD-10-CM | POA: Diagnosis present

## 2019-09-03 DIAGNOSIS — F05 Delirium due to known physiological condition: Secondary | ICD-10-CM | POA: Diagnosis not present

## 2019-09-03 DIAGNOSIS — I959 Hypotension, unspecified: Secondary | ICD-10-CM | POA: Diagnosis not present

## 2019-09-03 DIAGNOSIS — R55 Syncope and collapse: Secondary | ICD-10-CM | POA: Diagnosis not present

## 2019-09-03 DIAGNOSIS — W19XXXA Unspecified fall, initial encounter: Secondary | ICD-10-CM | POA: Diagnosis not present

## 2019-09-03 DIAGNOSIS — I6782 Cerebral ischemia: Secondary | ICD-10-CM | POA: Diagnosis present

## 2019-09-03 DIAGNOSIS — Z20822 Contact with and (suspected) exposure to covid-19: Secondary | ICD-10-CM | POA: Diagnosis present

## 2019-09-03 DIAGNOSIS — D72829 Elevated white blood cell count, unspecified: Secondary | ICD-10-CM | POA: Diagnosis present

## 2019-09-03 DIAGNOSIS — D649 Anemia, unspecified: Secondary | ICD-10-CM | POA: Diagnosis present

## 2019-09-03 DIAGNOSIS — F0390 Unspecified dementia without behavioral disturbance: Secondary | ICD-10-CM | POA: Diagnosis present

## 2019-09-03 LAB — POCT ACTIVATED CLOTTING TIME
Activated Clotting Time: 208 seconds
Activated Clotting Time: 252 seconds
Activated Clotting Time: 274 seconds
Activated Clotting Time: 290 seconds

## 2019-09-03 LAB — BASIC METABOLIC PANEL
Anion gap: 7 (ref 5–15)
BUN: 26 mg/dL — ABNORMAL HIGH (ref 8–23)
CO2: 26 mmol/L (ref 22–32)
Calcium: 8.4 mg/dL — ABNORMAL LOW (ref 8.9–10.3)
Chloride: 109 mmol/L (ref 98–111)
Creatinine, Ser: 0.63 mg/dL (ref 0.44–1.00)
GFR calc Af Amer: >60 mL/min (ref >60)
GFR calc non Af Amer: >60 mL/min (ref >60)
Glucose, Bld: 186 mg/dL — ABNORMAL HIGH (ref 70–99)
Potassium: 3.4 mmol/L — ABNORMAL LOW (ref 3.5–5.1)
Sodium: 142 mmol/L (ref 135–145)

## 2019-09-03 LAB — CBC
HCT: 34.8 % — ABNORMAL LOW (ref 36.0–46.0)
Hemoglobin: 11.6 g/dL — ABNORMAL LOW (ref 12.0–15.0)
MCH: 29.8 pg (ref 26.0–34.0)
MCHC: 33.3 g/dL (ref 30.0–36.0)
MCV: 89.5 fL (ref 80.0–100.0)
Platelets: 266 10*3/uL (ref 150–400)
RBC: 3.89 MIL/uL (ref 3.87–5.11)
RDW: 14.4 % (ref 11.5–15.5)
WBC: 10.2 10*3/uL (ref 4.0–10.5)
nRBC: 0 % (ref 0.0–0.2)

## 2019-09-03 LAB — TROPONIN I (HIGH SENSITIVITY)
Troponin I (High Sensitivity): 629 ng/L (ref <18)
Troponin I (High Sensitivity): 4112 ng/L (ref <18)
Troponin I (High Sensitivity): 4887 ng/L (ref <18)

## 2019-09-03 LAB — URINALYSIS, COMPLETE (UACMP) WITH MICROSCOPIC
Bilirubin Urine: NEGATIVE
Glucose, UA: 50 mg/dL — AB
Hgb urine dipstick: NEGATIVE
Ketones, ur: NEGATIVE mg/dL
Leukocytes,Ua: NEGATIVE
Nitrite: NEGATIVE
Protein, ur: NEGATIVE mg/dL
Specific Gravity, Urine: >1.046 — ABNORMAL HIGH (ref 1.005–1.030)
pH: 8 (ref 5.0–8.0)

## 2019-09-03 LAB — ECHOCARDIOGRAM COMPLETE
Height: 62 in
Weight: 1837.75 oz

## 2019-09-03 LAB — LIPID PANEL
Cholesterol: 144 mg/dL (ref 0–200)
HDL: 49 mg/dL (ref >40)
LDL Cholesterol: 89 mg/dL (ref 0–99)
Total CHOL/HDL Ratio: 2.9 RATIO
Triglycerides: 30 mg/dL (ref <150)
VLDL: 6 mg/dL (ref 0–40)

## 2019-09-03 LAB — MRSA PCR SCREENING: MRSA by PCR: NEGATIVE

## 2019-09-03 LAB — HEMOGLOBIN A1C
Hgb A1c MFr Bld: 6.2 % — ABNORMAL HIGH (ref 4.8–5.6)
Mean Plasma Glucose: 131.24 mg/dL

## 2019-09-03 LAB — MAGNESIUM: Magnesium: 1.9 mg/dL (ref 1.7–2.4)

## 2019-09-03 LAB — TSH: TSH: 1.436 u[IU]/mL (ref 0.350–4.500)

## 2019-09-03 LAB — SARS CORONAVIRUS 2 BY RT PCR (HOSPITAL ORDER, PERFORMED IN ~~LOC~~ HOSPITAL LAB): SARS Coronavirus 2: NEGATIVE

## 2019-09-03 LAB — GLUCOSE, CAPILLARY: Glucose-Capillary: 155 mg/dL — ABNORMAL HIGH (ref 70–99)

## 2019-09-03 IMAGING — MR MR HEAD W/O CM
10 series · 48 of 48 positions shown · non-contrast
Comparison: Head CT [DATE]

CLINICAL DATA: Neuro deficit, acute, stroke suspected.

EXAM:
MRI HEAD WITHOUT CONTRAST
TECHNIQUE: Multiplanar, multiecho pulse sequences of the brain and surrounding
structures were obtained without intravenous contrast.

[Series 2: ax dwi_tracew · axial · 3.0mm · 1.31mm/px · z∈[-112,+42]mm · 8 of 48 slices shown]
[im 1/48]
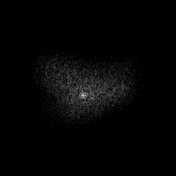
[im 7/48]
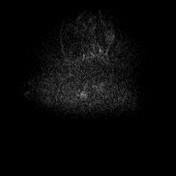
[im 14/48]
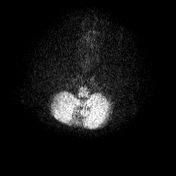
[im 21/48]
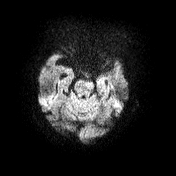
[im 27/48]
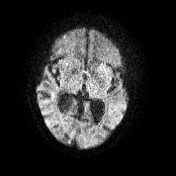
[im 34/48]
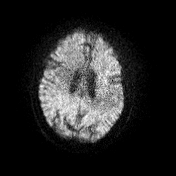
[im 41/48]
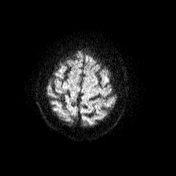
[im 48/48]
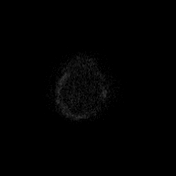

[Series 3: ax dwi_adc · axial · 3.0mm · 1.31mm/px · z∈[-112,+42]mm · 7 of 48 slices shown]
[im 1/48]
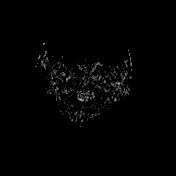
[im 8/48]
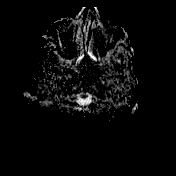
[im 16/48]
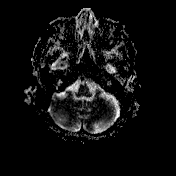
[im 24/48]
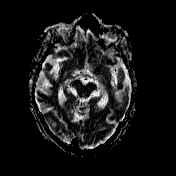
[im 32/48]
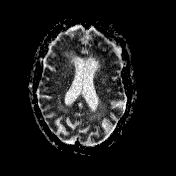
[im 40/48]
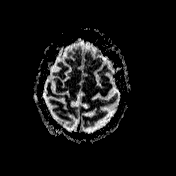
[im 48/48]
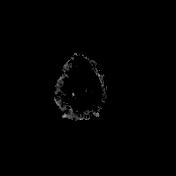

[Series 4: cor dwi_tracew · coronal · 5.0mm · 1.31mm/px · 5 of 38 slices shown]
[im 1/38]
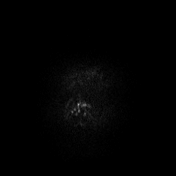
[im 10/38]
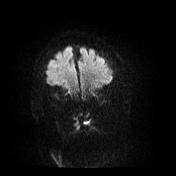
[im 19/38]
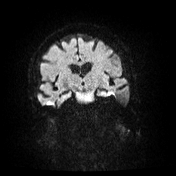
[im 28/38]
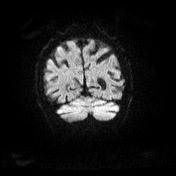
[im 38/38]
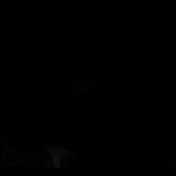

[Series 5: cor dwi_adc · coronal · 5.0mm · 1.31mm/px · 5 of 37 slices shown]
[im 1/37]
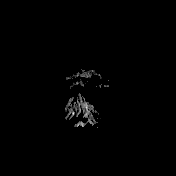
[im 10/37]
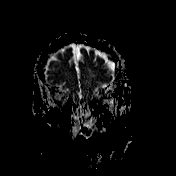
[im 19/37]
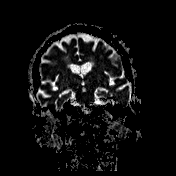
[im 28/37]
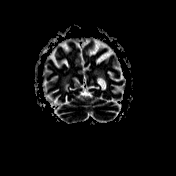
[im 37/37]
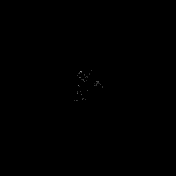

[Series 6: T1 · sagittal · 5.0mm · 0.94mm/px · 3 of 23 slices shown (1 of 2)]
[im 1/23]
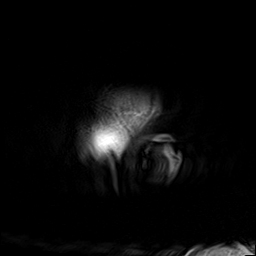
[im 12/23]
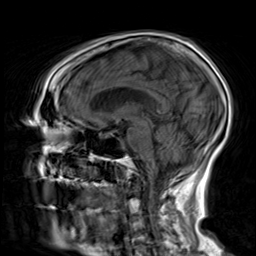
[im 23/23]
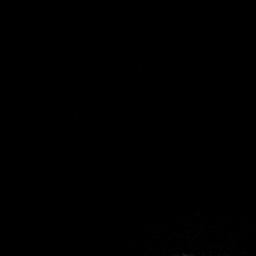

[Series 7: FLAIR · axial · 5.0mm · 1.20mm/px · z∈[-110,+44]mm · 4 of 27 slices shown]
[im 1/27]
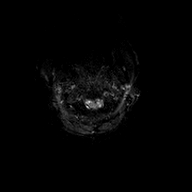
[im 9/27]
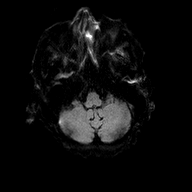
[im 18/27]
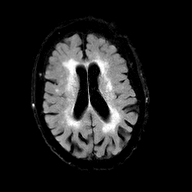
[im 27/27]
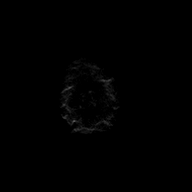

[Series 8: T2 · axial · 5.0mm · 0.45mm/px · z∈[-108,+46]mm · 4 of 27 slices shown (1 of 2)]
[im 1/27]
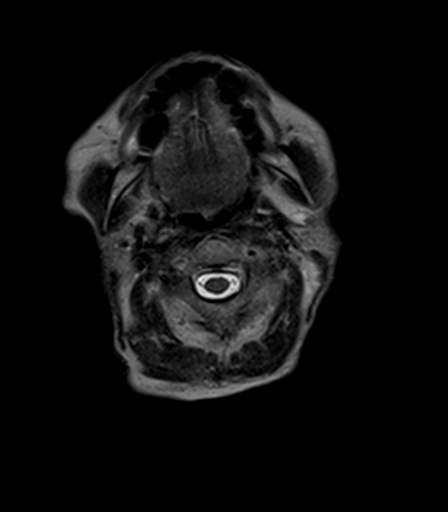
[im 9/27]
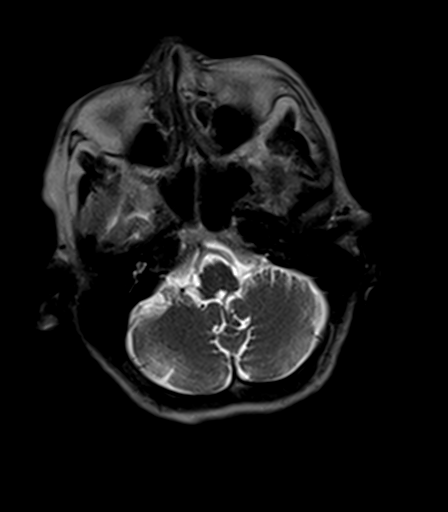
[im 18/27]
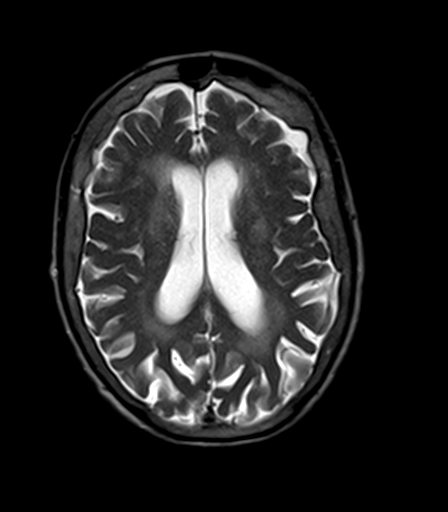
[im 27/27]
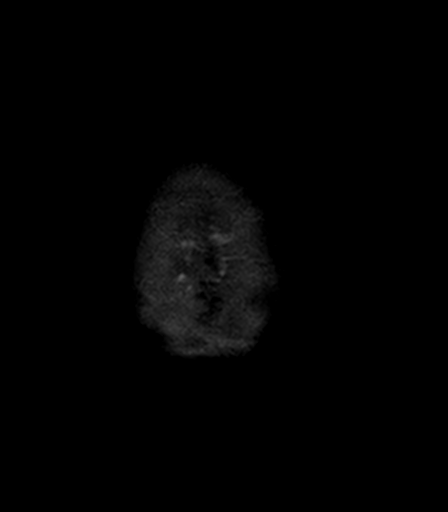

[Series 9: T2-star · axial · 5.0mm · 0.45mm/px · z∈[-108,+46]mm · 4 of 27 slices shown]
[im 1/27]
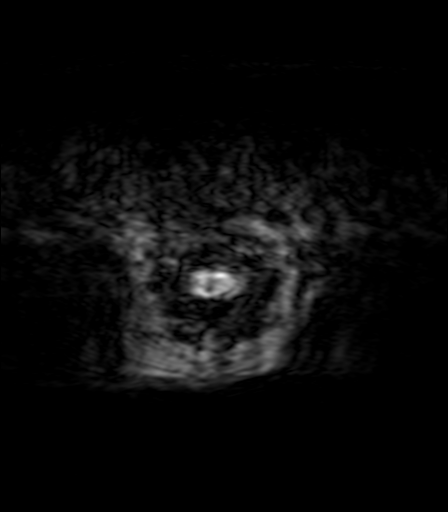
[im 9/27]
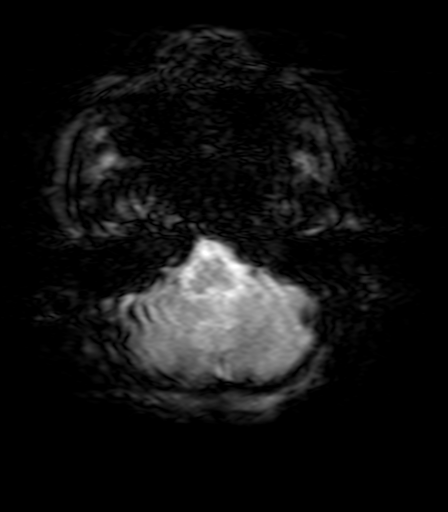
[im 18/27]
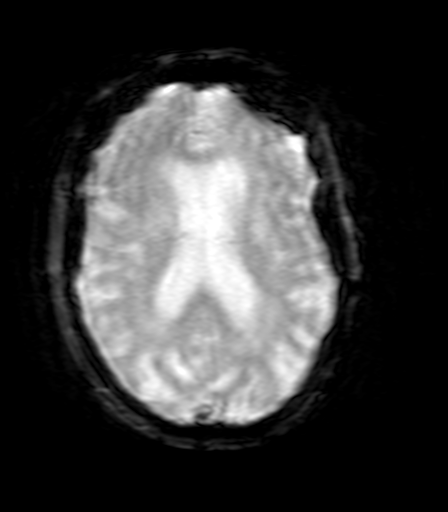
[im 27/27]
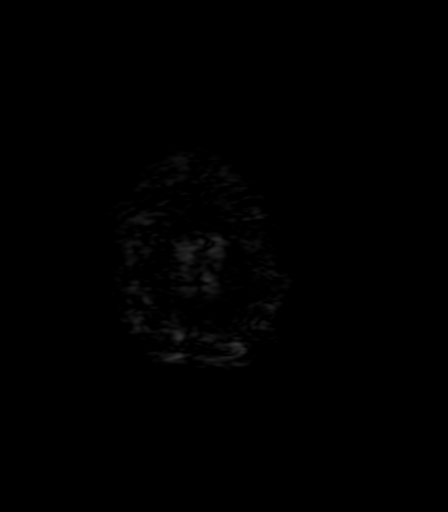

[Series 10: T1 · axial · 5.0mm · 0.90mm/px · z∈[-108,+46]mm · 4 of 27 slices shown (2 of 2)]
[im 1/27]
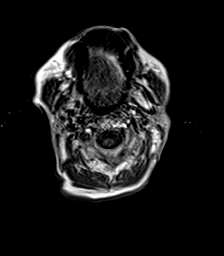
[im 9/27]
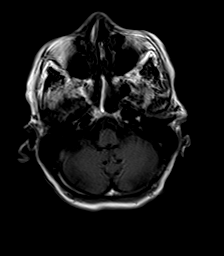
[im 18/27]
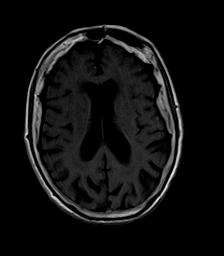
[im 27/27]
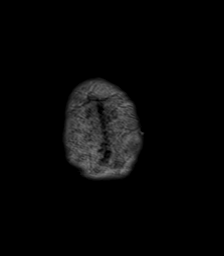

[Series 11: T2 · coronal · 5.0mm · 0.45mm/px · 4 of 31 slices shown (2 of 2)]
[im 1/31]
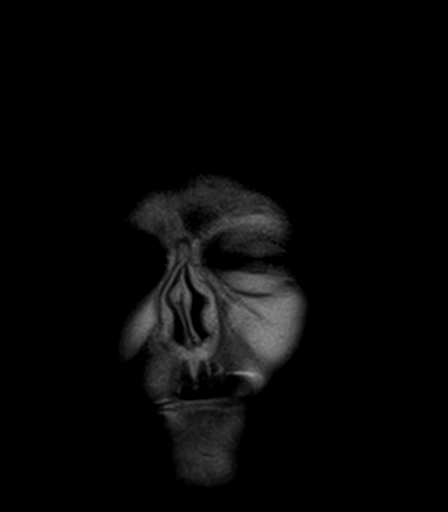
[im 11/31]
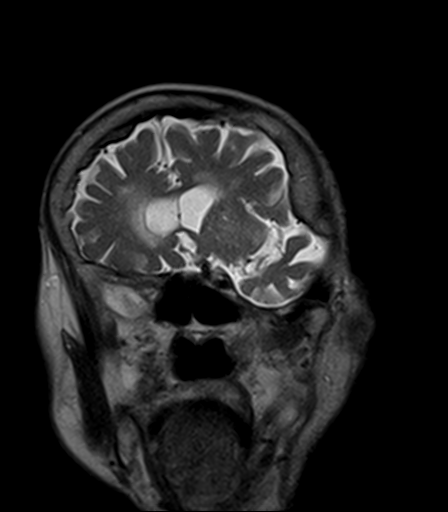
[im 21/31]
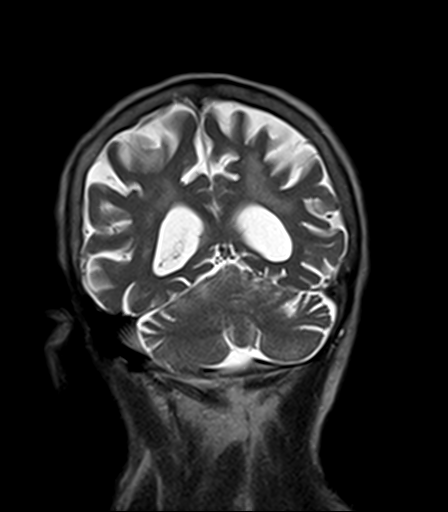
[im 31/31]
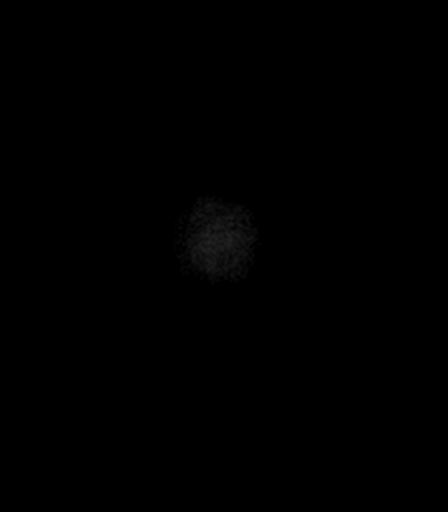

[48 of 48 positions shown; findings below may reference images not displayed]

FINDINGS: The study is significantly degraded by motion.

Brain: No acute infarction, hemorrhage, hydrocephalus, extra-axial
collection or mass lesion.

Small remote infarcts are seen in the bilateral cerebellar
hemispheres and right centrum semiovale. Scattered and confluent
foci of T2 hyperintensity are seen within the white matter of the
cerebral hemispheres, basal ganglia, thalami and pons, nonspecific,
most likely related to chronic small vessel ischemia.

Prominence of the cerebral and cerebellar sulci and supratentorial
ventricles reflecting parenchymal volume loss.

Vascular: Normal flow voids. Ectatic supraclinoid right ICA noted.

Skull and upper cervical spine: Normal marrow signal.

Sinuses/Orbits: Bilateral lens surgery. Paranasal sinuses are
essentially clear.
IMPRESSION: 1. No acute intracranial abnormality.
2. Small remote infarcts in the bilateral cerebellar hemispheres and
right centrum semiovale.
3. Advanced chronic small vessel ischemia and moderate parenchymal
volume loss.

## 2019-09-03 MED ORDER — TICAGRELOR 90 MG PO TABS
ORAL_TABLET | ORAL | Status: AC
  Filled 2019-09-03: qty 1

## 2019-09-03 MED ORDER — HYDRALAZINE HCL 20 MG/ML IJ SOLN: 10.0000 mg | INTRAMUSCULAR | Status: AC | PRN

## 2019-09-03 MED ORDER — NITROGLYCERIN 0.4 MG SL SUBL: 0.4000 mg | SUBLINGUAL_TABLET | SUBLINGUAL | Status: DC | PRN

## 2019-09-03 MED ORDER — ATORVASTATIN CALCIUM 20 MG PO TABS
80.0000 mg | ORAL_TABLET | Freq: Every day | ORAL | Status: DC
  Administered 2019-09-03 – 2019-09-16 (×14): 80 mg via ORAL
  Filled 2019-09-03 (×14): qty 4

## 2019-09-03 MED ORDER — IOHEXOL 300 MG/ML  SOLN
INTRAMUSCULAR | Status: DC | PRN
  Administered 2019-09-03: 150 mL

## 2019-09-03 MED ORDER — ASPIRIN 81 MG PO CHEW
81.0000 mg | CHEWABLE_TABLET | Freq: Every day | ORAL | Status: DC
  Administered 2019-09-03 – 2019-09-16 (×14): 81 mg via ORAL
  Filled 2019-09-03 (×14): qty 1

## 2019-09-03 MED ORDER — MORPHINE SULFATE (PF) 2 MG/ML IV SOLN: 1.0000 mg | INTRAVENOUS | Status: DC | PRN

## 2019-09-03 MED ORDER — TICAGRELOR 90 MG PO TABS
90.0000 mg | ORAL_TABLET | Freq: Two times a day (BID) | ORAL | Status: DC
  Administered 2019-09-03 – 2019-09-16 (×27): 90 mg via ORAL
  Filled 2019-09-03 (×28): qty 1

## 2019-09-03 MED ORDER — TRAZODONE HCL 50 MG PO TABS
25.0000 mg | ORAL_TABLET | Freq: Every evening | ORAL | Status: DC | PRN
  Administered 2019-09-03 – 2019-09-09 (×3): 25 mg via ORAL
  Filled 2019-09-03 (×3): qty 1

## 2019-09-03 MED ORDER — METOPROLOL TARTRATE 25 MG/10 ML ORAL SUSPENSION
12.5000 mg | Freq: Two times a day (BID) | ORAL | Status: DC
  Administered 2019-09-03 – 2019-09-08 (×4): 12.5 mg via ORAL
  Filled 2019-09-03 (×16): qty 5

## 2019-09-03 MED ORDER — CHLORHEXIDINE GLUCONATE CLOTH 2 % EX PADS
6.0000 | MEDICATED_PAD | Freq: Every day | CUTANEOUS | Status: DC
  Administered 2019-09-03 – 2019-09-05 (×3): 6 via TOPICAL

## 2019-09-03 MED ORDER — ACETAMINOPHEN 325 MG PO TABS: 650.0000 mg | ORAL_TABLET | ORAL | Status: DC | PRN

## 2019-09-03 MED ORDER — TICAGRELOR 90 MG PO TABS
ORAL_TABLET | ORAL | Status: DC | PRN
  Administered 2019-09-03: 180 mg via ORAL

## 2019-09-03 MED ORDER — SODIUM CHLORIDE 0.9 % IV SOLN
250.0000 mL | INTRAVENOUS | Status: DC | PRN
  Filled 2019-09-03: qty 250

## 2019-09-03 MED ORDER — ONDANSETRON HCL 4 MG/2ML IJ SOLN: 4.0000 mg | Freq: Four times a day (QID) | INTRAMUSCULAR | Status: DC | PRN

## 2019-09-03 MED ORDER — TRAZODONE HCL 50 MG PO TABS: 25.0000 mg | ORAL_TABLET | Freq: Every day | ORAL | Status: DC

## 2019-09-03 MED ORDER — SODIUM CHLORIDE 0.9 % IV SOLN
4.0000 ug/kg/min | INTRAVENOUS | Status: AC
  Filled 2019-09-03: qty 50

## 2019-09-03 MED ORDER — SODIUM CHLORIDE 0.9% FLUSH
3.0000 mL | Freq: Two times a day (BID) | INTRAVENOUS | Status: DC
  Administered 2019-09-03 – 2019-09-12 (×20): 3 mL via INTRAVENOUS
  Administered 2019-09-13: 10 mL via INTRAVENOUS
  Administered 2019-09-13 – 2019-09-16 (×6): 3 mL via INTRAVENOUS

## 2019-09-03 MED ORDER — LABETALOL HCL 5 MG/ML IV SOLN: 10.0000 mg | INTRAVENOUS | Status: AC | PRN

## 2019-09-03 MED ORDER — HEPARIN (PORCINE) IN NACL 1000-0.9 UT/500ML-% IV SOLN
INTRAVENOUS | Status: DC | PRN
  Administered 2019-09-03: 1000 mL

## 2019-09-03 MED ORDER — SODIUM CHLORIDE 0.9 % IV SOLN: INTRAVENOUS | Status: AC

## 2019-09-03 MED ORDER — POTASSIUM CHLORIDE CRYS ER 20 MEQ PO TBCR
40.0000 meq | EXTENDED_RELEASE_TABLET | Freq: Once | ORAL | Status: AC
  Administered 2019-09-03: 40 meq via ORAL
  Filled 2019-09-03: qty 2

## 2019-09-03 MED ORDER — ENOXAPARIN SODIUM 40 MG/0.4ML ~~LOC~~ SOLN
40.0000 mg | SUBCUTANEOUS | Status: DC
  Administered 2019-09-04 – 2019-09-16 (×13): 40 mg via SUBCUTANEOUS
  Filled 2019-09-03 (×13): qty 0.4

## 2019-09-03 MED ORDER — SODIUM CHLORIDE 0.9% FLUSH
3.0000 mL | INTRAVENOUS | Status: DC | PRN
  Administered 2019-09-04: 3 mL via INTRAVENOUS

## 2019-09-03 NOTE — Progress Notes (Signed)
End of Shift Summary:   Patient exhibits severe dementia.  Alert to self only and very confused.  Patient unable to stay still and is constantly pulling off her EKG monitor, pulse ox, blood pressure cuff and gown.  Bed alarm remains on.  Only one attempt of patient trying to get out of bed.  Patient had an incontinent bowel movement this morning.  Patient has a purewick in place.  Patient went to MRI early this afternoon.  Patient's left radial site remains at a level 1 with a pressure dressing intact.  Vitals remain stable and patient denies pain.  Patient's sister-in-law was in to visit.  Progressive care floor orders were received and we are awaiting a bed.

## 2019-09-03 NOTE — H&P (Addendum)
[facility]   PATIENT NAME: Elizabeth [name]    MR#:  [id number]  DATE OF BIRTH:  1928/02/16  DATE OF ADMISSION:  09/02/2019  PRIMARY CARE PHYSICIAN: Barbette Reichmann, MD   REQUESTING/REFERRING PHYSICIAN: Elio Forget, MD CHIEF COMPLAINT:   Chief Complaint  Patient presents with  . Fall  . Altered Mental Status    HISTORY OF PRESENT ILLNESS:  Elizabeth [name]  is a 86 y.o. Caucasian female with a known history of dementia, who presented to the emergency room with acute onset of altered mental status with decreased responsiveness and syncope.  The patient is a nonhistorian and history at this time is obtained from her chart and reported history from her sister-in-law.  The patient was seeing her driveway around 7 PM when she was found shortly after that by a neighbor lying on the ground.  It is unclear how long she was down or how she fell.  Initially her caregivers spend the day with her as well as her sister-in-law noticed right-sided weakness over the last week though the patient denied any symptoms.  Upon presentation to the emergency room, vital signs were within normal.  Labs are remarkable for received urinalysis with more than 1046 to gravity, rare bacteria and 11-20 RBCs and 11-20 WBCs with negative ketones and negative nitrites.  Labs otherwise were remarkable for mild hypokalemia 3.4 and significantly elevated high-sensitivity troponin I of 629 it was initially 148.   CBC showed leukocytosis 11.5 and mild anemia.    The patient was placed on aspirin, hydration with normal saline, high-dose statin therapy, Kengreal, and Brilinta.  She she was taken to the Cath Lab and underwent PCI with 3 stents placed in her RCA. EKG showed acute inferior ST elevation infarct and a rate of 80.  The patient will be admitted to an ICU bed for further evaluation and management. PAST MEDICAL HISTORY:   Past Medical History:  Diagnosis Date  . Dementia ([facility])      PAST SURGICAL HISTORY:  History reviewed. No pertinent surgical history.  PCI and 3 stents placement in the RCA.  She does not recall any other surgeries.  SOCIAL HISTORY:   Social History   Tobacco Use  . Smoking status: Never Smoker  . Smokeless tobacco: Never Used  Substance Use Topics  . Alcohol use: Never    FAMILY HISTORY:  No reported familial diseases.  She did not recall familial diseases (was unobtainable due to her dementia.  DRUG ALLERGIES:  No Known Allergies  REVIEW OF SYSTEMS:   ROS As per history of present illness. All pertinent systems were reviewed above. Constitutional,  HEENT, cardiovascular, respiratory, GI, GU, musculoskeletal, neuro, psychiatric, endocrine,  integumentary and hematologic systems were reviewed and are otherwise  negative/unremarkable except for positive findings mentioned above in the HPI.   MEDICATIONS AT HOME:   Prior to Admission medications   Not on File      VITAL SIGNS:  Blood pressure 121/65, pulse 71, temperature 97.6 F (36.4 C), temperature source Axillary, resp. rate (!) 25, height 5\' 2"  (1.575 m), weight 52.1 kg, SpO2 98 %.  PHYSICAL EXAMINATION:  Physical Exam  GENERAL:  86 y.o.-year-old pleasantly demented Caucasian female patient lying in the bed with no acute distress.  EYES: Pupils equal, round, reactive to light and accommodation. No scleral icterus. Extraocular muscles intact.  HEENT: Head atraumatic, normocephalic. Oropharynx and nasopharynx clear.  NECK:  Supple, no jugular venous distention. No thyroid enlargement, no tenderness.  LUNGS:  Normal breath sounds bilaterally, no wheezing, rales,rhonchi or crepitation. No use of accessory muscles of respiration.  CARDIOVASCULAR: Regular rate and rhythm, S1, S2 normal. No murmurs, rubs, or gallops.  ABDOMEN: Soft, nondistended, nontender. Bowel sounds present. No organomegaly or mass.  EXTREMITIES: No pedal edema, cyanosis, or clubbing.  NEUROLOGIC:  Cranial nerves II through XII are intact. Muscle strength 5/5 in all extremities. Sensation intact. Gait not checked.  PSYCHIATRIC: The patient is alert and oriented x 3.  Normal affect and good eye contact. SKIN: No obvious rash, lesion, or ulcer.   LABORATORY PANEL:   CBC Recent Labs  Lab 09/03/19 0115  WBC 10.2  HGB 11.6*  HCT 34.8*  PLT 266   ------------------------------------------------------------------------------------------------------------------  Chemistries  Recent Labs  Lab 09/03/19 0115  NA 142  K 3.4*  CL 109  CO2 26  GLUCOSE 186*  BUN 26*  CREATININE 0.63  CALCIUM 8.4*   ------------------------------------------------------------------------------------------------------------------  Cardiac Enzymes No results for input(s): TROPONINI in the last 168 hours. ------------------------------------------------------------------------------------------------------------------  RADIOLOGY:  CT Head Wo Contrast  Result Date: 09/02/2019 CLINICAL DATA:  86 year old post unwitnessed fall. EXAM: CT HEAD WITHOUT CONTRAST TECHNIQUE: Contiguous axial images were obtained from the base of the skull through the vertex without intravenous contrast. COMPARISON:  Head CT 06/26/2017 FINDINGS: Brain: No intracranial hemorrhage, mass effect, or midline shift. Stable degree of atrophy and chronic small vessel ischemia. No hydrocephalus. The basilar cisterns are patent. Remote left cerebellar infarct is unchanged. No evidence of territorial infarct or acute ischemia. No extra-axial or intracranial fluid collection. Vascular: Atherosclerosis of skullbase vasculature without hyperdense vessel or abnormal calcification. Skull: No fracture or focal lesion. Chronic bifrontal hyperostosis. Sinuses/Orbits: Paranasal sinuses and mastoid air cells are clear. The visualized orbits are unremarkable. Bilateral cataract resection. Other: None. IMPRESSION: 1. No acute intracranial abnormality. No  skull fracture. 2. Stable atrophy and chronic small vessel ischemia. Remote left cerebellar infarct. Electronically Signed   By: Narda Rutherford M.D.   On: 09/02/2019 21:33   CT Cervical Spine Wo Contrast  Result Date: 09/02/2019 CLINICAL DATA:  Post unwitnessed fall. EXAM: CT CERVICAL SPINE WITHOUT CONTRAST TECHNIQUE: Multidetector CT imaging of the cervical spine was performed without intravenous contrast. Multiplanar CT image reconstructions were also generated. COMPARISON:  None. FINDINGS: Alignment: Trace anterolisthesis of C7 on T1 is likely degenerative. No traumatic subluxation. Skull base and vertebrae: No acute fracture. Cervical vertebral body heights are preserved. Minor superior endplate compression deformities of T1 and T2. the dens and skull base are intact. No evidence of focal lesion. Soft tissues and spinal canal: No prevertebral fluid or swelling. No visible canal hematoma. Disc levels: Diffuse multilevel degenerative disc disease with disc space narrowing and endplate spurring. There is prominent facet hypertrophy throughout. Upper chest: Minor septal thickening at the apices and may represent pulmonary edema. Other: None. IMPRESSION: 1. No acute fracture or traumatic subluxation of the cervical spine. 2. Multilevel degenerative disc disease and facet hypertrophy. 3. Minor chronic appearing superior endplate compression deformities of T1 and T2. 4. Possible pulmonary edema at the lung apices. Electronically Signed   By: Narda Rutherford M.D.   On: 09/02/2019 21:37   CARDIAC CATHETERIZATION  Result Date: 09/03/2019 Conclusions: 1. Severe single-vessel coronary artery disease with multifocal areas of severe stenosis involving the RCA.  This likely represents extensive plaquing with acute plaque rupture and thrombus formation consistent with type I MI. 2. Mild, nonobstructive coronary artery disease involving the LAD and LCx. 3. Grossly normal left ventricular systolic function with mildly  elevated filling pressure. 4. Successful PCI to the proximal through distal RCA using overlapping synergy 4.0 x 12 mm (proximal) 4.0 x 32 mm (mid) and 4.0 x 38 mm (distal) drug-eluting stents with 0% residual stenosis and TIMI-3 flow. Recommendations: 1. Admit to ICU for post STEMI care/monitoring. 2. Continue cangrelor for 1 hour post PCI. 3. Dual antiplatelet therapy for at least 12 months.  I suggest continuing aspirin and ticagrelor for one month, at which time we could consider switching ticagrelor to clopidogrel to lessen the risk for bleeding. 4. Aggressive secondary prevention, including high intensity statin therapy. Nelva Bush, MD [facility]      IMPRESSION AND PLAN:   1.  Infero-lateral ST segment elevation MI. -Patient was admitted to an ICU bed status post PCI and 3 stents placement in his RCA. -Pain management will be provided. -We will continue aspirin, Brilinta, Kengreal and add beta-blocker therapy with Lopressor. -We will continue high-dose statin therapy. -Cardiology consultation will be obtained for follow-up. -2D echo will be obtained in a.m. -The patient will be placed on as needed IV morphine sulfate and sublingual nitroglycerin for pain -I notified Dr. Caryl Comes about the patient.  2.  Essential hypertension. -We will continue antihypertensives.  3.  Dementia. -No current behavioral changes.  4.  Right-sided weakness. -We will obtain a brain MRI without contrast this a.m. for further assessment.  5.  DVT prophylaxis. -Subcutaneous Lovenox.  All the records are reviewed and case discussed with ED provider. The plan of care was discussed in details with the patient (and family). I answered all questions. The patient agreed to proceed with the above mentioned plan. Further management will depend upon hospital course.   CODE STATUS: Full code  Status is: Inpatient  Remains inpatient appropriate because:Altered mental status, Ongoing diagnostic testing  needed not appropriate for outpatient work up, Unsafe d/c plan, IV treatments appropriate due to intensity of illness or inability to take PO and Inpatient level of care appropriate due to severity of illness   Dispo: The patient is from: Home              Anticipated d/c is to: Home              Anticipated d/c date is: 2 days              Patient currently is not medically stable to d/c.    TOTAL TIME TAKING CARE OF THIS PATIENT: 55 minutes.    Christel Mormon M.D on 09/03/2019 at 4:33 AM  [facility]   From 7 PM-7 AM, contact night-coverage [email or web address]  CC: Primary care physician; Tracie Harrier, MD   Note: This dictation was prepared with Dragon dictation along with smaller phrase technology. Any transcriptional errors that result from this process are unintentional.

## 2019-09-03 NOTE — Progress Notes (Signed)
*  PRELIMINARY RESULTS* Echocardiogram 2D Echocardiogram has been performed.  Elizabeth [name] 09/03/2019, 12:47 PM

## 2019-09-03 NOTE — Progress Notes (Signed)
Progress Note  Patient Name: Elizabeth [name] Date of Encounter: 09/03/2019  Primary Cardiologist: Nelva Bush, MD  Subjective   No c/p or shortness of breath.  Disoriented to time/place.   Inpatient Medications    Scheduled Meds: . aspirin  81 mg Oral Daily  . atorvastatin  80 mg Oral Daily  . Chlorhexidine Gluconate Cloth  6 each Topical Daily  . [START ON 09/04/2019] enoxaparin (LOVENOX) injection  40 mg Subcutaneous Q24H  . metoprolol tartrate  12.5 mg Oral BID  . sodium chloride flush  3 mL Intravenous Q12H  . ticagrelor  90 mg Oral BID   Continuous Infusions: . sodium chloride     PRN Meds: sodium chloride, acetaminophen, morphine injection, nitroGLYCERIN, ondansetron (ZOFRAN) IV, sodium chloride flush   Vital Signs    Vitals:   09/03/19 0415 09/03/19 0500 09/03/19 0515 09/03/19 0615  BP: 114/63  (!) 103/58 120/63  Pulse: 75 (!) 144 69   Resp: 17 (!) 23 18 16   Temp:      TempSrc:      SpO2: 97% (!) 88% 97%   Weight:      Height:        Intake/Output Summary (Last 24 hours) at 09/03/2019 0746 Last data filed at 09/03/2019 0612 Gross per 24 hour  Intake 203.21 ml  Output 100 ml  Net 103.21 ml   Filed Weights   09/02/19 2043 09/03/19 0110  Weight: 49 kg 52.1 kg    Physical Exam   GEN: frail, in no acute distress.  HEENT: Grossly normal.  Neck: Supple, no JVD, carotid bruits, or masses. Cardiac: RRR, no murmurs, rubs, or gallops. No clubbing, cyanosis, edema.  Radials/DP/PT 2+ and equal bilaterally. Left radial cath site w/o bleeding/bruit/hematoma. Respiratory:  Respirations regular and unlabored, clear to auscultation bilaterally. GI: Soft, nontender, nondistended, BS + x 4. MS: no deformity or atrophy. Skin: warm and dry, no rash. Neuro:  Strength and sensation are intact. Psych: Disoriented to time/place.  Normal affect.  Labs    Chemistry Recent Labs  Lab 09/02/19 2049 09/03/19 0115  NA 145 142  K 3.4* 3.4*  CL 110 109  CO2  25 26  GLUCOSE 212* 186*  BUN 33* 26*  CREATININE 0.73 0.63  CALCIUM 8.8* 8.4*  GFRNONAA >60 >60  GFRAA >60 >60  ANIONGAP 10 7     Hematology Recent Labs  Lab 09/02/19 2049 09/03/19 0115  WBC 11.5* 10.2  RBC 3.82* 3.89  HGB 11.3* 11.6*  HCT 34.2* 34.8*  MCV 89.5 89.5  MCH 29.6 29.8  MCHC 33.0 33.3  RDW 14.3 14.4  PLT 282 266    Cardiac Enzymes  Recent Labs  Lab 09/02/19 2049 09/03/19 0115  TROPONINIHS 148* 629*     Lab Results  Component Value Date   CHOL 144 09/03/2019   HDL 49 09/03/2019   LDLCALC 89 09/03/2019   TRIG 30 09/03/2019   CHOLHDL 2.9 09/03/2019     Radiology    CT Head Wo Contrast  Result Date: 09/02/2019 CLINICAL DATA:  86 year old post unwitnessed fall. EXAM: CT HEAD WITHOUT CONTRAST TECHNIQUE: Contiguous axial images were obtained from the base of the skull through the vertex without intravenous contrast. COMPARISON:  Head CT 06/26/2017 FINDINGS: Brain: No intracranial hemorrhage, mass effect, or midline shift. Stable degree of atrophy and chronic small vessel ischemia. No hydrocephalus. The basilar cisterns are patent. Remote left cerebellar infarct is unchanged. No evidence of territorial infarct or acute ischemia. No extra-axial or intracranial fluid  collection. Vascular: Atherosclerosis of skullbase vasculature without hyperdense vessel or abnormal calcification. Skull: No fracture or focal lesion. Chronic bifrontal hyperostosis. Sinuses/Orbits: Paranasal sinuses and mastoid air cells are clear. The visualized orbits are unremarkable. Bilateral cataract resection. Other: None. IMPRESSION: 1. No acute intracranial abnormality. No skull fracture. 2. Stable atrophy and chronic small vessel ischemia. Remote left cerebellar infarct. Electronically Signed   By: Narda Rutherford M.D.   On: 09/02/2019 21:33   CT Cervical Spine Wo Contrast  Result Date: 09/02/2019 CLINICAL DATA:  Post unwitnessed fall. EXAM: CT CERVICAL SPINE WITHOUT CONTRAST TECHNIQUE:  Multidetector CT imaging of the cervical spine was performed without intravenous contrast. Multiplanar CT image reconstructions were also generated. COMPARISON:  None. FINDINGS: Alignment: Trace anterolisthesis of C7 on T1 is likely degenerative. No traumatic subluxation. Skull base and vertebrae: No acute fracture. Cervical vertebral body heights are preserved. Minor superior endplate compression deformities of T1 and T2. the dens and skull base are intact. No evidence of focal lesion. Soft tissues and spinal canal: No prevertebral fluid or swelling. No visible canal hematoma. Disc levels: Diffuse multilevel degenerative disc disease with disc space narrowing and endplate spurring. There is prominent facet hypertrophy throughout. Upper chest: Minor septal thickening at the apices and may represent pulmonary edema. Other: None. IMPRESSION: 1. No acute fracture or traumatic subluxation of the cervical spine. 2. Multilevel degenerative disc disease and facet hypertrophy. 3. Minor chronic appearing superior endplate compression deformities of T1 and T2. 4. Possible pulmonary edema at the lung apices. Electronically Signed   By: Narda Rutherford M.D.   On: 09/02/2019 21:37    Telemetry    RSR, rare PVC - Personally Reviewed  ECG    RSR, 76, 79mm ST elevation, II, III, aVF, V4-V6. - Personally Reviewed  Cardiac Studies   Cardiac Catheterization and Percutaneous Coronary Intervention 5.25.2021  Left Anterior Descending  Vessel is moderate in size.  Mid LAD lesion 30% stenosed  Mid LAD lesion is 30% stenosed.  Mid LAD to Dist LAD lesion 20% stenosed  Mid LAD to Dist LAD lesion is 20% stenosed.  First Diagonal Branch  Vessel is small in size.  Second Diagonal Branch  Vessel is small in size.  Third Diagonal Branch  Vessel is large in size.  Left Circumflex  Vessel is large.  Prox Cx lesion 30% stenosed  Prox Cx lesion is 30% stenosed.  Mid Cx to Dist Cx lesion 30% stenosed  Mid Cx to Dist Cx  lesion is 30% stenosed.  First Obtuse Marginal Branch  Vessel is small in size.  Second Obtuse Marginal Branch  Vessel is moderate in size.  Third Obtuse Marginal Branch  Vessel is small in size.  Right Coronary Artery  Vessel is large.  Prox RCA lesion 70% stenosed  Prox RCA lesion is 70% stenosed. The lesion is thrombotic.  Mid RCA lesion 90% stenosed  Mid RCA lesion is 90% stenosed. The lesion is thrombotic.  Dist RCA lesion 70% stenosed  Dist RCA lesion is 70% stenosed. The lesion is thrombotic. **overlapping synergy 4.0 x 12 mm (proximal) 4.0 x 32 mm (mid) and 4.0 x 38 mm (distal) drug-eluting stents   Right Posterior Descending Artery  Vessel is moderate in size.  Right Posterior Atrioventricular Artery  Vessel is large in size.   Ventricle The left ventricular systolic function is normal. LV end diastolic pressure is mildly elevated. LVEDP 15-20 mmHg. The left ventricular ejection fraction is greater than 65% by visual estimate.    Patient Profile  86 y.o. female w/ a h/o dementia, who was admitted 5/25 following presentation with syncope and inferiolateral STEMI. Now s/p PCI/DES x 3 to the RCA.  Assessment & Plan    1.  Inferolateral STEMI/CAD:  Pt presented following syncopal episode w/ report of right sided weakness.  Head CT w/ acute finding.  ECG w/ inferolateral ST elevation and HsTrop initially 148.  Cath revealed severe prox/mid/distal RCA dzs, which was treated w/ DES x 3.  HsTrop  Subsequently 629.  No c/p or dyspnea this AM. Cath site looks good.  Cont asa, statin,  blocker, brilinta. Echo pending.  Can likely move out of ICU this afternoon.  2.  Right-sided weakness:  Reported by POA on arrival.  Head CT w/o acute finding.  No current weakness.  Consider MRI.  3.  Lipids: LDL 89.  High potency statin added.  Will f/u as outpt.  4.  Hypokalemia: Supp.  5.  Dementia:  Disoriented but pleasant/cooperative.  Signed, Nicolasa Ducking, NP  09/03/2019, 7:46  AM    For questions or updates, please contact   Please consult [email or web address] for contact info under Cardiology/STEMI.

## 2019-09-03 NOTE — Progress Notes (Signed)
PROGRESS NOTE    Elizabeth [name]  RWE:[id number] DOB: 02-Feb-1928 DOA: 09/02/2019 PCP: Tracie Harrier, MD   Chief Complaint  Patient presents with  . Fall  . Altered Mental Status    Brief Narrative:  86 year old female with history of severe dementia presented with altered mental status, syncope and poor responsiveness.  Patient was seen on her driveway on the evening of admission and then found shortly by her neighbor lying on the ground for unknown duration.  Her caregiver and sister-in-law had noticed that she had some right-sided weakness for the past 1 week. In the ED patient was found to have potassium of 3.4 and markedly elevated high sensitive troponin of 629.  EKG showed acute inferior ST elevation infarct. Patient started on aspirin, high-dose statin, Kengreal and Brilinta and taken to Cath Lab urgently and had 3 drug-eluting stent to the RCA.  Tolerated procedure well.    Assessment & Plan:   Principal problem Inferior STEMI S/p PCI x3 to RCA.  Cardiology recommends dual antiplatelet therapy for 12 months.  Currently on aspirin and Brilinta for now and possibly switch Brilinta to Plavix after 1 month. Continue Lipitor and Lopressor. Pending 2D echo results.  Active problems Right-sided weakness Unclear duration.  MRI brain ordered and pending result.  Dementia, moderate to severe Seems at baseline.  Monitor.  Essential hypertension Continue current meds  Transfer patient to telemetry.  DVT prophylaxis: Subcu Lovenox Code Status: Full code Family Communication: None at bedside Disposition:   Status is: Inpatient  Remains inpatient appropriate because:Ongoing diagnostic testing needed not appropriate for outpatient work up   Dispo: The patient is from: Home              Anticipated d/c is to: Home              Anticipated d/c date is: 1 day              Patient currently is not medically stable to d/c.        Consultants:    Cardiology   Procedures:  Cardiac cath, 2D echo, MRI brain  Antimicrobials:  Subjective: Seen and examined.  Denies any chest pain or discomfort.  No she is in the hospital but cannot describe further.  Objective: Vitals:   09/03/19 0515 09/03/19 0615 09/03/19 0700 09/03/19 0800  BP: (!) 103/58 120/63 (!) 137/121 (!) 102/50  Pulse: 69  74   Resp: 18 16 (!) 22 14  Temp:    (!) 97.5 F (36.4 C)  TempSrc:    Axillary  SpO2: 97%  98%   Weight:      Height:        Intake/Output Summary (Last 24 hours) at 09/03/2019 0911 Last data filed at 09/03/2019 6195 Gross per 24 hour  Intake 203.21 ml  Output 100 ml  Net 103.21 ml   Filed Weights   09/02/19 2043 09/03/19 0110  Weight: 49 kg 52.1 kg    Examination:  General: Elderly female not in distress HEENT: Moist mucosa, supple neck Chest: Clear bilaterally CVs: Normal S1-S2, no murmur rub or gallop GI: Soft, nondistended, nontender Musculoskeletal: Warm, no edema, left radial cath site without bleeding or hematoma     Data Reviewed: I have personally reviewed following labs and imaging studies  CBC: Recent Labs  Lab 09/02/19 2049 09/03/19 0115  WBC 11.5* 10.2  HGB 11.3* 11.6*  HCT 34.2* 34.8*  MCV 89.5 89.5  PLT 282 093    Basic Metabolic Panel: Recent  Labs  Lab 09/02/19 2049 09/03/19 0115  NA 145 142  K 3.4* 3.4*  CL 110 109  CO2 25 26  GLUCOSE 212* 186*  BUN 33* 26*  CREATININE 0.73 0.63  CALCIUM 8.8* 8.4*    GFR: Estimated Creatinine Clearance: 37 mL/min (by C-G formula based on SCr of 0.63 mg/dL).  Liver Function Tests: No results for input(s): AST, ALT, ALKPHOS, BILITOT, PROT, ALBUMIN in the last 168 hours.  CBG: Recent Labs  Lab 09/03/19 0059  GLUCAP 155*     Recent Results (from the past 240 hour(s))  SARS Coronavirus 2 by RT PCR (hospital order, performed in [facility] hospital lab) Nasopharyngeal Nasopharyngeal Swab     Status: None   Collection Time: 09/02/19 10:12 PM    Specimen: Nasopharyngeal Swab  Result Value Ref Range Status   SARS Coronavirus 2 NEGATIVE NEGATIVE Final    Comment: (NOTE) SARS-CoV-2 target nucleic acids are NOT DETECTED. The SARS-CoV-2 RNA is generally detectable in upper and lower respiratory specimens during the acute phase of infection. The lowest concentration of SARS-CoV-2 viral copies this assay can detect is 250 copies / mL. A negative result does not preclude SARS-CoV-2 infection and should not be used as the sole basis for treatment or other patient management decisions.  A negative result may occur with improper specimen collection / handling, submission of specimen other than nasopharyngeal swab, presence of viral mutation(s) within the areas targeted by this assay, and inadequate number of viral copies (<250 copies / mL). A negative result must be combined with clinical observations, patient history, and epidemiological information. Fact Sheet for Patients:   BoilerBrush.com.cy Fact Sheet for Healthcare Providers: https://pope.com/ This test is not yet approved or cleared  by the [place] FDA and has been authorized for detection and/or diagnosis of SARS-CoV-2 by FDA under an Emergency Use Authorization (EUA).  This EUA will remain in effect (meaning this test can be used) for the duration of the COVID-19 declaration under Section 564(b)(1) of the Act, 21 U.S.C. section 360bbb-3(b)(1), unless the authorization is terminated or revoked sooner. Performed at [facility], [address] [place], [place] [address]   MRSA PCR Screening     Status: None   Collection Time: 09/03/19  1:02 AM   Specimen: Nasopharyngeal  Result Value Ref Range Status   MRSA by PCR NEGATIVE NEGATIVE Final    Comment:        The GeneXpert MRSA Assay (FDA approved for NASAL specimens only), is one component of a comprehensive MRSA colonization surveillance program. It is  not intended to diagnose MRSA infection nor to guide or monitor treatment for MRSA infections. Performed at [facility], [address], [place], [place] [address]          Radiology Studies: CT Head Wo Contrast  Result Date: 09/02/2019 CLINICAL DATA:  86 year old post unwitnessed fall. EXAM: CT HEAD WITHOUT CONTRAST TECHNIQUE: Contiguous axial images were obtained from the base of the skull through the vertex without intravenous contrast. COMPARISON:  Head CT 06/26/2017 FINDINGS: Brain: No intracranial hemorrhage, mass effect, or midline shift. Stable degree of atrophy and chronic small vessel ischemia. No hydrocephalus. The basilar cisterns are patent. Remote left cerebellar infarct is unchanged. No evidence of territorial infarct or acute ischemia. No extra-axial or intracranial fluid collection. Vascular: Atherosclerosis of skullbase vasculature without hyperdense vessel or abnormal calcification. Skull: No fracture or focal lesion. Chronic bifrontal hyperostosis. Sinuses/Orbits: Paranasal sinuses and mastoid air cells are clear. The visualized orbits are unremarkable.  Bilateral cataract resection. Other: None. IMPRESSION: 1. No acute intracranial abnormality. No skull fracture. 2. Stable atrophy and chronic small vessel ischemia. Remote left cerebellar infarct. Electronically Signed   By: Narda Rutherford M.D.   On: 09/02/2019 21:33   CT Cervical Spine Wo Contrast  Result Date: 09/02/2019 CLINICAL DATA:  Post unwitnessed fall. EXAM: CT CERVICAL SPINE WITHOUT CONTRAST TECHNIQUE: Multidetector CT imaging of the cervical spine was performed without intravenous contrast. Multiplanar CT image reconstructions were also generated. COMPARISON:  None. FINDINGS: Alignment: Trace anterolisthesis of C7 on T1 is likely degenerative. No traumatic subluxation. Skull base and vertebrae: No acute fracture. Cervical vertebral body heights are preserved. Minor superior endplate compression  deformities of T1 and T2. the dens and skull base are intact. No evidence of focal lesion. Soft tissues and spinal canal: No prevertebral fluid or swelling. No visible canal hematoma. Disc levels: Diffuse multilevel degenerative disc disease with disc space narrowing and endplate spurring. There is prominent facet hypertrophy throughout. Upper chest: Minor septal thickening at the apices and may represent pulmonary edema. Other: None. IMPRESSION: 1. No acute fracture or traumatic subluxation of the cervical spine. 2. Multilevel degenerative disc disease and facet hypertrophy. 3. Minor chronic appearing superior endplate compression deformities of T1 and T2. 4. Possible pulmonary edema at the lung apices. Electronically Signed   By: Narda Rutherford M.D.   On: 09/02/2019 21:37   CARDIAC CATHETERIZATION  Result Date: 09/03/2019 Conclusions: 1. Severe single-vessel coronary artery disease with multifocal areas of severe stenosis involving the RCA.  This likely represents extensive plaquing with acute plaque rupture and thrombus formation consistent with type I MI. 2. Mild, nonobstructive coronary artery disease involving the LAD and LCx. 3. Grossly normal left ventricular systolic function with mildly elevated filling pressure. 4. Successful PCI to the proximal through distal RCA using overlapping synergy 4.0 x 12 mm (proximal) 4.0 x 32 mm (mid) and 4.0 x 38 mm (distal) drug-eluting stents with 0% residual stenosis and TIMI-3 flow. Recommendations: 1. Admit to ICU for post STEMI care/monitoring. 2. Continue cangrelor for 1 hour post PCI. 3. Dual antiplatelet therapy for at least 12 months.  I suggest continuing aspirin and ticagrelor for one month, at which time we could consider switching ticagrelor to clopidogrel to lessen the risk for bleeding. 4. Aggressive secondary prevention, including high intensity statin therapy. Yvonne Kendall, MD [facility]        Scheduled Meds: . aspirin  81 mg Oral Daily   . atorvastatin  80 mg Oral Daily  . Chlorhexidine Gluconate Cloth  6 each Topical Daily  . [START ON 09/04/2019] enoxaparin (LOVENOX) injection  40 mg Subcutaneous Q24H  . metoprolol tartrate  12.5 mg Oral BID  . sodium chloride flush  3 mL Intravenous Q12H  . ticagrelor  90 mg Oral BID   Continuous Infusions: . sodium chloride       LOS: 0 days    Time spent: 25 minutes    Lev Cervone, MD [facility]   To contact the attending provider between 7A-7P or the covering provider during after hours 7P-7A, please log into the web site [email or web address] and access using universal [place] password for that web site. If you do not have the password, please call the hospital operator.  09/03/2019, 9:11 AM

## 2019-09-04 DIAGNOSIS — W19XXXA Unspecified fall, initial encounter: Secondary | ICD-10-CM

## 2019-09-04 DIAGNOSIS — F0151 Vascular dementia with behavioral disturbance: Secondary | ICD-10-CM

## 2019-09-04 DIAGNOSIS — E876 Hypokalemia: Secondary | ICD-10-CM

## 2019-09-04 DIAGNOSIS — R55 Syncope and collapse: Secondary | ICD-10-CM

## 2019-09-04 DIAGNOSIS — F05 Delirium due to known physiological condition: Secondary | ICD-10-CM

## 2019-09-04 LAB — BASIC METABOLIC PANEL
Anion gap: 7 (ref 5–15)
BUN: 15 mg/dL (ref 8–23)
CO2: 25 mmol/L (ref 22–32)
Calcium: 8.4 mg/dL — ABNORMAL LOW (ref 8.9–10.3)
Chloride: 107 mmol/L (ref 98–111)
Creatinine, Ser: 0.5 mg/dL (ref 0.44–1.00)
GFR calc Af Amer: >60 mL/min (ref >60)
GFR calc non Af Amer: >60 mL/min (ref >60)
Glucose, Bld: 110 mg/dL — ABNORMAL HIGH (ref 70–99)
Potassium: 3.2 mmol/L — ABNORMAL LOW (ref 3.5–5.1)
Sodium: 139 mmol/L (ref 135–145)

## 2019-09-04 LAB — CBC
HCT: 35.2 % — ABNORMAL LOW (ref 36.0–46.0)
Hemoglobin: 11.6 g/dL — ABNORMAL LOW (ref 12.0–15.0)
MCH: 29.2 pg (ref 26.0–34.0)
MCHC: 33 g/dL (ref 30.0–36.0)
MCV: 88.7 fL (ref 80.0–100.0)
Platelets: 257 10*3/uL (ref 150–400)
RBC: 3.97 MIL/uL (ref 3.87–5.11)
RDW: 14.1 % (ref 11.5–15.5)
WBC: 6.9 10*3/uL (ref 4.0–10.5)
nRBC: 0 % (ref 0.0–0.2)

## 2019-09-04 LAB — MAGNESIUM: Magnesium: 1.8 mg/dL (ref 1.7–2.4)

## 2019-09-04 MED ORDER — HALOPERIDOL LACTATE 5 MG/ML IJ SOLN
1.0000 mg | Freq: Four times a day (QID) | INTRAMUSCULAR | Status: DC | PRN
  Administered 2019-09-04: 1 mg via INTRAMUSCULAR
  Filled 2019-09-04: qty 1

## 2019-09-04 MED ORDER — POTASSIUM CHLORIDE CRYS ER 20 MEQ PO TBCR: 40.0000 meq | EXTENDED_RELEASE_TABLET | Freq: Once | ORAL | Status: DC

## 2019-09-04 MED ORDER — POTASSIUM CHLORIDE 20 MEQ PO PACK
40.0000 meq | PACK | Freq: Once | ORAL | Status: AC
  Administered 2019-09-04: 40 meq via ORAL
  Filled 2019-09-04: qty 2

## 2019-09-04 NOTE — TOC Initial Note (Addendum)
Transition of Care [facility]) - Initial/Assessment Note    Patient Details  Name: Elizabeth [name] MRN: [id number] Date of Birth: Apr 22, 1927  [facility] CM/SW Contact:    Liliana Cline, LCSW Phone Number: 09/04/2019, 2:45 PM  Clinical Narrative:         Patient with advanced Dementia per chart review. CSW called patient's POA Floreen Comber to discuss PT's current recommendation for SNF at discharge. Lurena Joiner reported patient lives alone and has a caregiver who stays with her during the day, but is alone at night. Lurena Joiner is agreeable to SNF recommendation. Explained SNF options. Lurena Joiner reported she is only familiar with [facility] and would like them if possible, but would also like CSW to send patient's information to other local facilities as a back up. CSW completed PASRR Screening. CSW will do SNF workup.           Expected Discharge Plan: Skilled Nursing Facility Barriers to Discharge: Continued Medical Work up   Patient Goals and CMS Choice Patient states their goals for this hospitalization and ongoing recovery are:: SNF CMS Medicare.gov Compare Post Acute Care list provided to:: Patient Represenative (must comment) Choice offered to / list presented to : [facility] POA / Guardian  Expected Discharge Plan and Services Expected Discharge Plan: Skilled Nursing Facility       Living arrangements for the past 2 months: Single Family Home                                      Prior Living Arrangements/Services Living arrangements for the past 2 months: Single Family Home Lives with:: Self          Need for Family Participation in Patient Care: Yes (Comment) Care giver support system in place?: Yes (comment) Current home services: [facility] Criminal Activity/Legal Involvement Pertinent to Current Situation/Hospitalization: No - Comment as needed  Activities of Daily Living      Permission Sought/Granted Permission sought to share information with :  Facility Industrial/product designer granted to share information with : Yes, Verbal Permission Granted(permission granted by POA Floreen Comber. Patient with severe dementia per chart.)     Permission granted to share info w AGENCY: SNFs        Emotional Assessment       Orientation: : Fluctuating Orientation (Suspected and/or reported Sundowners) Alcohol / Substance Use: Not Applicable Psych Involvement: No (comment)  Admission diagnosis:  STEMI involving right coronary artery (HCC) [I21.11] STEMI (ST elevation myocardial infarction) [facility]) [I21.3] Patient Active Problem List   Diagnosis Date Noted  . STEMI involving right coronary artery (HCC) 09/03/2019  . STEMI (ST elevation myocardial infarction) (HCC) 09/02/2019   PCP:  Barbette Reichmann, MD Pharmacy:  No Pharmacies Listed    Social Determinants of Health (SDOH) Interventions    Readmission Risk Interventions No flowsheet data found.

## 2019-09-04 NOTE — Progress Notes (Signed)
Patient agitated attempting to get out of bed and pull at wires. Unable to redirect patient. Patient also swatting at staff and cursing Dr.Mansy contacted Haldol ordered and given.

## 2019-09-04 NOTE — [place] FL2 (Signed)
[place] MEDICAID FL2 LEVEL OF CARE SCREENING TOOL     IDENTIFICATION  Patient Name: Elizabeth [name] Birthdate: 1927/12/12 Sex: female Admission Date (Current Location): 09/02/2019  [place] and [place] Number:  Chiropodist and Address:  [facility] [address] [place], [place] [address]      Provider Number: [id number]  Attending Physician Name and Address:  Eddie North, MD  Relative Name and Phone Number:  Floreen [facility]) [phone]    Current Level of Care: Hospital Recommended Level of Care: Skilled Nursing Facility Prior Approval Number:    Date Approved/Denied:   PASRR Number: [id number]  Discharge Plan: Home    Current Diagnoses: Patient Active Problem List   Diagnosis Date Noted  . STEMI involving right coronary artery (HCC) 09/03/2019  . STEMI (ST elevation myocardial infarction) (HCC) 09/02/2019    Orientation RESPIRATION BLADDER Height & Weight     Self  Normal Incontinent, External catheter Weight: 114 lb 13.8 oz (52.1 kg) Height:  5\' 2"  (157.5 cm)  BEHAVIORAL SYMPTOMS/MOOD NEUROLOGICAL BOWEL NUTRITION STATUS      Incontinent Diet(Diet: Heart; Liquids: Thin)  AMBULATORY STATUS COMMUNICATION OF NEEDS Skin   Supervision(RW) Verbally Bruising                       Personal Care Assistance Level of Assistance  Bathing, Feeding, Dressing Bathing Assistance: Maximum assistance Feeding assistance: Limited assistance Dressing Assistance: Maximum assistance     Functional Limitations Info             SPECIAL CARE FACTORS FREQUENCY  PT (By licensed PT), OT (By licensed OT)     PT Frequency: 5x/week OT Frequency: 5x/week            Contractures      Additional Factors Info  Code Status, Allergies Code Status Info: Full Allergies Info: NKA           Current Medications (09/04/2019):  This is the current hospital active medication list Current Facility-Administered  Medications  Medication Dose Route Frequency Provider Last Rate Last Admin  . 0.9 %  sodium chloride infusion  250 mL Intravenous PRN End, 09/06/2019, MD      . acetaminophen (TYLENOL) tablet 650 mg  650 mg Oral Q4H PRN End, Cristal Deer, MD      . aspirin chewable tablet 81 mg  81 mg Oral Daily End, Christopher, MD   81 mg at 09/04/19 0948  . atorvastatin (LIPITOR) tablet 80 mg  80 mg Oral Daily End, Christopher, MD   80 mg at 09/04/19 0947  . Chlorhexidine Gluconate Cloth 2 % PADS 6 each  6 each Topical Daily End, Christopher, MD   6 each at 09/04/19 1105  . enoxaparin (LOVENOX) injection 40 mg  40 mg Subcutaneous Q24H End, Christopher, MD   40 mg at 09/04/19 0946  . haloperidol lactate (HALDOL) injection 1 mg  1 mg Intramuscular Q6H PRN Mansy, Jan A, MD   1 mg at 09/04/19 0030  . metoprolol tartrate (LOPRESSOR) 25 mg/10 mL oral suspension 12.5 mg  12.5 mg Oral BID Mansy, 09/06/19, MD   Stopped at 09/04/19 [phone]  . morphine 2 MG/ML injection 1 mg  1 mg Intravenous Q2H PRN Mansy, Jan A, MD      . nitroGLYCERIN (NITROSTAT) SL tablet 0.4 mg  0.4 mg Sublingual Q5 min PRN Mansy, Jan A, MD      . ondansetron [facility]) injection 4 mg  4 mg Intravenous Q6H  PRN End, Harrell Gave, MD      . sodium chloride flush (NS) 0.9 % injection 3 mL  3 mL Intravenous Q12H End, Christopher, MD   3 mL at 09/04/19 0956  . sodium chloride flush (NS) 0.9 % injection 3 mL  3 mL Intravenous PRN End, Harrell Gave, MD      . ticagrelor (BRILINTA) tablet 90 mg  90 mg Oral BID End, Christopher, MD   90 mg at 09/04/19 0948  . traZODone (DESYREL) tablet 25 mg  25 mg Oral QHS PRN Mansy, Jan A, MD   25 mg at 09/03/19 2121     Discharge Medications: Please see discharge summary for a list of discharge medications.  Relevant Imaging Results:  Relevant Lab Results:   Additional Information SS#: [id number]  [place], LCSW

## 2019-09-04 NOTE — Progress Notes (Signed)
Progress Note  Patient Name: Elizabeth [name] Date of Encounter: 09/04/2019  Primary Cardiologist: Nelva Bush, MD  Subjective   Pleasant, disoriented.  Was agitated earlier.  Denies c/p or dyspnea.  Inpatient Medications    Scheduled Meds: . aspirin  81 mg Oral Daily  . atorvastatin  80 mg Oral Daily  . Chlorhexidine Gluconate Cloth  6 each Topical Daily  . enoxaparin (LOVENOX) injection  40 mg Subcutaneous Q24H  . metoprolol tartrate  12.5 mg Oral BID  . potassium chloride  40 mEq Oral Once  . sodium chloride flush  3 mL Intravenous Q12H  . ticagrelor  90 mg Oral BID   Continuous Infusions: . sodium chloride     PRN Meds: sodium chloride, acetaminophen, haloperidol lactate, morphine injection, nitroGLYCERIN, ondansetron (ZOFRAN) IV, sodium chloride flush, traZODone   Vital Signs    Vitals:   09/04/19 0320 09/04/19 0420 09/04/19 0520 09/04/19 0620  BP: 118/72 135/60 (!) 135/51 (!) 123/52  Pulse: (!) 58 62 72 62  Resp: 20 19 (!) 23 20  Temp:      TempSrc:      SpO2: 96% 96% 96% 95%  Weight:      Height:        Intake/Output Summary (Last 24 hours) at 09/04/2019 0924 Last data filed at 09/04/2019 0000 Gross per 24 hour  Intake -  Output 400 ml  Net -400 ml   Filed Weights   09/02/19 2043 09/03/19 0110  Weight: 49 kg 52.1 kg    Physical Exam   GEN: Frail, in no acute distress.  HEENT: Grossly normal.  Neck: Supple, no JVD, carotid bruits, or masses. Cardiac: RRR, no murmurs, rubs, or gallops. No clubbing, cyanosis, edema.  Radials/DP/PT 2+ and equal bilaterally. L wrist cath site w/o bleeding/bruit/hematoma. Respiratory:  Respirations regular and unlabored, clear to auscultation bilaterally. GI: Soft, nontender, nondistended, BS + x 4. MS: no deformity or atrophy. Skin: warm and dry, no rash. Neuro:  Strength and sensation are intact. Psych: Disoriented to time/place.  Normal affect.  Labs    Chemistry Recent Labs  Lab 09/02/19 2049  09/03/19 0115 09/04/19 0534  NA 145 142 139  K 3.4* 3.4* 3.2*  CL 110 109 107  CO2 25 26 25   GLUCOSE 212* 186* 110*  BUN 33* 26* 15  CREATININE 0.73 0.63 0.50  CALCIUM 8.8* 8.4* 8.4*  GFRNONAA >60 >60 >60  GFRAA >60 >60 >60  ANIONGAP 10 7 7      Hematology Recent Labs  Lab 09/02/19 2049 09/03/19 0115 09/04/19 0534  WBC 11.5* 10.2 6.9  RBC 3.82* 3.89 3.97  HGB 11.3* 11.6* 11.6*  HCT 34.2* 34.8* 35.2*  MCV 89.5 89.5 88.7  MCH 29.6 29.8 29.2  MCHC 33.0 33.3 33.0  RDW 14.3 14.4 14.1  PLT 282 266 257    Cardiac Enzymes  Recent Labs  Lab 09/02/19 2049 09/03/19 0115 09/03/19 0910 09/03/19 1709  TROPONINIHS 148* 629* 4,112* 4,887*     Radiology    CT Head Wo Contrast  Result Date: 09/02/2019 CLINICAL DATA:  86 year old post unwitnessed fall. EXAM: CT HEAD WITHOUT CONTRAST TECHNIQUE: Contiguous axial images were obtained from the base of the skull through the vertex without intravenous contrast. COMPARISON:  Head CT 06/26/2017 FINDINGS: Brain: No intracranial hemorrhage, mass effect, or midline shift. Stable degree of atrophy and chronic small vessel ischemia. No hydrocephalus. The basilar cisterns are patent. Remote left cerebellar infarct is unchanged. No evidence of territorial infarct or acute ischemia. No extra-axial  or intracranial fluid collection. Vascular: Atherosclerosis of skullbase vasculature without hyperdense vessel or abnormal calcification. Skull: No fracture or focal lesion. Chronic bifrontal hyperostosis. Sinuses/Orbits: Paranasal sinuses and mastoid air cells are clear. The visualized orbits are unremarkable. Bilateral cataract resection. Other: None. IMPRESSION: 1. No acute intracranial abnormality. No skull fracture. 2. Stable atrophy and chronic small vessel ischemia. Remote left cerebellar infarct. Electronically Signed   By: Narda Rutherford M.D.   On: 09/02/2019 21:33   CT Cervical Spine Wo Contrast  Result Date: 09/02/2019 CLINICAL DATA:  Post  unwitnessed fall. EXAM: CT CERVICAL SPINE WITHOUT CONTRAST TECHNIQUE: Multidetector CT imaging of the cervical spine was performed without intravenous contrast. Multiplanar CT image reconstructions were also generated. COMPARISON:  None. FINDINGS: Alignment: Trace anterolisthesis of C7 on T1 is likely degenerative. No traumatic subluxation. Skull base and vertebrae: No acute fracture. Cervical vertebral body heights are preserved. Minor superior endplate compression deformities of T1 and T2. the dens and skull base are intact. No evidence of focal lesion. Soft tissues and spinal canal: No prevertebral fluid or swelling. No visible canal hematoma. Disc levels: Diffuse multilevel degenerative disc disease with disc space narrowing and endplate spurring. There is prominent facet hypertrophy throughout. Upper chest: Minor septal thickening at the apices and may represent pulmonary edema. Other: None. IMPRESSION: 1. No acute fracture or traumatic subluxation of the cervical spine. 2. Multilevel degenerative disc disease and facet hypertrophy. 3. Minor chronic appearing superior endplate compression deformities of T1 and T2. 4. Possible pulmonary edema at the lung apices. Electronically Signed   By: Narda Rutherford M.D.   On: 09/02/2019 21:37   MR BRAIN WO CONTRAST  Result Date: 09/03/2019 CLINICAL DATA:  Neuro deficit, acute, stroke suspected. EXAM: MRI HEAD WITHOUT CONTRAST TECHNIQUE: Multiplanar, multiecho pulse sequences of the brain and surrounding structures were obtained without intravenous contrast. COMPARISON:  Head CT Sep 02, 2019 FINDINGS: The study is significantly degraded by motion. Brain: No acute infarction, hemorrhage, hydrocephalus, extra-axial collection or mass lesion. Small remote infarcts are seen in the bilateral cerebellar hemispheres and right centrum semiovale. Scattered and confluent foci of T2 hyperintensity are seen within the white matter of the cerebral hemispheres, basal ganglia,  thalami and pons, nonspecific, most likely related to chronic small vessel ischemia. Prominence of the cerebral and cerebellar sulci and supratentorial ventricles reflecting parenchymal volume loss. Vascular: Normal flow voids. Ectatic supraclinoid right ICA noted. Skull and upper cervical spine: Normal marrow signal. Sinuses/Orbits: Bilateral lens surgery. Paranasal sinuses are essentially clear. IMPRESSION: 1. No acute intracranial abnormality. 2. Small remote infarcts in the bilateral cerebellar hemispheres and right centrum semiovale. 3. Advanced chronic small vessel ischemia and moderate parenchymal volume loss. Electronically Signed   By: Baldemar Lenis M.D.   On: 09/03/2019 14:24   Telemetry    RSR, sinus brady - Personally Reviewed  Cardiac Studies   Cardiac Catheterization and Percutaneous Coronary Intervention 5.25.2021            Left Anterior Descending  Vessel is moderate in size.  Mid LAD lesion 30% stenosed  Mid LAD lesion is 30% stenosed.  Mid LAD to Dist LAD lesion 20% stenosed  Mid LAD to Dist LAD lesion is 20% stenosed.  First Diagonal Branch          Vessel is small in size.          Second Diagonal Branch         Vessel is small in size.  Third Diagonal Branch          Vessel is large in size.          Left Circumflex   Vessel is large.   Prox Cx lesion 30% stenosed   Prox Cx lesion is 30% stenosed.   Mid Cx to Dist Cx lesion 30% stenosed   Mid Cx to Dist Cx lesion is 30% stenosed.   First Obtuse Marginal Branch        Vessel is small in size.        Second Obtuse Marginal Branch      Vessel is moderate in size.      Third Obtuse Marginal Branch       Vessel is small in size.       Right Coronary Artery  Vessel is large.  Prox RCA lesion 70% stenosed  Prox RCA lesion is 70% stenosed. The lesion is thrombotic.  Mid RCA lesion 90% stenosed  Mid RCA lesion is 90% stenosed. The lesion is thrombotic.  Dist RCA lesion 70% stenosed  Dist  RCA lesion is 70% stenosed. The lesion is thrombotic. **overlapping synergy 4.0 x 12 mm (proximal) 4.0 x 32 mm (mid) and 4.0 x 38 mm (distal) drug-eluting stents   Right Posterior Descending Artery     Vessel is moderate in size.     Right Posterior Atrioventricular Artery    Vessel is large in size.     Ventricle The left ventricular systolic function is normal. LV end diastolic pressure is mildly elevated. LVEDP 15-20 mmHg. The left ventricular ejection fraction is greater than 65% by visual estimate.  _____________  2D Echocardiogram 5.26.2021   1. Left ventricular ejection fraction, by estimation, is 65 to 70%. The  left ventricle has normal function. The left ventricle has no regional  wall motion abnormalities. Left ventricular diastolic parameters were  normal.   2. Right ventricular systolic function is normal. The right ventricular  size is normal.   3. The mitral valve is normal in structure. Trivial mitral valve  regurgitation. No evidence of mitral stenosis.   4. The aortic valve is normal in structure. Aortic valve regurgitation is  not visualized. No aortic stenosis is present.   Patient Profile     86 y.o. female w/ a h/o dementia, who was admitted 5/25 following presentation with syncope and inferiolateral STEMI. Now s/p PCI/DES x 3 to the RCA.  Assessment & Plan    1.  Inferolateral STEMI/CAD:  Pt presented following syncopal episode w/ report of right sided wkns. Head CT w/o acute finding.  ECG w/ inflat ST elev and HsTrop initially 148  4000.  Cath w/ sev prox/mid/distal RCA dzs, which was treated w/ DES x 3. No c/p or dyspnea.  Echo w/ nl EF. Cath site clear.  Cont asa, statin,  blocker, brilinta.   2.  Right sided wkns:  Reported by POA on arrival.  Head CT w/o acute findings.  MRI: 1. No acute intracranial abnormality. 2. Small remote infarcts in the bilateral cerebellar hemispheres and right centrum semiovale. 3. Advanced chronic small vessel ischemia and  moderate parenchymal volume loss.  Cont asa/statin.  3.  HL:  LD 89.  Cont high potency statin rx.  4.  Hypokalemia:  supp.  Signed, Nicolasa Ducking, NP  09/04/2019, 9:24 AM    For questions or updates, please contact   Please consult [email or web address] for contact info under Cardiology/STEMI.

## 2019-09-04 NOTE — Progress Notes (Signed)
CH visited pt. while rounding on ICU; pt. waved to [facility] as he passed by; Pt. lying in bed with pleasant countenance, in good spirits.  Pt.'s speech difficult to understand at times, and her conversation suggested she was not fully aware of her surroundings.  CH visited w/pt. briefly; CH will try to return tomorrow if possible.    09/04/19 1530  Clinical Encounter Type  Visited With Patient  Visit Type Initial;Social support;Psychological support  Referral From  (Routine Rounding)

## 2019-09-04 NOTE — Evaluation (Signed)
Physical Therapy Evaluation Patient Details Name: Elizabeth [name] MRN: [id number] DOB: 1927/11/10 Today's Date: 09/04/2019   History of Present Illness  Elizabeth [name] is a 45yoF who [facility] c AMS, decreased responsiveness, syncope. PMH: dementia.  Clinical Impression  Pt admitted with above diagnosis. Pt currently with functional limitations due to the deficits listed below (see "PT Problem List"). Per chart review, last labs reveal up-trending of troponin, however MD gives clearance for PT to work with patient at this time. Upon entry, pt in bed, awake and agreeable to participate. Pt is attempting to feed herself precut and pre-syrup'd pancakes, but has lots of food on her gown, unclear of how successful she has been. The pt is alert and interactive, delayed response, disoriented in time, hence her responses are really not of this time and place. Pt follows simple repeated cues for basic mobility, but is easily distracted, easily redirected. Physical assist is needed for transfers and needed for safety during short distance AMB in room with a RW. Upon coming to standing, bowel and urine stains are revealed in bed and on patient- RN is made aware of this. AMB is limited for safety and incontinence reasons, although pt lkely would have tolerated up to 36ft with hand held assistance. Functional mobility assessment demonstrates increased effort/time requirements, poor tolerance, and need for physical assistance, whereas the patient performed these at a higher level of independence PTA. Pt somnolent at end of session, chair reclined, pt pillowed, and chair alarm in place per RN request. Pt will benefit from skilled PT intervention to increase independence and safety with basic mobility in preparation for discharge to the venue listed below.       Follow Up Recommendations SNF;Supervision/Assistance - 24 hour;Supervision for mobility/OOB    Equipment Recommendations  Rolling  walker with 5" wheels    Recommendations for Other Services       Precautions / Restrictions Precautions Precautions: Fall Precaution Comments: agitated, disoriented. Restrictions Weight Bearing Restrictions: No      Mobility  Bed Mobility Overal bed mobility: Needs Assistance Bed Mobility: Supine to Sit     Supine to sit: Min assist     General bed mobility comments: difficulty attending to task, following commands, distractible, labored.  Transfers Overall transfer level: Needs assistance Equipment used: Rolling walker (2 wheeled) Transfers: Sit to/from Stand Sit to Stand: Min assist;From elevated surface         General transfer comment: difficulty establishing balance, utilizing RW  Ambulation/Gait Ambulation/Gait assistance: Min guard Gait Distance (Feet): 7 Feet Assistive device: Rolling walker (2 wheeled)       General Gait Details: slow, requires safety cues, mildly unsteady; unsafe to attempt further, pt has soiled undergarments donned.  Stairs            Wheelchair Mobility    Modified Rankin (Stroke Patients Only)       Balance Overall balance assessment: Needs assistance Sitting-balance support: Feet unsupported;Single extremity supported Sitting balance-Leahy Scale: Good     Standing balance support: Bilateral upper extremity supported;During functional activity Standing balance-Leahy Scale: Poor                               Pertinent Vitals/Pain Pain Assessment: No/denies pain    Home Living Family/patient expects to be discharged to:: Unsure                 Additional Comments: Pt disoriented, unable to provide  meaningful and correct background information.    Prior Function                 Hand Dominance        Extremity/Trunk Assessment   Upper Extremity Assessment Upper Extremity Assessment: Generalized weakness    Lower Extremity Assessment Lower Extremity Assessment: Generalized  weakness       Communication      Cognition Arousal/Alertness: Awake/alert Behavior During Therapy: (distactible.) Overall Cognitive Status: No family/caregiver present to determine baseline cognitive functioning Area of Impairment: Orientation;Attention;Memory;Following commands;Safety/judgement;Awareness;Problem solving                 Orientation Level: Disoriented to;Place;Time;Situation Current Attention Level: Alternating;Divided Memory: Decreased recall of precautions;Decreased short-term memory Following Commands: Follows one step commands inconsistently;Follows one step commands with increased time Safety/Judgement: Decreased awareness of safety;Decreased awareness of deficits Awareness: Anticipatory Problem Solving: Slow processing;Decreased initiation;Difficulty sequencing;Requires verbal cues        General Comments      Exercises     Assessment/Plan    PT Assessment Patient needs continued PT services  PT Problem List Decreased strength;Decreased activity tolerance;Decreased balance;Decreased mobility;Decreased cognition;Decreased knowledge of use of DME;Decreased safety awareness;Decreased knowledge of precautions       PT Treatment Interventions DME instruction;Balance training;Gait training;Stair training;Functional mobility training;Therapeutic activities;Therapeutic exercise;Patient/family education;Cognitive remediation    PT Goals (Current goals can be found in the Care Plan section)  Acute Rehab PT Goals PT Goal Formulation: Patient unable to participate in goal setting    Frequency Min 2X/week   Barriers to discharge Inaccessible home environment;Decreased caregiver support      Co-evaluation               AM-PAC PT "6 Clicks" Mobility  Outcome Measure Help needed turning from your back to your side while in a flat bed without using bedrails?: A Little Help needed moving from lying on your back to sitting on the side of a flat bed  without using bedrails?: A Little Help needed moving to and from a bed to a chair (including a wheelchair)?: A Little Help needed standing up from a chair using your arms (e.g., wheelchair or bedside chair)?: A Little Help needed to walk in hospital room?: A Little Help needed climbing 3-5 steps with a railing? : A Lot 6 Click Score: 17    End of Session Equipment Utilized During Treatment: Gait belt Activity Tolerance: Patient tolerated treatment well;Patient limited by fatigue;Other (comment)(cognition and need for cleanup) Patient left: in chair;with chair alarm set Nurse Communication: Mobility status PT Visit Diagnosis: Unsteadiness on feet (R26.81);Difficulty in walking, not elsewhere classified (R26.2);Other abnormalities of gait and mobility (R26.89);Muscle weakness (generalized) (M62.81)    Time: 1009-1040 PT Time Calculation (min) (ACUTE ONLY): 31 min   Charges:   PT Evaluation $PT Eval Moderate Complexity: 1 Mod         11:41 AM, 09/04/19 Rosamaria Lints, PT, DPT Physical Therapist - [facility]  [phone] (ASCOM)    [place] Marchiano C 09/04/2019, 11:36 AM

## 2019-09-04 NOTE — Progress Notes (Addendum)
PROGRESS NOTE    Elizabeth [name]  ZOX:[id number] DOB: 1928-02-19 DOA: 09/02/2019 PCP: Barbette Reichmann, MD   Chief Complaint  Patient presents with  . Fall  . Altered Mental Status    Brief Narrative:  86 year old female with history of severe dementia presented with altered mental status, syncope and poor responsiveness.  Patient was seen on her driveway on the evening of admission and then found shortly by her neighbor lying on the ground for unknown duration.  Her caregiver and sister-in-law had noticed that she had some right-sided weakness for the past 1 week. In the ED patient was found to have potassium of 3.4 and markedly elevated high sensitive troponin of 629.  EKG showed acute inferior ST elevation infarct. Patient started on aspirin, high-dose statin, Kengreal and Brilinta and taken to Cath Lab urgently and had 3 drug-eluting stent to the RCA.  Tolerated procedure well.    Assessment & Plan:   Principal problem Inferior STEMI S/p PCI x3 to RCA.  Cardiology recommends dual antiplatelet therapy for 12 months.  Currently on aspirin and Brilinta for now and possibly switch Brilinta to Plavix after 1 month. Continue Lipitor and Lopressor. 2D echo with normal EF.  Active problems Right-sided weakness Unclear duration.  MRI brain shows no acute finding with chronic small vessel ischemia and a remote bilateral cerebellar infarct.  on aspirin and statin.  PT recommends SNF.  Dementia, moderate to severe Seems to have worsened during hospitalization, frequently trying to get out of bed.  Essential hypertension Continue current meds  Transfer patient to telemetry.  DVT prophylaxis: Subcu Lovenox Code Status: Full code Family Communication: None at bedside.  Will update her daughter-in-law. Disposition:   Status is: Inpatient  Remains inpatient appropriate because:Unsafe d/c plan.  Patient lives home alone and given her medical condition, generalized weakness and  severe dementia she is unsafe to be discharged home alone at this time.   Dispo: The patient is from: Home              Anticipated d/c is to: SNF              Anticipated d/c date is: 1 day              Patient currently is not medically stable to d/c.        Consultants:   Cardiology   Procedures:  Cardiac cath, 2D echo, MRI brain  Antimicrobials:  Subjective: Seen and examined.  Denies any chest pain or discomfort.  No she is in the hospital but cannot describe further.  Objective: Vitals:   09/04/19 0955 09/04/19 1000 09/04/19 1020 09/04/19 1100  BP: (!) 110/92  (!) 120/109   Pulse: 60 (!) 56 84 67  Resp:  Temp:      TempSrc:      SpO2:  96% 100% 99%  Weight:      Height:        Intake/Output Summary (Last 24 hours) at 09/04/2019 1156 Last data filed at 09/04/2019 8119 Gross per 24 hour  Intake 3 ml  Output 600 ml  Net -597 ml   Filed Weights   09/02/19 2043 09/03/19 0110  Weight: 49 kg 52.1 kg   Physical exam Not in distress, confused HEENT: Moist mucosa, supple neck Chest: Clear CVs: Normal S1-S2 GI: Soft, nontender, nondistended Musculoskeletal: Warm, no edema       Data Reviewed: I have personally reviewed following labs and imaging studies  CBC: Recent Labs  Lab 09/02/19 2049 09/03/19 0115 09/04/19 0534  WBC 11.5* 10.2 6.9  HGB 11.3* 11.6* 11.6*  HCT 34.2* 34.8* 35.2*  MCV 89.5 89.5 88.7  PLT 282 266 257    Basic Metabolic Panel: Recent Labs  Lab 09/02/19 2049 09/03/19 0115 09/03/19 0919 09/04/19 0534  NA 145 142  --  139  K 3.4* 3.4*  --  3.2*  CL 110 109  --  107  CO2 25 26  --  25  GLUCOSE 212* 186*  --  110*  BUN 33* 26*  --  15  CREATININE 0.73 0.63  --  0.50  CALCIUM 8.8* 8.4*  --  8.4*  MG  --   --  1.9 1.8    GFR: Estimated Creatinine Clearance: 37 mL/min (by C-G formula based on SCr of 0.5 mg/dL).  Liver Function Tests: No results for input(s): AST, ALT, ALKPHOS, BILITOT, PROT, ALBUMIN in  the last 168 hours.  CBG: Recent Labs  Lab 09/03/19 0059  GLUCAP 155*     Recent Results (from the past 240 hour(s))  SARS Coronavirus 2 by RT PCR (hospital order, performed in [facility] hospital lab) Nasopharyngeal Nasopharyngeal Swab     Status: None   Collection Time: 09/02/19 10:12 PM   Specimen: Nasopharyngeal Swab  Result Value Ref Range Status   SARS Coronavirus 2 NEGATIVE NEGATIVE Final    Comment: (NOTE) SARS-CoV-2 target nucleic acids are NOT DETECTED. The SARS-CoV-2 RNA is generally detectable in upper and lower respiratory specimens during the acute phase of infection. The lowest concentration of SARS-CoV-2 viral copies this assay can detect is 250 copies / mL. A negative result does not preclude SARS-CoV-2 infection and should not be used as the sole basis for treatment or other patient management decisions.  A negative result may occur with improper specimen collection / handling, submission of specimen other than nasopharyngeal swab, presence of viral mutation(s) within the areas targeted by this assay, and inadequate number of viral copies (<250 copies / mL). A negative result must be combined with clinical observations, patient history, and epidemiological information. Fact Sheet for Patients:   BoilerBrush.com.cy Fact Sheet for Healthcare Providers: https://pope.com/ This test is not yet approved or cleared  by the [place] FDA and has been authorized for detection and/or diagnosis of SARS-CoV-2 by FDA under an Emergency Use Authorization (EUA).  This EUA will remain in effect (meaning this test can be used) for the duration of the COVID-19 declaration under Section 564(b)(1) of the Act, 21 U.S.C. section 360bbb-3(b)(1), unless the authorization is terminated or revoked sooner. Performed at [facility], [address] [place], [place] [address]   MRSA PCR Screening     Status: None    Collection Time: 09/03/19  1:02 AM   Specimen: Nasopharyngeal  Result Value Ref Range Status   MRSA by PCR NEGATIVE NEGATIVE Final    Comment:        The GeneXpert MRSA Assay (FDA approved for NASAL specimens only), is one component of a comprehensive MRSA colonization surveillance program. It is not intended to diagnose MRSA infection nor to guide or monitor treatment for MRSA infections. Performed at [facility], [address] [place], [place] [address]          Radiology Studies: CT Head Wo Contrast  Result Date: 09/02/2019 CLINICAL DATA:  86 year old post unwitnessed fall. EXAM: CT HEAD WITHOUT CONTRAST TECHNIQUE: Contiguous axial images were obtained from the base of the skull through the vertex without intravenous contrast.  COMPARISON:  Head CT 06/26/2017 FINDINGS: Brain: No intracranial hemorrhage, mass effect, or midline shift. Stable degree of atrophy and chronic small vessel ischemia. No hydrocephalus. The basilar cisterns are patent. Remote left cerebellar infarct is unchanged. No evidence of territorial infarct or acute ischemia. No extra-axial or intracranial fluid collection. Vascular: Atherosclerosis of skullbase vasculature without hyperdense vessel or abnormal calcification. Skull: No fracture or focal lesion. Chronic bifrontal hyperostosis. Sinuses/Orbits: Paranasal sinuses and mastoid air cells are clear. The visualized orbits are unremarkable. Bilateral cataract resection. Other: None. IMPRESSION: 1. No acute intracranial abnormality. No skull fracture. 2. Stable atrophy and chronic small vessel ischemia. Remote left cerebellar infarct. Electronically Signed   By: Narda Rutherford M.D.   On: 09/02/2019 21:33   CT Cervical Spine Wo Contrast  Result Date: 09/02/2019 CLINICAL DATA:  Post unwitnessed fall. EXAM: CT CERVICAL SPINE WITHOUT CONTRAST TECHNIQUE: Multidetector CT imaging of the cervical spine was performed without intravenous contrast. Multiplanar  CT image reconstructions were also generated. COMPARISON:  None. FINDINGS: Alignment: Trace anterolisthesis of C7 on T1 is likely degenerative. No traumatic subluxation. Skull base and vertebrae: No acute fracture. Cervical vertebral body heights are preserved. Minor superior endplate compression deformities of T1 and T2. the dens and skull base are intact. No evidence of focal lesion. Soft tissues and spinal canal: No prevertebral fluid or swelling. No visible canal hematoma. Disc levels: Diffuse multilevel degenerative disc disease with disc space narrowing and endplate spurring. There is prominent facet hypertrophy throughout. Upper chest: Minor septal thickening at the apices and may represent pulmonary edema. Other: None. IMPRESSION: 1. No acute fracture or traumatic subluxation of the cervical spine. 2. Multilevel degenerative disc disease and facet hypertrophy. 3. Minor chronic appearing superior endplate compression deformities of T1 and T2. 4. Possible pulmonary edema at the lung apices. Electronically Signed   By: Narda Rutherford M.D.   On: 09/02/2019 21:37   MR BRAIN WO CONTRAST  Result Date: 09/03/2019 CLINICAL DATA:  Neuro deficit, acute, stroke suspected. EXAM: MRI HEAD WITHOUT CONTRAST TECHNIQUE: Multiplanar, multiecho pulse sequences of the brain and surrounding structures were obtained without intravenous contrast. COMPARISON:  Head CT Sep 02, 2019 FINDINGS: The study is significantly degraded by motion. Brain: No acute infarction, hemorrhage, hydrocephalus, extra-axial collection or mass lesion. Small remote infarcts are seen in the bilateral cerebellar hemispheres and right centrum semiovale. Scattered and confluent foci of T2 hyperintensity are seen within the white matter of the cerebral hemispheres, basal ganglia, thalami and pons, nonspecific, most likely related to chronic small vessel ischemia. Prominence of the cerebral and cerebellar sulci and supratentorial ventricles reflecting  parenchymal volume loss. Vascular: Normal flow voids. Ectatic supraclinoid right ICA noted. Skull and upper cervical spine: Normal marrow signal. Sinuses/Orbits: Bilateral lens surgery. Paranasal sinuses are essentially clear. IMPRESSION: 1. No acute intracranial abnormality. 2. Small remote infarcts in the bilateral cerebellar hemispheres and right centrum semiovale. 3. Advanced chronic small vessel ischemia and moderate parenchymal volume loss. Electronically Signed   By: Baldemar Lenis M.D.   On: 09/03/2019 14:24   CARDIAC CATHETERIZATION  Result Date: 09/03/2019 Conclusions: 1. Severe single-vessel coronary artery disease with multifocal areas of severe stenosis involving the RCA.  This likely represents extensive plaquing with acute plaque rupture and thrombus formation consistent with type I MI. 2. Mild, nonobstructive coronary artery disease involving the LAD and LCx. 3. Grossly normal left ventricular systolic function with mildly elevated filling pressure. 4. Successful PCI to the proximal through distal RCA using overlapping synergy 4.0 x 12  mm (proximal) 4.0 x 32 mm (mid) and 4.0 x 38 mm (distal) drug-eluting stents with 0% residual stenosis and TIMI-3 flow. Recommendations: 1. Admit to ICU for post STEMI care/monitoring. 2. Continue cangrelor for 1 hour post PCI. 3. Dual antiplatelet therapy for at least 12 months.  I suggest continuing aspirin and ticagrelor for one month, at which time we could consider switching ticagrelor to clopidogrel to lessen the risk for bleeding. 4. Aggressive secondary prevention, including high intensity statin therapy. Nelva Bush, MD [facility]   ECHOCARDIOGRAM COMPLETE  Result Date: 09/03/2019    ECHOCARDIOGRAM REPORT   Patient Name:   DESTINEY SANABIA Date of Exam: 09/03/2019 Medical Rec #:  [id number]             Height:       62.0 in Accession #:    [id number]            Weight:       114.9 lb Date of Birth:  May 01, 1927              BSA:           1.510 m Patient Age:    21 years              BP:           101/52 mmHg Patient Gender: F                     HR:           69 bpm. Exam Location:  [facility] Procedure: 2D Echo, Color Doppler and Cardiac Doppler Indications:     Acute myocardial infarction 410  History:         Patient has no prior history of Echocardiogram examinations.                  Dementia.  Sonographer:     Charmayne Sheer RDCS (AE) Referring Phys:  [phone] CHRISTOPHER END Diagnosing Phys: Kate Sable MD  Sonographer Comments: Suboptimal apical window. Image acquisition challenging due to breast implants. IMPRESSIONS  1. Left ventricular ejection fraction, by estimation, is 65 to 70%. The left ventricle has normal function. The left ventricle has no regional wall motion abnormalities. Left ventricular diastolic parameters were normal.  2. Right ventricular systolic function is normal. The right ventricular size is normal.  3. The mitral valve is normal in structure. Trivial mitral valve regurgitation. No evidence of mitral stenosis.  4. The aortic valve is normal in structure. Aortic valve regurgitation is not visualized. No aortic stenosis is present. FINDINGS  Left Ventricle: Left ventricular ejection fraction, by estimation, is 65 to 70%. The left ventricle has normal function. The left ventricle has no regional wall motion abnormalities. The left ventricular internal cavity size was normal in size. There is  no left ventricular hypertrophy. Left ventricular diastolic parameters were normal. Right Ventricle: The right ventricular size is normal. No increase in right ventricular wall thickness. Right ventricular systolic function is normal. Left Atrium: Left atrial size was normal in size. Right Atrium: Right atrial size was normal in size. Pericardium: There is no evidence of pericardial effusion. Mitral Valve: The mitral valve is normal in structure. Normal mobility of the mitral valve leaflets. Trivial mitral valve regurgitation. No  evidence of mitral valve stenosis. MV peak gradient, 3.0 mmHg. The mean mitral valve gradient is 1.0 mmHg. Tricuspid Valve: The tricuspid valve is normal in structure. Tricuspid valve regurgitation is not demonstrated. No evidence of  tricuspid stenosis. Aortic Valve: The aortic valve is normal in structure. Aortic valve regurgitation is not visualized. No aortic stenosis is present. Aortic valve mean gradient measures 5.0 mmHg. Aortic valve peak gradient measures 9.1 mmHg. Aortic valve area, by VTI measures 1.65 cm. Pulmonic Valve: The pulmonic valve was normal in structure. Pulmonic valve regurgitation is not visualized. No evidence of pulmonic stenosis. Aorta: The aortic root is normal in size and structure. Venous: The inferior vena cava was not well visualized. IAS/Shunts: No atrial level shunt detected by color flow Doppler.  LEFT VENTRICLE PLAX 2D LVIDd:         3.04 cm  Diastology LVIDs:         1.82 cm  LV e' lateral:   8.70 cm/s LV PW:         0.68 cm  LV E/e' lateral: 11.3 LV IVS:        0.88 cm  LV e' medial:    9.57 cm/s LVOT diam:     1.70 cm  LV E/e' medial:  10.3 LV SV:         54 LV SV Index:   36 LVOT Area:     2.27 cm  LEFT ATRIUM           Index LA diam:      3.10 cm 2.05 cm/m LA Vol (A4C): 52.3 ml 34.63 ml/m  AORTIC VALVE                    PULMONIC VALVE AV Area (Vmax):    1.71 cm     PV Vmax:       0.81 m/s AV Area (Vmean):   1.62 cm     PV Vmean:      52.000 cm/s AV Area (VTI):     1.65 cm     PV VTI:        0.143 m AV Vmax:           151.00 cm/s  PV Peak grad:  2.6 mmHg AV Vmean:          110.000 cm/s PV Mean grad:  1.0 mmHg AV VTI:            0.326 m AV Peak Grad:      9.1 mmHg AV Mean Grad:      5.0 mmHg LVOT Vmax:         114.00 cm/s LVOT Vmean:        78.300 cm/s LVOT VTI:          0.237 m LVOT/AV VTI ratio: 0.73  AORTA Ao Root diam: 3.10 cm MITRAL VALVE               TRICUSPID VALVE MV Area (PHT): 3.19 cm    TR Peak grad:   23.4 mmHg MV Peak grad:  3.0 mmHg    TR Vmax:         242.00 cm/s MV Mean grad:  1.0 mmHg MV Vmax:       0.86 m/s    SHUNTS MV Vmean:      54.2 cm/s   Systemic VTI:  0.24 m MV Decel Time: 238 msec    Systemic Diam: 1.70 cm MV E velocity: 98.10 cm/s MV A velocity: 62.10 cm/s MV E/A ratio:  1.58 Debbe OdeaBrian Agbor-Etang MD Electronically signed by Debbe OdeaBrian Agbor-Etang MD Signature Date/Time: 09/03/2019/2:25:55 PM    Final         Scheduled Meds: . aspirin  81 mg Oral  Daily  . atorvastatin  80 mg Oral Daily  . Chlorhexidine Gluconate Cloth  6 each Topical Daily  . enoxaparin (LOVENOX) injection  40 mg Subcutaneous Q24H  . metoprolol tartrate  12.5 mg Oral BID  . sodium chloride flush  3 mL Intravenous Q12H  . ticagrelor  90 mg Oral BID   Continuous Infusions: . sodium chloride       LOS: 1 day    Time spent: 25 minutes    Dezzie Badilla, MD [facility]   To contact the attending provider between 7A-7P or the covering provider during after hours 7P-7A, please log into the web site [email or web address] and access using universal [place] password for that web site. If you do not have the password, please call the hospital operator.  09/04/2019, 11:56 AM

## 2019-09-04 NOTE — Progress Notes (Signed)
End of Shift Summary:    Patient alert to self only this shift.  Poor attention, concentration and safety awareness.  Able to feed self once set up.  Incontinent of bowel and bladder.  Continues to pull EKG leads, blood pressure cuff, pulse ox, purewick, and gown off frequently. Bed alarm remains on.  Therapy worked with patient this day.  Left radial site remains a level 1 with some bruising.  Vitals sign stable. No complaints of pain. No aggressive behavior or cursing at staff as reported to this RN from nightshift RN.  Patient did nap briefly this afternoon after therapy worked with her.

## 2019-09-05 DIAGNOSIS — R410 Disorientation, unspecified: Secondary | ICD-10-CM

## 2019-09-05 LAB — BASIC METABOLIC PANEL
Anion gap: 9 (ref 5–15)
BUN: 14 mg/dL (ref 8–23)
CO2: 26 mmol/L (ref 22–32)
Calcium: 8.3 mg/dL — ABNORMAL LOW (ref 8.9–10.3)
Chloride: 104 mmol/L (ref 98–111)
Creatinine, Ser: 0.56 mg/dL (ref 0.44–1.00)
GFR calc Af Amer: >60 mL/min (ref >60)
GFR calc non Af Amer: >60 mL/min (ref >60)
Glucose, Bld: 113 mg/dL — ABNORMAL HIGH (ref 70–99)
Potassium: 3.2 mmol/L — ABNORMAL LOW (ref 3.5–5.1)
Sodium: 139 mmol/L (ref 135–145)

## 2019-09-05 LAB — TROPONIN I (HIGH SENSITIVITY): Troponin I (High Sensitivity): 1491 ng/L (ref <18)

## 2019-09-05 MED ORDER — POTASSIUM CHLORIDE CRYS ER 20 MEQ PO TBCR: 40.0000 meq | EXTENDED_RELEASE_TABLET | Freq: Once | ORAL | Status: DC

## 2019-09-05 MED ORDER — POTASSIUM CHLORIDE 20 MEQ PO PACK
40.0000 meq | PACK | Freq: Four times a day (QID) | ORAL | Status: AC
  Administered 2019-09-05 (×2): 40 meq via ORAL
  Filled 2019-09-05 (×2): qty 2

## 2019-09-05 MED ORDER — MAGNESIUM SULFATE 2 GM/50ML IV SOLN
2.0000 g | Freq: Once | INTRAVENOUS | Status: AC
  Administered 2019-09-05: 2 g via INTRAVENOUS
  Filled 2019-09-05: qty 50

## 2019-09-05 NOTE — Progress Notes (Signed)
PROGRESS NOTE    Elizabeth [name]  CBU:[id number] DOB: [birth year] DOA: 09/02/2019 PCP: Barbette Reichmann, MD   Chief Complaint  Patient presents with  . Fall  . Altered Mental Status    Brief Narrative:  86 year old female with history of severe dementia presented with altered mental status, syncope and poor responsiveness.  Patient was seen on her driveway on the evening of admission and then found shortly by her neighbor lying on the ground for unknown duration.  Her caregiver and sister-in-law had noticed that she had some right-sided weakness for the past 1 week. In the ED patient was found to have potassium of 3.4 and markedly elevated high sensitive troponin of 629.  EKG showed acute inferior ST elevation infarct. Patient started on aspirin, high-dose statin, Kengreal and Brilinta and taken to Cath Lab urgently and had 3 drug-eluting stent to the RCA.  Tolerated procedure well.    Assessment & Plan:   Principal problem Inferior STEMI S/p PCI x3 to RCA.  Cardiology recommends dual antiplatelet therapy for 12 months.  Currently on aspirin and Brilinta for now and possibly switch Brilinta to Plavix after 1 month. Continue Lipitor and Lopressor. 2D echo with normal EF.  Active problems Right-sided weakness Unclear duration.  MRI brain shows no acute finding with chronic small vessel ischemia and a remote bilateral cerebellar infarct.  on aspirin and statin.  PT recommends SNF.  Dementia, moderate to severe Seems to have worsened during hospitalization, frequently trying to get out of bed.  Essential hypertension Continue current meds  Hypokalemia Replenished   DVT prophylaxis: Subcu Lovenox Code Status: Full code Family Communication: Updated daughter-in-law. Disposition:   Status is: Inpatient  Remains inpatient appropriate because:Unsafe d/c plan.  Patient lives home alone and given her medical condition, generalized weakness and severe dementia she is unsafe  to be discharged home alone at this time.  Daughter-in-law is planning to arrange a caregiver at home in future.   Dispo: The patient is from: Home              Anticipated d/c is to: SNF              Anticipated d/c date is: 1 day              Patient currently is not medically stable to d/c.        Consultants:   Cardiology   Procedures:  Cardiac cath, 2D echo, MRI brain  Antimicrobials:  Subjective: Seen and examined.  Denies any pain.  Remains confused.  Objective: Vitals:   09/05/19 1033 09/05/19 1100 09/05/19 1130 09/05/19 1415  BP: (!) 105/55  116/77 (!) 128/105  Pulse: (!) 55 (!) 54 61 74  Resp: 14 14 16 16   Temp:      TempSrc:      SpO2: 97% 96% 97% 97%  Weight:      Height:        Intake/Output Summary (Last 24 hours) at 09/05/2019 1455 Last data filed at 09/05/2019 09/07/2019 Gross per 24 hour  Intake 249 ml  Output 100 ml  Net 149 ml   Filed Weights   09/02/19 2043 09/03/19 0110  Weight: 49 kg 52.1 kg   Physical exam Not in distress, remains confused HEENT: Moist mucosa Chest: Clear CVs: Normal S1-S2 GI: Soft, nondistended, nontender Musculoskeletal: Warm, no edema        Data Reviewed: I have personally reviewed following labs and imaging studies  CBC: Recent Labs  Lab 09/02/19 2049 09/03/19  0115 09/04/19 0534  WBC 11.5* 10.2 6.9  HGB 11.3* 11.6* 11.6*  HCT 34.2* 34.8* 35.2*  MCV 89.5 89.5 88.7  PLT 282 266 536    Basic Metabolic Panel: Recent Labs  Lab 09/02/19 2049 09/03/19 0115 09/03/19 0919 09/04/19 0534 09/05/19 0526  NA 145 142  --  139 139  K 3.4* 3.4*  --  3.2* 3.2*  CL 110 109  --  107 104  CO2 25 26  --  25 26  GLUCOSE 212* 186*  --  110* 113*  BUN 33* 26*  --  15 14  CREATININE 0.73 0.63  --  0.50 0.56  CALCIUM 8.8* 8.4*  --  8.4* 8.3*  MG  --   --  1.9 1.8  --     GFR: Estimated Creatinine Clearance: 37 mL/min (by C-G formula based on SCr of 0.56 mg/dL).  Liver Function Tests: No results for  input(s): AST, ALT, ALKPHOS, BILITOT, PROT, ALBUMIN in the last 168 hours.  CBG: Recent Labs  Lab 09/03/19 0059  GLUCAP 155*     Recent Results (from the past 240 hour(s))  SARS Coronavirus 2 by RT PCR (hospital order, performed in [facility] hospital lab) Nasopharyngeal Nasopharyngeal Swab     Status: None   Collection Time: 09/02/19 10:12 PM   Specimen: Nasopharyngeal Swab  Result Value Ref Range Status   SARS Coronavirus 2 NEGATIVE NEGATIVE Final    Comment: (NOTE) SARS-CoV-2 target nucleic acids are NOT DETECTED. The SARS-CoV-2 RNA is generally detectable in upper and lower respiratory specimens during the acute phase of infection. The lowest concentration of SARS-CoV-2 viral copies this assay can detect is 250 copies / mL. A negative result does not preclude SARS-CoV-2 infection and should not be used as the sole basis for treatment or other patient management decisions.  A negative result may occur with improper specimen collection / handling, submission of specimen other than nasopharyngeal swab, presence of viral mutation(s) within the areas targeted by this assay, and inadequate number of viral copies (<250 copies / mL). A negative result must be combined with clinical observations, patient history, and epidemiological information. Fact Sheet for Patients:   StrictlyIdeas.no Fact Sheet for Healthcare Providers: BankingDealers.co.za This test is not yet approved or cleared  by the [place] FDA and has been authorized for detection and/or diagnosis of SARS-CoV-2 by FDA under an Emergency Use Authorization (EUA).  This EUA will remain in effect (meaning this test can be used) for the duration of the COVID-19 declaration under Section 564(b)(1) of the Act, 21 U.S.C. section 360bbb-3(b)(1), unless the authorization is terminated or revoked sooner. Performed at [facility], [address] [place], [place]  [address]   MRSA PCR Screening     Status: None   Collection Time: 09/03/19  1:02 AM   Specimen: Nasopharyngeal  Result Value Ref Range Status   MRSA by PCR NEGATIVE NEGATIVE Final    Comment:        The GeneXpert MRSA Assay (FDA approved for NASAL specimens only), is one component of a comprehensive MRSA colonization surveillance program. It is not intended to diagnose MRSA infection nor to guide or monitor treatment for MRSA infections. Performed at [facility], [address], [place], [place] [address]          Radiology Studies: No results found.      Scheduled Meds: . aspirin  81 mg Oral Daily  . atorvastatin  80 mg Oral Daily  . Chlorhexidine Gluconate  Cloth  6 each Topical Daily  . enoxaparin (LOVENOX) injection  40 mg Subcutaneous Q24H  . metoprolol tartrate  12.5 mg Oral BID  . potassium chloride  40 mEq Oral Q6H  . sodium chloride flush  3 mL Intravenous Q12H  . ticagrelor  90 mg Oral BID   Continuous Infusions: . sodium chloride       LOS: 2 days    Time spent: 25 minutes    Dutchess Crosland, MD [facility]   To contact the attending provider between 7A-7P or the covering provider during after hours 7P-7A, please log into the web site [email or web address] and access using universal [place] password for that web site. If you do not have the password, please call the hospital operator.  09/05/2019, 2:55 PM

## 2019-09-05 NOTE — Progress Notes (Signed)
Progress Note  Patient Name: Elizabeth [name] Date of Encounter: 09/05/2019  Primary Cardiologist: Yvonne Kendall, MD  Subjective   No chest pain or sob.  Disoriented but pleasant.  Inpatient Medications    Scheduled Meds: . aspirin  81 mg Oral Daily  . atorvastatin  80 mg Oral Daily  . Chlorhexidine Gluconate Cloth  6 each Topical Daily  . enoxaparin (LOVENOX) injection  40 mg Subcutaneous Q24H  . metoprolol tartrate  12.5 mg Oral BID  . potassium chloride  40 mEq Oral Q6H  . sodium chloride flush  3 mL Intravenous Q12H  . ticagrelor  90 mg Oral BID   Continuous Infusions: . sodium chloride    . magnesium sulfate bolus IVPB     PRN Meds: sodium chloride, acetaminophen, haloperidol lactate, morphine injection, nitroGLYCERIN, ondansetron (ZOFRAN) IV, sodium chloride flush, traZODone   Vital Signs    Vitals:   09/05/19 0330 09/05/19 0430 09/05/19 0530 09/05/19 0630  BP: (!) 123/59 (!) 142/118 118/70 (!) 116/51  Pulse: (!) 53 (!) 52  (!) 52  Resp: (!) 22 15 13 20   Temp:      TempSrc:      SpO2: 97% 99%  95%  Weight:      Height:        Intake/Output Summary (Last 24 hours) at 09/05/2019 0805 Last data filed at 09/05/2019 0600 Gross per 24 hour  Intake 9 ml  Output 100 ml  Net -91 ml   Filed Weights   09/02/19 2043 09/03/19 0110  Weight: 49 kg 52.1 kg    Physical Exam   GEN: frail, in no acute distress.  HEENT: Grossly normal.  Neck: Supple, no JVD, carotid bruits, or masses. Cardiac: RRR, no murmurs, rubs, or gallops. No clubbing, cyanosis, edema.  Radials/DP/PT 2+ and equal bilaterally.  Respiratory:  Respirations regular and unlabored, clear to auscultation bilaterally. L wrist cath site w/o bleeding/bruit/hematoma. GI: Soft, nontender, nondistended, BS + x 4. MS: no deformity or atrophy. Skin: warm and dry, no rash. Neuro:  Strength and sensation are intact. Psych: Disoriented to time/place.  Normal affect.  Labs    Chemistry Recent Labs   Lab 09/03/19 0115 09/04/19 0534 09/05/19 0526  NA 142 139 139  K 3.4* 3.2* 3.2*  CL 109 107 104  CO2 26 25 26   GLUCOSE 186* 110* 113*  BUN 26* 15 14  CREATININE 0.63 0.50 0.56  CALCIUM 8.4* 8.4* 8.3*  GFRNONAA >60 >60 >60  GFRAA >60 >60 >60  ANIONGAP 7 7 9      Hematology Recent Labs  Lab 09/02/19 2049 09/03/19 0115 09/04/19 0534  WBC 11.5* 10.2 6.9  RBC 3.82* 3.89 3.97  HGB 11.3* 11.6* 11.6*  HCT 34.2* 34.8* 35.2*  MCV 89.5 89.5 88.7  MCH 29.6 29.8 29.2  MCHC 33.0 33.3 33.0  RDW 14.3 14.4 14.1  PLT 282 266 257    Cardiac Enzymes  Recent Labs  Lab 09/02/19 2049 09/03/19 0115 09/03/19 0910 09/03/19 1709  TROPONINIHS 148* 629* 4,112* 4,887*      Radiology    CT Head Wo Contrast  Result Date: 09/02/2019 CLINICAL DATA:  86 year old post unwitnessed fall. EXAM: CT HEAD WITHOUT CONTRAST TECHNIQUE: Contiguous axial images were obtained from the base of the skull through the vertex without intravenous contrast. COMPARISON:  Head CT 06/26/2017 FINDINGS: Brain: No intracranial hemorrhage, mass effect, or midline shift. Stable degree of atrophy and chronic small vessel ischemia. No hydrocephalus. The basilar cisterns are patent. Remote left cerebellar infarct  is unchanged. No evidence of territorial infarct or acute ischemia. No extra-axial or intracranial fluid collection. Vascular: Atherosclerosis of skullbase vasculature without hyperdense vessel or abnormal calcification. Skull: No fracture or focal lesion. Chronic bifrontal hyperostosis. Sinuses/Orbits: Paranasal sinuses and mastoid air cells are clear. The visualized orbits are unremarkable. Bilateral cataract resection. Other: None. IMPRESSION: 1. No acute intracranial abnormality. No skull fracture. 2. Stable atrophy and chronic small vessel ischemia. Remote left cerebellar infarct. Electronically Signed   By: Narda Rutherford M.D.   On: 09/02/2019 21:33   CT Cervical Spine Wo Contrast  Result Date: 09/02/2019  CLINICAL DATA:  Post unwitnessed fall. EXAM: CT CERVICAL SPINE WITHOUT CONTRAST TECHNIQUE: Multidetector CT imaging of the cervical spine was performed without intravenous contrast. Multiplanar CT image reconstructions were also generated. COMPARISON:  None. FINDINGS: Alignment: Trace anterolisthesis of C7 on T1 is likely degenerative. No traumatic subluxation. Skull base and vertebrae: No acute fracture. Cervical vertebral body heights are preserved. Minor superior endplate compression deformities of T1 and T2. the dens and skull base are intact. No evidence of focal lesion. Soft tissues and spinal canal: No prevertebral fluid or swelling. No visible canal hematoma. Disc levels: Diffuse multilevel degenerative disc disease with disc space narrowing and endplate spurring. There is prominent facet hypertrophy throughout. Upper chest: Minor septal thickening at the apices and may represent pulmonary edema. Other: None. IMPRESSION: 1. No acute fracture or traumatic subluxation of the cervical spine. 2. Multilevel degenerative disc disease and facet hypertrophy. 3. Minor chronic appearing superior endplate compression deformities of T1 and T2. 4. Possible pulmonary edema at the lung apices. Electronically Signed   By: Narda Rutherford M.D.   On: 09/02/2019 21:37   MR BRAIN WO CONTRAST  Result Date: 09/03/2019 CLINICAL DATA:  Neuro deficit, acute, stroke suspected. EXAM: MRI HEAD WITHOUT CONTRAST TECHNIQUE: Multiplanar, multiecho pulse sequences of the brain and surrounding structures were obtained without intravenous contrast. COMPARISON:  Head CT Sep 02, 2019 FINDINGS: The study is significantly degraded by motion. Brain: No acute infarction, hemorrhage, hydrocephalus, extra-axial collection or mass lesion. Small remote infarcts are seen in the bilateral cerebellar hemispheres and right centrum semiovale. Scattered and confluent foci of T2 hyperintensity are seen within the white matter of the cerebral  hemispheres, basal ganglia, thalami and pons, nonspecific, most likely related to chronic small vessel ischemia. Prominence of the cerebral and cerebellar sulci and supratentorial ventricles reflecting parenchymal volume loss. Vascular: Normal flow voids. Ectatic supraclinoid right ICA noted. Skull and upper cervical spine: Normal marrow signal. Sinuses/Orbits: Bilateral lens surgery. Paranasal sinuses are essentially clear. IMPRESSION: 1. No acute intracranial abnormality. 2. Small remote infarcts in the bilateral cerebellar hemispheres and right centrum semiovale. 3. Advanced chronic small vessel ischemia and moderate parenchymal volume loss. Electronically Signed   By: Baldemar Lenis M.D.   On: 09/03/2019 14:24   Telemetry    RSR - Personally Reviewed  Cardiac Studies   Cardiac Catheterization and Percutaneous Coronary Intervention5.25.2021            Left Anterior Descending  Vessel is moderate in size.  Mid LAD lesion 30% stenosed  Mid LAD lesion is 30% stenosed.  Mid LAD to Dist LAD lesion 20% stenosed  Mid LAD to Dist LAD lesion is 20% stenosed.  First Diagonal Branch          Vessel is small in size.          Second Diagonal Branch         Vessel is small in  size.         Third Diagonal Branch          Vessel is large in size.          Left Circumflex   Vessel is large.   Prox Cx lesion 30% stenosed   Prox Cx lesion is 30% stenosed.   Mid Cx to Dist Cx lesion 30% stenosed   Mid Cx to Dist Cx lesion is 30% stenosed.   First Obtuse Marginal Branch        Vessel is small in size.        Second Obtuse Marginal Branch      Vessel is moderate in size.      Third Obtuse Marginal Branch       Vessel is small in size.       Right Coronary Artery  Vessel is large.  Prox RCA lesion 70% stenosed  Prox RCA lesion is 70% stenosed. The lesion is thrombotic.  Mid RCA lesion 90%  stenosed  Mid RCA lesion is 90% stenosed. The lesion is thrombotic.  Dist RCA lesion 70% stenosed  Dist RCA lesion is 70% stenosed. The lesion is thrombotic. **overlapping synergy 4.0 x 12 mm (proximal) 4.0 x 32 mm (mid) and 4.0 x 38 mm (distal) drug-eluting stents  Right Posterior Descending Artery     Vessel is moderate in size.     Right Posterior Atrioventricular Artery    Vessel is large in size.     Ventricle The left ventricular systolic function is normal. LV end diastolic pressure is mildly elevated. LVEDP 15-20 mmHg. The left ventricular ejection fraction is greater than 65% by visual estimate. _____________  2D Echocardiogram 5.26.2021  1. Left ventricular ejection fraction, by estimation, is 65 to 70%. The  left ventricle has normal function. The left ventricle has no regional  wall motion abnormalities. Left ventricular diastolic parameters were  normal.  2. Right ventricular systolic function is normal. The right ventricular  size is normal.  3. The mitral valve is normal in structure. Trivial mitral valve  regurgitation. No evidence of mitral stenosis.  4. The aortic valve is normal in structure. Aortic valve regurgitation is  not visualized. No aortic stenosis is present.   Patient Profile     86 y.o.femalew/ a h/o dementia, who was admitted 5/25 following presentation with syncope and inferiolateral STEMI. Now s/p PCI/DES x 3 to the RCA.  Assessment & Plan    1.  Inferolateral STEMI/CAD: Pt presented following syncopal episode w/ report of right sided wkns. Head CT w/o acute finding.  ECG w/ inflat ST elev and HsTrop initially 148  4000.  Cath w/ sev prox/mid/distal RCA dzs, which was treated w/ DES x 3. Echo w/ nl EF.  No significant valvular dzs.  No chest pain or dyspnea.  Cont asa, statin, brilinta,  blocker.  Poor candidate for cardiac rehab 2/2 dementia.  Awaiting SNF placment.  2.  Right sided wkns/Remote stroke:  Reported by POA on  arrival.  Head CT w/o acute findings.  MRI: 1. No acute intracranial abnormality. 2. Small remote infarcts in the bilateral cerebellar hemispheres and right centrum semiovale. 3. Advanced chronic small vessel ischemia and moderate parenchymal volume loss.  Cont asa/statin.  3.  HL:  LDL 89.  Cont high potency statin.  4.  Hypokalemia:  Persistent despite daily supplementation and Mg supplementation.  More supp ordered for this AM.  5.  Dementia:  Per IM.  Sundowning.  Was living alone.  Awaiting SNF placmement.  Signed, Murray Hodgkins, NP  09/05/2019, 8:05 AM    For questions or updates, please contact   Please consult [email or web address] for contact info under Cardiology/STEMI.

## 2019-09-05 NOTE — Evaluation (Signed)
Occupational Therapy Evaluation Patient Details Name: Elizabeth [name] MRN: [id number] DOB: [birth year] Today's Date: 09/05/2019    History of Present Illness Elizabeth [name] is a 21yoF who [facility] c AMS, decreased responsiveness, syncope. PMH: dementia.   Clinical Impression   Elizabeth [name] was seen for OT evaluation this date. Pt oriented to self only and unable to follow logical cues to determine location/situation. Pt unable to state PLOF or home setup this date. Per [facility], pt lives alone c caregiver during the day. Pt presents to acute OT demonstrating impaired ADL performance, functional cognition, and functional mobility 2/2 decreased insight into deficits, functional strength/ROM/balance deficits, and decreased fine motor control. Pt currently requires SETUP + MIN A self-feeding at bed level, SETUP self-drinking bed level. MOD A don B socks bed level - assist to thread. OOB mobility assessment deferred this date 2/2 lunch tray arrived at start of session and RN reports pt is incontinent of bowel/bladder. Pt would benefit from skilled OT to address noted impairments and functional limitations (see below for any additional details) in order to maximize safety and independence while minimizing falls risk and caregiver burden. Upon hospital discharge, recommend STR to maximize pt safety and return to PLOF.     Follow Up Recommendations  SNF;Supervision/Assistance - 24 hour    Equipment Recommendations       Recommendations for Other Services       Precautions / Restrictions Precautions Precautions: Fall Precaution Comments: RN reports intermittently combative / agitated  Restrictions Weight Bearing Restrictions: No      Mobility Bed Mobility Overal bed mobility: Needs Assistance Bed Mobility: Rolling Rolling: Min assist         General bed mobility comments: MIN A bed level mobility  Transfers                 General transfer comment: Deferred  OOB mobility 2/2 lunch tray arriving and RN reports pt incontinent of bowel/bladder    Balance Overall balance assessment: Needs assistance                                         ADL either performed or assessed with clinical judgement   ADL Overall ADL's : Needs assistance/impaired                                       General ADL Comments: SETUP + MIN A self-feeding at bed level, SETUP self-drinking bed level. MOD A don B socks bed level - assist to thread     Vision         Perception     Praxis      Pertinent Vitals/Pain Pain Assessment: No/denies pain     Hand Dominance Right   Extremity/Trunk Assessment Upper Extremity Assessment Upper Extremity Assessment: Generalized weakness(B grip 3/5, BUE AROM WFL, FNF intact c increased time + VCs)   Lower Extremity Assessment Lower Extremity Assessment: Generalized weakness       Communication Communication Communication: No difficulties   Cognition Arousal/Alertness: Awake/alert Behavior During Therapy: (distractable ) Overall Cognitive Status: No family/caregiver present to determine baseline cognitive functioning Area of Impairment: Orientation;Attention;Memory;Following commands;Safety/judgement;Awareness;Problem solving                 Orientation Level: Disoriented to;Place;Time;Situation Current Attention Level: Alternating;Divided Memory: Decreased  recall of precautions;Decreased short-term memory Following Commands: Follows one step commands inconsistently;Follows one step commands with increased time Safety/Judgement: Decreased awareness of safety;Decreased awareness of deficits Awareness: Anticipatory Problem Solving: Slow processing;Decreased initiation;Difficulty sequencing;Requires verbal cues;Requires tactile cues     General Comments  Vitals stable t/o    Exercises Exercises: Other exercises Other Exercises Other Exercises: Pt educated re: OT role,  adapted dining techniques, importance of positioning, reorientation Other Exercises: Self-feeding/drinking, bed mobility, LBD, UBD simulated   Shoulder Instructions      Home Living Family/patient expects to be discharged to:: Unsure                                 Additional Comments: Pt unable to report background/PLOF       Prior Functioning/Environment          Comments: Unable to determine from pt         OT Problem List: Decreased strength;Decreased activity tolerance;Decreased range of motion;Impaired balance (sitting and/or standing);Decreased coordination;Decreased cognition;Decreased safety awareness;Decreased knowledge of use of DME or AE      OT Treatment/Interventions: Self-care/ADL training;Therapeutic exercise;DME and/or AE instruction;Energy conservation;Therapeutic activities;Cognitive remediation/compensation;Patient/family education;Balance training    OT Goals(Current goals can be found in the care plan section) Acute Rehab OT Goals Patient Stated Goal: Unable to state OT Goal Formulation: Patient unable to participate in goal setting Time For Goal Achievement: 09/19/19 Potential to Achieve Goals: Poor ADL Goals Pt Will Perform Eating: with set-up;with supervision;bed level Pt Will Perform Upper Body Dressing: with set-up;bed level;with supervision Pt Will Transfer to Toilet: with min assist;stand pivot transfer;bedside commode(c LRAD PRN)  OT Frequency: Min 1X/week   Barriers to D/C: Inaccessible home environment;Decreased caregiver support          Co-evaluation              AM-PAC OT "6 Clicks" Daily Activity     Outcome Measure Help from another person eating meals?: A Little Help from another person taking care of personal grooming?: A Little Help from another person toileting, which includes using toliet, bedpan, or urinal?: A Lot Help from another person bathing (including washing, rinsing, drying)?: A Lot Help from  another person to put on and taking off regular upper body clothing?: A Little Help from another person to put on and taking off regular lower body clothing?: A Lot 6 Click Score: 15   End of Session Nurse Communication: Other (comment)(Pt benefits from finger foods)  Activity Tolerance: Patient tolerated treatment well Patient left: in bed;with call bell/phone within reach;with bed alarm set  OT Visit Diagnosis: Unsteadiness on feet (R26.81);Other abnormalities of gait and mobility (R26.89)                Time: 2703-5009 OT Time Calculation (min): 24 min Charges:  OT General Charges $OT Visit: 1 Visit OT Evaluation $OT Eval Moderate Complexity: 1 Mod OT Treatments $Self Care/Home Management : 8-22 mins  Kathie Dike, M.S. OTR/L  09/05/19, 1:23 PM

## 2019-09-05 NOTE — TOC Progression Note (Signed)
Transition of Care [facility]) - Progression Note    Patient Details  Name: Elizabeth [name] MRN: [id number] Date of Birth: October 02, 1927  Transition of Care [facility]) CM/SW Contact  Liliana Cline, LCSW Phone Number: 09/05/2019, 12:37 PM  Clinical Narrative:   CSW called [facility] Representative Verlon Au to follow-up on if they can offer a bed for patient as [facility] is the POA's first choice. Verlon Au reported she will review the chart then follow-up. Provided contact information for after 2 today CSW coverage [place] to [place].     Expected Discharge Plan: Skilled Nursing Facility Barriers to Discharge: Continued Medical Work up  Expected Discharge Plan and Services Expected Discharge Plan: Skilled Nursing Facility       Living arrangements for the past 2 months: Single Family Home                                       Social Determinants of Health (SDOH) Interventions    Readmission Risk Interventions No flowsheet data found.

## 2019-09-05 NOTE — Progress Notes (Signed)
Pt transferred to room 212. Per previous RN, report called to receiving nurse during previous shift.

## 2019-09-06 DIAGNOSIS — R54 Age-related physical debility: Secondary | ICD-10-CM

## 2019-09-06 DIAGNOSIS — F0390 Unspecified dementia without behavioral disturbance: Secondary | ICD-10-CM

## 2019-09-06 LAB — BASIC METABOLIC PANEL
Anion gap: 8 (ref 5–15)
BUN: 19 mg/dL (ref 8–23)
CO2: 25 mmol/L (ref 22–32)
Calcium: 8.3 mg/dL — ABNORMAL LOW (ref 8.9–10.3)
Chloride: 105 mmol/L (ref 98–111)
Creatinine, Ser: 0.67 mg/dL (ref 0.44–1.00)
GFR calc Af Amer: >60 mL/min (ref >60)
GFR calc non Af Amer: >60 mL/min (ref >60)
Glucose, Bld: 105 mg/dL — ABNORMAL HIGH (ref 70–99)
Potassium: 3.4 mmol/L — ABNORMAL LOW (ref 3.5–5.1)
Sodium: 138 mmol/L (ref 135–145)

## 2019-09-06 NOTE — Progress Notes (Signed)
PROGRESS NOTE  NALA KACHEL CXK:[id number] DOB: [birth year] DOA: 09/02/2019 PCP: Barbette Reichmann, MD  Brief History   86 year old female with history of severe dementia presented with altered mental status, syncope and poor responsiveness.  Patient was seen on her driveway on the evening of admission and then found shortly by her neighbor lying on the ground for unknown duration.  Her caregiver and sister-in-law had noticed that she had some right-sided weakness for the past 1 week. In the ED patient was found to have potassium of 3.4 and markedly elevated high sensitive troponin of 629.  EKG showed acute inferior ST elevation infarct. Patient started on aspirin, high-dose statin, Kengreal and Brilinta and taken to Cath Lab urgently and had 3 drug-eluting stent to the RCA.  Tolerated procedure well.  She has been evaluated by PT/OT and recommendation has been for SNF.   Consultants  . Cardiology  Procedures  . PCI, stent placement to RCA  Antibiotics   Anti-infectives (From admission, onward)   None    .  Subjective  Patient is resting comfortably. No new complaints.  Objective   Vitals:  Vitals:   09/06/19 0822 09/06/19 1216  BP: (!) 106/53 118/65  Pulse: 64 60  Resp: 16 12  Temp: 97.9 F (36.6 C) 98.1 F (36.7 C)  SpO2: 100% 99%   Exam:  Constitutional:  . The patient is elderly, weak, and frail. No new complaints. Respiratory:  . No increased work of breathing. . No wheezes, rales, or rhonchi . No tactile fremitus Cardiovascular:  . Regular rate and rhythm . No murmurs, ectopy, or gallups. . No lateral PMI. No thrills. Abdomen:  . Abdomen is soft, non-tender, non-distended . No hernias, masses, or organomegaly . Normoactive bowel sounds.  Musculoskeletal:  . No cyanosis, clubbing, or edema Skin:  . No rashes, lesions, ulcers . palpation of skin: no induration or nodules Neurologic:  Unable to evaluate as patient is unable to cooperate with  exam. Psychiatric:  . Unable to evaluate as patient is unable to cooperate with exam.  I have personally reviewed the following:   Today's Data  . Vitals, BMP  Cardiology Data  . EKG, Troponins  Scheduled Meds: . aspirin  81 mg Oral Daily  . atorvastatin  80 mg Oral Daily  . enoxaparin (LOVENOX) injection  40 mg Subcutaneous Q24H  . metoprolol tartrate  12.5 mg Oral BID  . sodium chloride flush  3 mL Intravenous Q12H  . ticagrelor  90 mg Oral BID   Continuous Infusions: . sodium chloride      Principal Problem:   STEMI (ST elevation myocardial infarction) (HCC) Active Problems:   STEMI involving right coronary artery (HCC)   LOS: 3 days   A & P   Inferior STEMI: S/p PCI x3 to RCA.  Cardiology recommends dual antiplatelet therapy for 12 months.  Currently on aspirin and Brilinta for now and possibly switch Brilinta to Plavix after 1 month. Continue Lipitor and Lopressor. 2D echo with normal EF.  Right-sided weakness: Unclear duration. MRI brain shows no acute finding with chronic small vessel ischemia and a remote bilateral cerebellar infarct.  on aspirin and statin. PT recommends SNF.  Dementia, moderate to severe: Seems to have worsened during hospitalization, frequently trying to get out of bed.  Essential hypertension: Continue current meds  Hypokalemia: Replenished. Monitor.  I have seen and examined this patient myself. I have spent 34 minutes in her evaluation and care.  DVT prophylaxis: Subcu Lovenox Code Status: Full code  Family Communication: None available. Disposition:   Status is: Inpatient  Remains inpatient appropriate because:Unsafe d/c plan. Patient lives home alone and given her medical condition, generalized weakness and severe dementia she is unsafe to be discharged home alone at this time.  Daughter-in-law is planning to arrange a caregiver at home in future.  Dispo: The patient is from: Home  Anticipated d/c is to:  SNF  Anticipated d/c date is: 1 day  Patient currently is not medically stable to d/c. Due to no safe DC plan. Seara Hinesley, DO [facility] Direct contact: see [email or web address]  7PM-7AM contact night coverage as above 09/06/2019, 3:42 PM  LOS: 3 days

## 2019-09-06 NOTE — Progress Notes (Signed)
Spoke with sister-in-law Lurena Joiner at patient's bedside. Per Lurena Joiner she is unsure if she has HPOA. She does have POA paperwork which she had on hand. Per Lurena Joiner, patient does not have any immediate family members except nieces and nephews. Aida Raider that she should consider implementing a HPOA as the patient is not able to make medical decisions on her own. Chaplain consulted to contact [place].   Madie Reno, RN

## 2019-09-07 LAB — BASIC METABOLIC PANEL
Anion gap: 8 (ref 5–15)
BUN: 22 mg/dL (ref 8–23)
CO2: 25 mmol/L (ref 22–32)
Calcium: 8.2 mg/dL — ABNORMAL LOW (ref 8.9–10.3)
Chloride: 104 mmol/L (ref 98–111)
Creatinine, Ser: 0.65 mg/dL (ref 0.44–1.00)
GFR calc Af Amer: >60 mL/min (ref >60)
GFR calc non Af Amer: >60 mL/min (ref >60)
Glucose, Bld: 139 mg/dL — ABNORMAL HIGH (ref 70–99)
Potassium: 3.4 mmol/L — ABNORMAL LOW (ref 3.5–5.1)
Sodium: 137 mmol/L (ref 135–145)

## 2019-09-07 MED ORDER — POTASSIUM CHLORIDE 10 MEQ/100ML IV SOLN
10.0000 meq | INTRAVENOUS | Status: AC
  Administered 2019-09-07 (×4): 10 meq via INTRAVENOUS
  Filled 2019-09-07 (×2): qty 100

## 2019-09-07 NOTE — Progress Notes (Signed)
PROGRESS NOTE  JLYN CERROS TJQ:[id number] DOB: 04-Nov-1927 DOA: 09/02/2019 PCP: Barbette Reichmann, MD  Brief History   86 year old female with history of severe dementia presented with altered mental status, syncope and poor responsiveness.  Patient was seen on her driveway on the evening of admission and then found shortly by her neighbor lying on the ground for unknown duration.  Her caregiver and sister-in-law had noticed that she had some right-sided weakness for the past 1 week. In the ED patient was found to have potassium of 3.4 and markedly elevated high sensitive troponin of 629.  EKG showed acute inferior ST elevation infarct. Patient started on aspirin, high-dose statin, Kengreal and Brilinta and taken to Cath Lab urgently and had 3 drug-eluting stent to the RCA.  Tolerated procedure well.  She has been evaluated by PT/OT and recommendation has been for SNF. She is awaiting placement.  Consultants  . Cardiology  Procedures  . PCI, stent placement to RCA  Antibiotics   Anti-infectives (From admission, onward)   None     Subjective  Patient is resting comfortably. No new complaints.  Objective   Vitals:  Vitals:   09/07/19 1028 09/07/19 1035  BP: 108/74 108/66  Pulse: 68 66  Resp:    Temp:    SpO2:  96%   Exam:  Constitutional:  . The patient is elderly, weak, and frail. No new complaints. Respiratory:  . No increased work of breathing. . No wheezes, rales, or rhonchi . No tactile fremitus Cardiovascular:  . Regular rate and rhythm . No murmurs, ectopy, or gallups. . No lateral PMI. No thrills. Abdomen:  . Abdomen is soft, non-tender, non-distended . No hernias, masses, or organomegaly . Normoactive bowel sounds.  Musculoskeletal:  . No cyanosis, clubbing, or edema Skin:  . No rashes, lesions, ulcers . palpation of skin: no induration or nodules Neurologic:  Unable to evaluate as patient is unable to cooperate with exam. Psychiatric:   . Unable to evaluate as patient is unable to cooperate with exam.  I have personally reviewed the following:   Today's Data  . Vitals, BMP  Cardiology Data  . EKG, Troponins  Scheduled Meds: . aspirin  81 mg Oral Daily  . atorvastatin  80 mg Oral Daily  . enoxaparin (LOVENOX) injection  40 mg Subcutaneous Q24H  . metoprolol tartrate  12.5 mg Oral BID  . sodium chloride flush  3 mL Intravenous Q12H  . ticagrelor  90 mg Oral BID   Continuous Infusions: . sodium chloride      Principal Problem:   STEMI (ST elevation myocardial infarction) (HCC) Active Problems:   STEMI involving right coronary artery (HCC)   LOS: 4 days   A & P   Inferior STEMI: S/p PCI x3 to RCA.  Cardiology recommends dual antiplatelet therapy for 12 months.  Currently on aspirin and Brilinta for now and possibly switch Brilinta to Plavix after 1 month. Continue Lipitor and Lopressor. 2D echo with normal EF.  Right-sided weakness: Unclear duration. MRI brain shows no acute finding with chronic small vessel ischemia and a remote bilateral cerebellar infarct.  on aspirin and statin. PT recommends SNF.  Hypokalemia: Supplement potassium and monitor.  Dementia, moderate to severe: Seems to have worsened during hospitalization, frequently trying to get out of bed.  Essential hypertension: Continue current meds  I have seen and examined this patient myself. I have spent 32 minutes in her evaluation and care.  DVT prophylaxis: Subcu Lovenox Code Status: Full code Family Communication: None  available. Disposition:   Status is: Inpatient  Remains inpatient appropriate because:Unsafe d/c plan. Patient lives home alone and given her medical condition, generalized weakness and severe dementia she is unsafe to be discharged home alone at this time.  Daughter-in-law is planning to arrange a caregiver at home in future.  Dispo: The patient is from: Home  Anticipated d/c is to:  SNF  Anticipated d/c date is: 1 day  Patient currently is not medically stable to d/c due to no safe DC. plan. Augustino Savastano, DO [facility] Direct contact: see [email or web address]  7PM-7AM contact night coverage as above 09/07/2019, 12:29 PM  LOS: 3 days

## 2019-09-07 NOTE — Progress Notes (Signed)
Physical Therapy Treatment Patient Details Name: Elizabeth [name] MRN: [id number] DOB: [birth year] Today's Date: 09/07/2019    History of Present Illness Elizabeth [name] is a 70yoF who comes to [facility] on 5/[facility] c AMS, decreased responsiveness, syncope. PMH: dementia.    PT Comments    Pt in bed.  Noted to be inc large watery BM.  She is unaware of incontinence.  Care provided and RN aware.  Rolling left/right with min a x 1 for care.  Bed mobility with min a x 1 and she is able to walk to door and back in room with RW and min a x 1 with seated rest on bed between each lap.  While she continues to require min a x 1 for all mobility with general fatigue noted with mobility, she is progressing towards goals.  SNF remains appropriate due to cognition and assist level.  Returned to bed with RN in room for care/meds.   Follow Up Recommendations  SNF;Supervision/Assistance - 24 hour;Supervision for mobility/OOB     Equipment Recommendations  Rolling walker with 5" wheels    Recommendations for Other Services       Precautions / Restrictions Precautions Precautions: Fall Restrictions Weight Bearing Restrictions: No    Mobility  Bed Mobility Overal bed mobility: Needs Assistance Bed Mobility: Rolling;Supine to Sit;Sit to Supine Rolling: Min assist   Supine to sit: Min assist Sit to supine: Min assist      Transfers Overall transfer level: Needs assistance Equipment used: Rolling walker (2 wheeled) Transfers: Sit to/from Stand Sit to Stand: Min assist;From elevated surface            Ambulation/Gait Ambulation/Gait assistance: Herbalist (Feet): 15 Feet Assistive device: Rolling walker (2 wheeled) Gait Pattern/deviations: Step-through pattern;Decreased step length - right;Decreased step length - left;Trunk flexed Gait velocity: decreased   General Gait Details: to door and back x 2 with seated rest for minimal fatigue   Stairs              Wheelchair Mobility    Modified Rankin (Stroke Patients Only)       Balance Overall balance assessment: Needs assistance Sitting-balance support: Feet unsupported;Single extremity supported Sitting balance-Leahy Scale: Fair     Standing balance support: Bilateral upper extremity supported;During functional activity Standing balance-Leahy Scale: Fair                              Cognition Arousal/Alertness: Awake/alert Behavior During Therapy: WFL for tasks assessed/performed Overall Cognitive Status: Within Functional Limits for tasks assessed                                        Exercises      General Comments        Pertinent Vitals/Pain Pain Assessment: No/denies pain    Home Living                      Prior Function            PT Goals (current goals can now be found in the care plan section) Progress towards PT goals: Progressing toward goals    Frequency    Min 2X/week      PT Plan Current plan remains appropriate    Co-evaluation  AM-PAC PT "6 Clicks" Mobility   Outcome Measure  Help needed turning from your back to your side while in a flat bed without using bedrails?: A Little Help needed moving from lying on your back to sitting on the side of a flat bed without using bedrails?: A Little Help needed moving to and from a bed to a chair (including a wheelchair)?: A Little Help needed standing up from a chair using your arms (e.g., wheelchair or bedside chair)?: A Little Help needed to walk in hospital room?: A Little Help needed climbing 3-5 steps with a railing? : A Lot 6 Click Score: 17    End of Session Equipment Utilized During Treatment: Gait belt Activity Tolerance: Patient tolerated treatment well Patient left: in bed;with call bell/phone within reach;with bed alarm set Nurse Communication: Mobility status       Time: 1325-1345 PT Time Calculation (min) (ACUTE  ONLY): 20 min  Charges:  $Gait Training: 8-22 mins                     Danielle Dess, PTA 09/07/19, 2:32 PM

## 2019-09-07 NOTE — Progress Notes (Signed)
Ch visited with Pt to complete request to complete AD from yesterday. Pt was in bed, was not very coherent, was also not understanding Ch's questions. Pt requested for prayer, to get better and go home. Ch prayed with Pt. Pt's mental state would not allow AD completion. Ch left AD packet with Pt.

## 2019-09-08 DIAGNOSIS — E43 Unspecified severe protein-calorie malnutrition: Secondary | ICD-10-CM | POA: Insufficient documentation

## 2019-09-08 DIAGNOSIS — R197 Diarrhea, unspecified: Secondary | ICD-10-CM

## 2019-09-08 LAB — MAGNESIUM: Magnesium: 1.8 mg/dL (ref 1.7–2.4)

## 2019-09-08 LAB — BASIC METABOLIC PANEL
Anion gap: 10 (ref 5–15)
BUN: 20 mg/dL (ref 8–23)
CO2: 21 mmol/L — ABNORMAL LOW (ref 22–32)
Calcium: 8.7 mg/dL — ABNORMAL LOW (ref 8.9–10.3)
Chloride: 105 mmol/L (ref 98–111)
Creatinine, Ser: 0.61 mg/dL (ref 0.44–1.00)
GFR calc Af Amer: >60 mL/min (ref >60)
GFR calc non Af Amer: >60 mL/min (ref >60)
Glucose, Bld: 110 mg/dL — ABNORMAL HIGH (ref 70–99)
Potassium: 3.4 mmol/L — ABNORMAL LOW (ref 3.5–5.1)
Sodium: 136 mmol/L (ref 135–145)

## 2019-09-08 MED ORDER — POTASSIUM CHLORIDE 20 MEQ PO PACK
40.0000 meq | PACK | Freq: Every day | ORAL | Status: DC
  Administered 2019-09-08 – 2019-09-16 (×9): 40 meq via ORAL
  Filled 2019-09-08 (×9): qty 2

## 2019-09-08 MED ORDER — METOPROLOL TARTRATE 25 MG PO TABS
12.5000 mg | ORAL_TABLET | Freq: Two times a day (BID) | ORAL | Status: DC
  Administered 2019-09-08 – 2019-09-16 (×11): 12.5 mg via ORAL
  Filled 2019-09-08 (×13): qty 1

## 2019-09-08 MED ORDER — ENSURE ENLIVE PO LIQD: 237.0000 mL | Freq: Two times a day (BID) | ORAL | Status: DC

## 2019-09-08 MED ORDER — LOPERAMIDE HCL 1 MG/7.5ML PO SUSP
1.0000 mg | ORAL | Status: DC | PRN
  Administered 2019-09-08: 1 mg via ORAL
  Filled 2019-09-08 (×3): qty 7.5

## 2019-09-08 NOTE — TOC Progression Note (Addendum)
Transition of Care [facility]) - Progression Note    Patient Details  Name: Elizabeth [name] MRN: [id number] Date of Birth: 07-09-1927  Transition of Care [facility]) CM/SW Contact  Liliana Cline, LCSW Phone Number: 09/08/2019, 10:38 AM  Clinical Narrative:   CSW followed-up with [facility] who reported they are not able to accept patient at this time.  Spoke to Dr. Gonzella Lex who reported patient is medically ready for discharge.  CSW called patient's POA, Lurena Joiner, to discuss current bed offers at [facility] and [facility]. Initially, Lurena Joiner reported she would prefer [facility] and CSW called Gavin Pound with [facility] to accept bed offer. Then CSW received a return call from Lurena Joiner that [facility] is more convenient for her and she would prefer [facility] [place]. Updated [facility].  CSW called [facility] Administrator, sports. Accepted bed offer. Tammy reported needs COVID test results that are within 4 days, she suggested ordering COVID test tomorrow. Tammy reported the facility will start insurance authorization and let us know when it is approved. She reported this process has been taking 3-4 days typically with patient's insurance [facility]).   CSW updated POA, MD, and RN, and will continue to follow.       Expected Discharge Plan: Skilled Nursing Facility Barriers to Discharge: Continued Medical Work up  Expected Discharge Plan and Services Expected Discharge Plan: Skilled Nursing Facility       Living arrangements for the past 2 months: Single Family Home                                       Social Determinants of Health (SDOH) Interventions    Readmission Risk Interventions No flowsheet data found.

## 2019-09-08 NOTE — Care Management Important Message (Signed)
Important Message  Patient Details  Name: Elizabeth [name] MRN: [id number] Date of Birth: 1928-01-27   Medicare Important Message Given:  Yes  Reviewed Medicare IM with Floreen Comber via phone.  Copy sent securely to email address provided: [email or web address].   Johnell Comings 09/08/2019, 1:32 PM

## 2019-09-08 NOTE — Progress Notes (Signed)
Initial Nutrition Assessment  DOCUMENTATION CODES:   Severe malnutrition in context of chronic illness  INTERVENTION:  Will discontinue Ensure Enlive now that patient is ordered for nectar-thick liquids.  Provide Hormel Shake po BID with lunch and dinner, each supplement provides 520 kcal and 22 grams of protein.  Provide Magic cup BID with lunch and dinner, each supplement provides 290 kcal and 9 grams of protein.  Monitor magnesium, potassium, and phosphorus daily for at least 3 days, MD to replete as needed, as pt is at risk for refeeding syndrome given severe malnutrition.  NUTRITION DIAGNOSIS:   Severe Malnutrition related to chronic illness as evidenced by severe fat depletion, severe muscle depletion.  GOAL:   Patient will meet greater than or equal to 90% of their needs  MONITOR:   PO intake, Supplement acceptance, Labs, Weight trends, I & O's  REASON FOR ASSESSMENT:   Malnutrition Screening Tool    ASSESSMENT:   86 year old female with PMHx of dementia admitted with AMS, inferior STEMI s/p PCI x3 to RCA, right-sided weakness.   Met with patient at bedside. She reports her appetite is unchanged from baseline. According to chart patient ate 80% of her breakfast yesterday and 30% of her dinner last night. She had finished all of her pancake and sausage at time of RD assessment. Patient reported she did not have any difficulty with chewing or swallowing. Patient was amenable to drinking Ensure and eating Magic Cup to help meet calorie/protein needs.  Discussed with RN after assessment who also reported no known difficulties with chewing/swallowing except for patient having to take one medication at a time. Diet has since been downgraded to dysphagia 3 with nectar-thick. Will need to provide a different oral nutrition supplement than Ensure while patient is on nectar-thick diet.  Patient reports she is weight-stable and denies any recent weight loss. According to chart  patient was 49.9 kg on 06/26/2017, which is the only other available weight in chart. She is currently 52.1 kg (114.86 lbs).  Medications reviewed and include: potassium chloride 40 mEq daily.  Labs reviewed: Potassium 3.4, CO2 21.  NUTRITION - FOCUSED PHYSICAL EXAM:    Most Recent Value  Orbital Region  Severe depletion  Upper Arm Region  Severe depletion  Thoracic and Lumbar Region  Severe depletion  Buccal Region  Severe depletion  Temple Region  Severe depletion  Clavicle Bone Region  Severe depletion  Clavicle and Acromion Bone Region  Severe depletion  Scapular Bone Region  Severe depletion  Dorsal Hand  Severe depletion  Patellar Region  Severe depletion  Anterior Thigh Region  Severe depletion  Posterior Calf Region  Severe depletion  Edema (RD Assessment)  -- [non pitting]  Hair  Reviewed  Eyes  Reviewed  Mouth  Reviewed  Skin  Reviewed  Nails  Reviewed     Diet Order:   Diet Order            DIET DYS 3 Room service appropriate? Yes; Fluid consistency: Nectar Thick  Diet effective now             EDUCATION NEEDS:   No education needs have been identified at this time  Skin:  Skin Assessment: Reviewed RN Assessment  Last BM:  09/08/2019 - small type 6  Height:   Ht Readings from Last 1 Encounters:  09/03/19 _0  (1.575 m)   Weight:   Wt Readings from Last 1 Encounters:  09/03/19 52.1 kg   BMI:  Body mass index  is 21.01 kg/m.  Estimated Nutritional Needs:   Kcal:  1400-1600  Protein:  70-80 grams  Fluid:  1.4-1.6 L/day  Jacklynn Barnacle, MS, RD, LDN Pager number available on Amion

## 2019-09-08 NOTE — Progress Notes (Signed)
PROGRESS NOTE    Elizabeth [name]  TGP:[id number] DOB: 10/26/1927 DOA: 09/02/2019 PCP: Barbette Reichmann, MD     Chief Complaint  Patient presents with  . Fall  . Altered Mental Status    Brief Narrative:  86 year old female with history of severe dementia presented with altered mental status, syncope and poor responsiveness.  Patient was seen on her driveway on the evening of admission and then found shortly by her neighbor lying on the ground for unknown duration.  Her caregiver and sister-in-law had noticed that she had some right-sided weakness for the past 1 week. In the ED patient was found to have potassium of 3.4 and markedly elevated high sensitive troponin of 629.  EKG showed acute inferior ST elevation infarct. Patient started on aspirin, high-dose statin, Kengreal and Brilinta and taken to Cath Lab urgently and had 3 drug-eluting stent to the RCA.  Tolerated procedure well.  Assessment & Plan:   Principal problem Inferior STEMI S/p PCI x3 to RCA.  Cardiology recommends dual antiplatelet therapy for 12 months.  Currently on aspirin and Brilinta for now and possibly switch Brilinta to Plavix after 1 month. Continue Lipitor and Lopressor. 2D echo with normal EF.  Active problems Right-sided weakness Unclear duration.  MRI brain shows no acute finding with chronic small vessel ischemia and a remote bilateral cerebellar infarct.  on aspirin and statin.  PT recommends SNF.  Dementia, moderate to severe Episodes of delirium while in the hospital.  Stable past 24 hours.  Essential hypertension Continue current meds  Hypokalemia Replenished.  Will add daily potassium supplement.  Diarrhea We will check GI pathogen panel.  Imodium as needed.  DVT prophylaxis: Subcu Lovenox Code Status: Full code Family Communication: Updated daughter-in-law.  Disposition:   Status is: Inpatient  Remains inpatient appropriate because:Unsafe d/c plan.  Awaiting discharge  to SNF.   Dispo: The patient is from: Home              Anticipated d/c is to: SNF              Anticipated d/c date is: 1 day              Patient currently is not medically stable to d/c.        Consultants:   Cardiology   Procedures: Heart catheterization, 2D echo    Subjective: Seen and examined.  Remains pleasantly confused.  Reportedly having several episodes of nonbloody diarrhea (7 in the past 24 hours).  Objective: Vitals:   09/07/19 1235 09/07/19 1944 09/08/19 0525 09/08/19 1144  BP: (!) 103/55 123/63 (!) 112/57 115/65  Pulse: 71 69 74 63  Resp: 16 18 18    Temp: 98.4 F (36.9 C) 99.1 F (37.3 C) 98.8 F (37.1 C) 98.4 F (36.9 C)  TempSrc: Oral Oral Oral Oral  SpO2: 98% 100% 97% 97%  Weight:      Height:        Intake/Output Summary (Last 24 hours) at 09/08/2019 1223 Last data filed at 09/08/2019 09/10/2019 Gross per 24 hour  Intake 680 ml  Output 0 ml  Net 680 ml   Filed Weights   09/02/19 2043 09/03/19 0110  Weight: 49 kg 52.1 kg    Examination:  General: Not in distress HEENT: Moist with, supple neck Chest: Clear bilaterally CVs: Normal S1-S2 GI: Soft, nondistended, nontender Musculoskeletal: Warm, no edema   Data Reviewed: I have personally reviewed following labs and imaging studies  CBC: Recent Labs  Lab 09/02/19 2049 09/03/19  0115 09/04/19 0534  WBC 11.5* 10.2 6.9  HGB 11.3* 11.6* 11.6*  HCT 34.2* 34.8* 35.2*  MCV 89.5 89.5 88.7  PLT 282 266 540    Basic Metabolic Panel: Recent Labs  Lab 09/03/19 0115 09/03/19 0919 09/04/19 0534 09/05/19 0526 09/06/19 0732 09/07/19 0437 09/08/19 0504  NA   < >  --  139 139 138 137 136  K   < >  --  3.2* 3.2* 3.4* 3.4* 3.4*  CL   < >  --  107 104 105 104 105  CO2   < >  --  25 26 25 25  21*  GLUCOSE   < >  --  110* 113* 105* 139* 110*  BUN   < >  --  15 14 19 22 20   CREATININE   < >  --  0.50 0.56 0.67 0.65 0.61  CALCIUM   < >  --  8.4* 8.3* 8.3* 8.2* 8.7*  MG  --  1.9 1.8  --   --    --  1.8   < > = values in this interval not displayed.    GFR: Estimated Creatinine Clearance: 37 mL/min (by C-G formula based on SCr of 0.61 mg/dL).  Liver Function Tests: No results for input(s): AST, ALT, ALKPHOS, BILITOT, PROT, ALBUMIN in the last 168 hours.  CBG: Recent Labs  Lab 09/03/19 0059  GLUCAP 155*     Recent Results (from the past 240 hour(s))  SARS Coronavirus 2 by RT PCR (hospital order, performed in [facility] hospital lab) Nasopharyngeal Nasopharyngeal Swab     Status: None   Collection Time: 09/02/19 10:12 PM   Specimen: Nasopharyngeal Swab  Result Value Ref Range Status   SARS Coronavirus 2 NEGATIVE NEGATIVE Final    Comment: (NOTE) SARS-CoV-2 target nucleic acids are NOT DETECTED. The SARS-CoV-2 RNA is generally detectable in upper and lower respiratory specimens during the acute phase of infection. The lowest concentration of SARS-CoV-2 viral copies this assay can detect is 250 copies / mL. A negative result does not preclude SARS-CoV-2 infection and should not be used as the sole basis for treatment or other patient management decisions.  A negative result may occur with improper specimen collection / handling, submission of specimen other than nasopharyngeal swab, presence of viral mutation(s) within the areas targeted by this assay, and inadequate number of viral copies (<250 copies / mL). A negative result must be combined with clinical observations, patient history, and epidemiological information. Fact Sheet for Patients:   StrictlyIdeas.no Fact Sheet for Healthcare Providers: BankingDealers.co.za This test is not yet approved or cleared  by the [place] FDA and has been authorized for detection and/or diagnosis of SARS-CoV-2 by FDA under an Emergency Use Authorization (EUA).  This EUA will remain in effect (meaning this test can be used) for the duration of the COVID-19 declaration under  Section 564(b)(1) of the Act, 21 U.S.C. section 360bbb-3(b)(1), unless the authorization is terminated or revoked sooner. Performed at [facility], [address] [place], [place] [address]   MRSA PCR Screening     Status: None   Collection Time: 09/03/19  1:02 AM   Specimen: Nasopharyngeal  Result Value Ref Range Status   MRSA by PCR NEGATIVE NEGATIVE Final    Comment:        The GeneXpert MRSA Assay (FDA approved for NASAL specimens only), is one component of a comprehensive MRSA colonization surveillance program. It is not intended to diagnose MRSA infection nor to  guide or monitor treatment for MRSA infections. Performed at [facility], [address], [place], [place] [address]          Radiology Studies: No results found.      Scheduled Meds: . aspirin  81 mg Oral Daily  . atorvastatin  80 mg Oral Daily  . enoxaparin (LOVENOX) injection  40 mg Subcutaneous Q24H  . metoprolol tartrate  12.5 mg Oral BID  . sodium chloride flush  3 mL Intravenous Q12H  . ticagrelor  90 mg Oral BID   Continuous Infusions:   LOS: 5 days    Time spent: 25 minutes    Nishant Dhungel, MD [facility]   To contact the attending provider between 7A-7P or the covering provider during after hours 7P-7A, please log into the web site [email or web address] and access using universal [place] password for that web site. If you do not have the password, please call the hospital operator.  09/08/2019, 12:23 PM

## 2019-09-09 DIAGNOSIS — E43 Unspecified severe protein-calorie malnutrition: Secondary | ICD-10-CM

## 2019-09-09 LAB — POTASSIUM: Potassium: 3.6 mmol/L (ref 3.5–5.1)

## 2019-09-09 LAB — PHOSPHORUS: Phosphorus: 2.8 mg/dL (ref 2.5–4.6)

## 2019-09-09 LAB — SARS CORONAVIRUS 2 (TAT 6-24 HRS): SARS Coronavirus 2: NEGATIVE

## 2019-09-09 LAB — MAGNESIUM: Magnesium: 1.9 mg/dL (ref 1.7–2.4)

## 2019-09-09 NOTE — Plan of Care (Signed)

## 2019-09-09 NOTE — Progress Notes (Signed)
PROGRESS NOTE    Elizabeth [name]  YWV:[id number] DOB: 1928/03/07 DOA: 09/02/2019 PCP: Barbette Reichmann, MD     Chief Complaint  Patient presents with  . Fall  . Altered Mental Status    Brief Narrative:  86 year old female with history of severe dementia presented with altered mental status, syncope and poor responsiveness.  Patient was seen on her driveway on the evening of admission and then found shortly by her neighbor lying on the ground for unknown duration.  Her caregiver and sister-in-law had noticed that she had some right-sided weakness for the past 1 week. In the ED patient was found to have potassium of 3.4 and markedly elevated high sensitive troponin of 629.  EKG showed acute inferior ST elevation infarct. Patient started on aspirin, high-dose statin, Kengreal and Brilinta and taken to Cath Lab urgently and had 3 drug-eluting stent to the RCA.  Tolerated procedure well.  Assessment & Plan:   Principal problem Inferior STEMI S/p PCI x3 to RCA.  Cardiology recommends dual antiplatelet therapy for 12 months.  Currently on aspirin and Brilinta for now and possibly switch Brilinta to Plavix after 1 month. Continue Lipitor and Lopressor. 2D echo with normal EF.  Active problems Right-sided weakness Unclear duration.  MRI brain shows no acute finding with chronic small vessel ischemia and a remote bilateral cerebellar infarct.  on aspirin and statin.  PT recommends SNF.  Dementia, moderate to severe Episodes of delirium while in the hospital.  Stable past 24 hours.   Protein calorie malnutrition, severe Added nutrition supplement. Monitor potassium, magnesium and phosphorus daily and replenish given risk for refeeding syndrome.  Essential hypertension Continue current meds  Hypokalemia Continue daily potassium supplement.  Diarrhea GI pathogen panel pending. Currently resolved. Imodium as needed.  DVT prophylaxis: Subcu Lovenox Code Status: Full  code Family Communication:  Will update daughter-in-law.  Disposition:   Status is: Inpatient  Remains inpatient appropriate because:Unsafe d/c plan.  Awaiting discharge to SNF/insurance authorization.   Dispo: The patient is from: Home              Anticipated d/c is to: SNF              Anticipated d/c date is: 1 day              Patient currently is not medically stable to d/c.        Consultants:   Cardiology   Procedures: Heart catheterization, 2D echo    Subjective: Seen and examined.  Remains pleasantly confused.  Reportedly having several episodes of nonbloody diarrhea (7 in the past 24 hours).  Objective: Vitals:   09/08/19 1945 09/08/19 2100 09/09/19 0423 09/09/19 1154  BP: 113/78 113/78 120/69 (!) 90/52  Pulse: 75 71 64 (!) 59  Resp: 20  16   Temp: 98.4 F (36.9 C)  98.5 F (36.9 C) 97.7 F (36.5 C)  TempSrc: Oral  Oral   SpO2: 97%  98% 97%  Weight:      Height:       No intake or output data in the 24 hours ending 09/09/19 1508 Filed Weights   09/02/19 2043 09/03/19 0110  Weight: 49 kg 52.1 kg    Physical exam Elderly female, not in distress HEENT: Moist mucosa, supple neck Chest: Clear CVs: Normal S1-S2 GI: Soft, nondistended, nontender Musculoskeletal: Warm, no edema CNs: Alert and oriented x1  Data Reviewed: I have personally reviewed following labs and imaging studies  CBC: Recent Labs  Lab 09/02/19 2049  09/03/19 0115 09/04/19 0534  WBC 11.5* 10.2 6.9  HGB 11.3* 11.6* 11.6*  HCT 34.2* 34.8* 35.2*  MCV 89.5 89.5 88.7  PLT 282 266 257    Basic Metabolic Panel: Recent Labs  Lab 09/03/19 0115 09/03/19 0919 09/04/19 0534 09/04/19 0534 09/05/19 0526 09/06/19 0732 09/07/19 0437 09/08/19 0504 09/09/19 0456  NA   < >  --  139  --  139 138 137 136  --   K   < >  --  3.2*   < > 3.2* 3.4* 3.4* 3.4* 3.6  CL   < >  --  107  --  104 105 104 105  --   CO2   < >  --  25  --  26 25 25  21*  --   GLUCOSE   < >  --  110*  --   113* 105* 139* 110*  --   BUN   < >  --  15  --  14 19 22 20   --   CREATININE   < >  --  0.50  --  0.56 0.67 0.65 0.61  --   CALCIUM   < >  --  8.4*  --  8.3* 8.3* 8.2* 8.7*  --   MG  --  1.9 1.8  --   --   --   --  1.8 1.9  PHOS  --   --   --   --   --   --   --   --  2.8   < > = values in this interval not displayed.    GFR: Estimated Creatinine Clearance: 37 mL/min (by C-G formula based on SCr of 0.61 mg/dL).  Liver Function Tests: No results for input(s): AST, ALT, ALKPHOS, BILITOT, PROT, ALBUMIN in the last 168 hours.  CBG: Recent Labs  Lab 09/03/19 0059  GLUCAP 155*     Recent Results (from the past 240 hour(s))  SARS Coronavirus 2 by RT PCR (hospital order, performed in [facility] hospital lab) Nasopharyngeal Nasopharyngeal Swab     Status: None   Collection Time: 09/02/19 10:12 PM   Specimen: Nasopharyngeal Swab  Result Value Ref Range Status   SARS Coronavirus 2 NEGATIVE NEGATIVE Final    Comment: (NOTE) SARS-CoV-2 target nucleic acids are NOT DETECTED. The SARS-CoV-2 RNA is generally detectable in upper and lower respiratory specimens during the acute phase of infection. The lowest concentration of SARS-CoV-2 viral copies this assay can detect is 250 copies / mL. A negative result does not preclude SARS-CoV-2 infection and should not be used as the sole basis for treatment or other patient management decisions.  A negative result may occur with improper specimen collection / handling, submission of specimen other than nasopharyngeal swab, presence of viral mutation(s) within the areas targeted by this assay, and inadequate number of viral copies (<250 copies / mL). A negative result must be combined with clinical observations, patient history, and epidemiological information. Fact Sheet for Patients:   [facility] Fact Sheet for Healthcare Providers: 09/04/19 This test is not yet approved or  cleared  by the [employer] FDA and has been authorized for detection and/or diagnosis of SARS-CoV-2 by FDA under an Emergency Use Authorization (EUA).  This EUA will remain in effect (meaning this test can be used) for the duration of the COVID-19 declaration under Section 564(b)(1) of the Act, 21 U.S.C. section 360bbb-3(b)(1), unless the authorization is terminated or revoked sooner. Performed at [facility], [address]  [address] [place], [place] [address]   MRSA PCR Screening     Status: None   Collection Time: 09/03/19  1:02 AM   Specimen: Nasopharyngeal  Result Value Ref Range Status   MRSA by PCR NEGATIVE NEGATIVE Final    Comment:        The GeneXpert MRSA Assay (FDA approved for NASAL specimens only), is one component of a comprehensive MRSA colonization surveillance program. It is not intended to diagnose MRSA infection nor to guide or monitor treatment for MRSA infections. Performed at [facility], [address] [place], [place] [address]          Radiology Studies: No results found.      Scheduled Meds: . aspirin  81 mg Oral Daily  . atorvastatin  80 mg Oral Daily  . enoxaparin (LOVENOX) injection  40 mg Subcutaneous Q24H  . metoprolol tartrate  12.5 mg Oral BID  . potassium chloride  40 mEq Oral Daily  . sodium chloride flush  3 mL Intravenous Q12H  . ticagrelor  90 mg Oral BID   Continuous Infusions:   LOS: 6 days    Time spent: 25 minutes    Jobie Popp, MD [facility]   To contact the attending provider between 7A-7P or the covering provider during after hours 7P-7A, please log into the web site [email or web address] and access using universal [place] password for that web site. If you do not have the password, please call the hospital operator.  09/09/2019, 3:08 PM

## 2019-09-10 LAB — MAGNESIUM: Magnesium: 1.9 mg/dL (ref 1.7–2.4)

## 2019-09-10 LAB — POTASSIUM: Potassium: 3.7 mmol/L (ref 3.5–5.1)

## 2019-09-10 LAB — CREATININE, SERUM
Creatinine, Ser: 0.85 mg/dL (ref 0.44–1.00)
GFR calc Af Amer: >60 mL/min (ref >60)
GFR calc non Af Amer: >60 mL/min (ref >60)

## 2019-09-10 LAB — PHOSPHORUS: Phosphorus: 3.2 mg/dL (ref 2.5–4.6)

## 2019-09-10 NOTE — TOC Progression Note (Addendum)
Transition of Care [facility]) - Progression Note    Patient Details  Name: Elizabeth [name] MRN: [id number] Date of Birth: 1927/11/07  [facility] CM/SW Contact  Margarito Liner, LCSW Phone Number: 09/10/2019, 11:52 AM  Clinical Narrative:  Per SNF admissions coordinator, no updates on insurance authorization. She will call [employer] to check status.   2:18 pm: Insurance authorization still pending.  Expected Discharge Plan: Skilled Nursing Facility Barriers to Discharge: Continued Medical Work up  Expected Discharge Plan and Services Expected Discharge Plan: Skilled Nursing Facility       Living arrangements for the past 2 months: Single Family Home                                       Social Determinants of Health (SDOH) Interventions    Readmission Risk Interventions No flowsheet data found.

## 2019-09-10 NOTE — Progress Notes (Signed)
Occupational Therapy Treatment Patient Details Name: Elizabeth [name] MRN: [id number] DOB: 1927/08/21 Today's Date: 09/10/2019    History of present illness Elizabeth [name] is a 30yoF who [facility] c AMS, decreased responsiveness, syncope. PMH: dementia and R side weakness   OT comments  Elizabeth [name] presents to OT pleasantly confused, agreeable to today's intervention focused on self care.  OTR provided min assist for supine <> sit for pt to sit EOB.  Pt had poor dynamic sitting balance while seated EOB, OTR provided min assist for seated balance.  OTR provided setup assist for seated grooming tasks, including washing face and oral hygiene.  Pt's HR in 70s and SpO2 in high 90s at rest.  Pt did have elevated HR at one point while seated EOB, but this was likely 2/2 sensor malfunction as pt's HR quickly recovered to 70s after a couple minutes.  OTR assisted pt to bedlevel position with elevated HR.  Elizabeth [name] will continue to benefit from skilled OT services in acute setting to address functional strengthening, endurance, and safety and independence in ADLs.  SNF remains most appropriate discharge recommendation.   Follow Up Recommendations  SNF;Supervision/Assistance - 24 hour    Equipment Recommendations       Recommendations for Other Services      Precautions / Restrictions Precautions Precautions: Fall Restrictions Weight Bearing Restrictions: No Other Position/Activity Restrictions: cardiac monitoring       Mobility Bed Mobility Overal bed mobility: Needs Assistance Bed Mobility: Supine to Sit;Sit to Supine Rolling: Min guard   Supine to sit: Min assist        Transfers Overall transfer level: Needs assistance Equipment used: Rolling walker (2 wheeled)             General transfer comment: not assessed    Balance Overall balance assessment: Needs assistance Sitting-balance support: Feet unsupported;Single extremity supported Sitting  balance-Leahy Scale: Poor   Postural control: Left lateral lean     Standing balance comment: not tested                           ADL either performed or assessed with clinical judgement   ADL Overall ADL's : Needs assistance/impaired Eating/Feeding: Set up;Sitting   Grooming: Wash/dry face;Oral care;Sitting;Set up                                 General ADL Comments: Pt requires setup assist for seated upper body ADLs, including feeding, grooming, upper body dressing and bathing.  OOB mobility not tested 2/2 pt's HR.     Vision Patient Visual Report: No change from baseline     Perception     Praxis      Cognition Arousal/Alertness: Awake/alert Behavior During Therapy: WFL for tasks assessed/performed Overall Cognitive Status: History of cognitive impairments - at baseline Area of Impairment: Orientation;Attention;Memory;Following commands;Safety/judgement;Awareness;Problem solving                 Orientation Level: Disoriented to;Place;Time;Situation Current Attention Level: Alternating;Divided Memory: Decreased recall of precautions;Decreased short-term memory Following Commands: Follows one step commands inconsistently;Follows one step commands with increased time Safety/Judgement: Decreased awareness of safety;Decreased awareness of deficits   Problem Solving: Slow processing;Decreased initiation;Difficulty sequencing;Requires verbal cues General Comments: Pt pleasantly confused, able to follow one step commands with multiple verbal cues        Exercises Other Exercises Other Exercises: min assist  for bed mobility, setup assist for seated grooming while EOB, min assist for dynamic sitting balance   Shoulder Instructions       General Comments Pt's HR increased to high 100s while seated EOB, likely 2/2 glitch in sensor but OTR assisted pt back to bed.  HR quickly recovered to 70s, SpO2 in high 90s throughout    Pertinent  Vitals/ Pain       Pain Assessment: No/denies pain  Home Living                                          Prior Functioning/Environment              Frequency  Min 1X/week        Progress Toward Goals  OT Goals(current goals can now be found in the care plan section)  Progress towards OT goals: Progressing toward goals  Acute Rehab OT Goals Patient Stated Goal: Unable to state OT Goal Formulation: Patient unable to participate in goal setting Time For Goal Achievement: 09/19/19 Potential to Achieve Goals: Fair  Plan      Co-evaluation                 AM-PAC OT "6 Clicks" Daily Activity     Outcome Measure   Help from another person eating meals?: A Little Help from another person taking care of personal grooming?: A Little Help from another person toileting, which includes using toliet, bedpan, or urinal?: A Lot Help from another person bathing (including washing, rinsing, drying)?: A Lot Help from another person to put on and taking off regular upper body clothing?: A Little Help from another person to put on and taking off regular lower body clothing?: A Lot 6 Click Score: 15    End of Session    OT Visit Diagnosis: Unsteadiness on feet (R26.81);Other abnormalities of gait and mobility (R26.89);Muscle weakness (generalized) (M62.81)   Activity Tolerance Patient tolerated treatment well   Patient Left in bed;with call bell/phone within reach;with bed alarm set   Nurse Communication          Time: [id number] OT Time Calculation (min): 20 min  Charges: OT General Charges $OT Visit: 1 Visit OT Treatments $Self Care/Home Management : 8-22 mins  Kathyrn Drown Kadence Mimbs, OTR/L 09/10/19, 12:35 PM

## 2019-09-10 NOTE — Progress Notes (Addendum)
PROGRESS NOTE    Elizabeth [name]  SWF:[id number] DOB: August 01, 1927 DOA: 09/02/2019 PCP: Barbette Reichmann, MD  Brief Narrative:  86 year old female with history of severe dementia presented with altered mental status, syncope and poor responsiveness. Patient was seen on her driveway on the evening of admission and then found shortly by her neighbor lying on the ground for unknown duration. Her caregiver and sister-in-law had noticed that she had some right-sided weakness for the past 1 week. In the ED patient was found to have potassium of 3.4 and markedly elevated high sensitive troponin of 629. EKG showed acute inferior ST elevation infarct. Patient started on aspirin, high-dose statin, Kengreal and Brilinta and taken to Cath Lab urgently and had 3 drug-eluting stent to the RCA. Tolerated procedure well.  6/2: Seen and examined.  Resting in bed.  In no distress.  Pleasantly confused.  No pain complaints.  Pending insurance authorization for skilled nursing facility placement.   Assessment & Plan:   Principal Problem:   STEMI (ST elevation myocardial infarction) (HCC) Active Problems:   STEMI involving right coronary artery (HCC)   Protein-calorie malnutrition, severe  Principal problem Inferior STEMI S/p PCI x3 to RCA. Cardiology recommends dual antiplatelet therapy for 12 months. Currently on aspirin and Brilinta for now and possibly switch Brilinta to Plavix after 1 month. Continue Lipitor and Lopressor. 2D echo with normal EF.  Active problems Right-sided weakness Unclear duration. MRI brain shows no acute finding with chronic small vessel ischemia and a remote bilateral cerebellar infarct. on aspirin and statin. PT recommends SNF.  Pending insurance authorization  Dementia, moderate to severe Episodes of delirium while in the hospital.  Stable past 24 hours.   Protein calorie malnutrition, severe Added nutrition supplement. Monitor potassium, magnesium and  phosphorus daily and replenish given risk for refeeding syndrome.  Essential hypertension Continue current meds  Hypokalemia Continue daily potassium supplement.  Diarrhea Resolved.  Imodium as needed.  Low suspicion for infectious diarrhea.   DVT prophylaxis: Lovenox Code Status: Full Family Communication: Sister-in-law Floreen Comber [phone] on 09/10/2019 Disposition Plan: Status is: Inpatient   Remains inpatient appropriate because:Unsafe d/c plan   Dispo: The patient is from: Home              Anticipated d/c is to: SNF              Anticipated d/c date is: 1 day              Patient currently is medically stable to d/c.  Pending insurance authorization for SNF placement.  Patient is medically stable for discharge.  Consultants:   None  Procedures:   Left heart catheterization with PCI  Antimicrobials:   None   Subjective: Seen and examined.  No distress.  Pleasantly confused.  Objective: Vitals:   09/10/19 0412 09/10/19 0928 09/10/19 0949 09/10/19 1159  BP: 116/72 114/73 112/60 120/66  Pulse: (!) 59 66 68 66  Resp: 16 14  14   Temp: (!) 97.5 F (36.4 C) 97.6 F (36.4 C)  98 F (36.7 C)  TempSrc: Oral Oral  Oral  SpO2: 100% 96% 99% 97%  Weight:      Height:        Intake/Output Summary (Last 24 hours) at 09/10/2019 1505 Last data filed at 09/10/2019 1041 Gross per 24 hour  Intake 480 ml  Output 0 ml  Net 480 ml   Filed Weights   09/02/19 2043 09/03/19 0110  Weight: 49 kg 52.1 kg    Examination:  General exam: Appears calm, pleasantly confused Respiratory system: Clear to auscultation. Respiratory effort normal. Cardiovascular system: S1 & S2 heard, RRR. No JVD, murmurs, rubs, gallops or clicks. No pedal edema. Gastrointestinal system: Abdomen is nondistended, soft and nontender. No organomegaly or masses felt. Normal bowel sounds heard. Central nervous system: Alert and oriented. No focal neurological deficits. Extremities: Symmetric 5  x 5 power. Skin: No rashes, lesions or ulcers Psychiatry: Judgement and insight appear normal. Mood & affect appropriate.     Data Reviewed: I have personally reviewed following labs and imaging studies  CBC: Recent Labs  Lab 09/04/19 0534  WBC 6.9  HGB 11.6*  HCT 35.2*  MCV 88.7  PLT 257   Basic Metabolic Panel: Recent Labs  Lab 09/04/19 0534 09/04/19 0534 09/05/19 0526 09/05/19 0526 09/06/19 0732 09/07/19 0437 09/08/19 0504 09/09/19 0456 09/10/19 0506  NA 139  --  139  --  138 137 136  --   --   K 3.2*   < > 3.2*   < > 3.4* 3.4* 3.4* 3.6 3.7  CL 107  --  104  --  105 104 105  --   --   CO2 25  --  26  --  25 25 21*  --   --   GLUCOSE 110*  --  113*  --  105* 139* 110*  --   --   BUN 15  --  14  --  19 22 20   --   --   CREATININE 0.50   < > 0.56  --  0.67 0.65 0.61  --  0.85  CALCIUM 8.4*  --  8.3*  --  8.3* 8.2* 8.7*  --   --   MG 1.8  --   --   --   --   --  1.8 1.9 1.9  PHOS  --   --   --   --   --   --   --  2.8 3.2   < > = values in this interval not displayed.   GFR: Estimated Creatinine Clearance: 34.8 mL/min (by C-G formula based on SCr of 0.85 mg/dL). Liver Function Tests: No results for input(s): AST, ALT, ALKPHOS, BILITOT, PROT, ALBUMIN in the last 168 hours. No results for input(s): LIPASE, AMYLASE in the last 168 hours. No results for input(s): AMMONIA in the last 168 hours. Coagulation Profile: No results for input(s): INR, PROTIME in the last 168 hours. Cardiac Enzymes: No results for input(s): CKTOTAL, CKMB, CKMBINDEX, TROPONINI in the last 168 hours. BNP (last 3 results) No results for input(s): PROBNP in the last 8760 hours. HbA1C: No results for input(s): HGBA1C in the last 72 hours. CBG: No results for input(s): GLUCAP in the last 168 hours. Lipid Profile: No results for input(s): CHOL, HDL, LDLCALC, TRIG, CHOLHDL, LDLDIRECT in the last 72 hours. Thyroid Function Tests: No results for input(s): TSH, T4TOTAL, FREET4, T3FREE, THYROIDAB  in the last 72 hours. Anemia Panel: No results for input(s): VITAMINB12, FOLATE, FERRITIN, TIBC, IRON, RETICCTPCT in the last 72 hours. Sepsis Labs: No results for input(s): PROCALCITON, LATICACIDVEN in the last 168 hours.  Recent Results (from the past 240 hour(s))  SARS Coronavirus 2 by RT PCR (hospital order, performed in [facility] hospital lab) Nasopharyngeal Nasopharyngeal Swab     Status: None   Collection Time: 09/02/19 10:12 PM   Specimen: Nasopharyngeal Swab  Result Value Ref Range Status   SARS Coronavirus 2 NEGATIVE NEGATIVE Final    Comment: (NOTE)  SARS-CoV-2 target nucleic acids are NOT DETECTED. The SARS-CoV-2 RNA is generally detectable in upper and lower respiratory specimens during the acute phase of infection. The lowest concentration of SARS-CoV-2 viral copies this assay can detect is 250 copies / mL. A negative result does not preclude SARS-CoV-2 infection and should not be used as the sole basis for treatment or other patient management decisions.  A negative result may occur with improper specimen collection / handling, submission of specimen other than nasopharyngeal swab, presence of viral mutation(s) within the areas targeted by this assay, and inadequate number of viral copies (<250 copies / mL). A negative result must be combined with clinical observations, patient history, and epidemiological information. Fact Sheet for Patients:   StrictlyIdeas.no Fact Sheet for Healthcare Providers: BankingDealers.co.za This test is not yet approved or cleared  by the [place] FDA and has been authorized for detection and/or diagnosis of SARS-CoV-2 by FDA under an Emergency Use Authorization (EUA).  This EUA will remain in effect (meaning this test can be used) for the duration of the COVID-19 declaration under Section 564(b)(1) of the Act, 21 U.S.C. section 360bbb-3(b)(1), unless the authorization is terminated  or revoked sooner. Performed at [facility], [address] [place], [place] [address]   MRSA PCR Screening     Status: None   Collection Time: 09/03/19  1:02 AM   Specimen: Nasopharyngeal  Result Value Ref Range Status   MRSA by PCR NEGATIVE NEGATIVE Final    Comment:        The GeneXpert MRSA Assay (FDA approved for NASAL specimens only), is one component of a comprehensive MRSA colonization surveillance program. It is not intended to diagnose MRSA infection nor to guide or monitor treatment for MRSA infections. Performed at [facility], [place], [place] [address]   SARS CORONAVIRUS 2 (TAT 6-24 HRS) Nasopharyngeal Nasopharyngeal Swab     Status: None   Collection Time: 09/09/19 12:45 PM   Specimen: Nasopharyngeal Swab  Result Value Ref Range Status   SARS Coronavirus 2 NEGATIVE NEGATIVE Final    Comment: (NOTE) SARS-CoV-2 target nucleic acids are NOT DETECTED. The SARS-CoV-2 RNA is generally detectable in upper and lower respiratory specimens during the acute phase of infection. Negative results do not preclude SARS-CoV-2 infection, do not rule out co-infections with other pathogens, and should not be used as the sole basis for treatment or other patient management decisions. Negative results must be combined with clinical observations, patient history, and epidemiological information. The expected result is Negative. Fact Sheet for Patients: SugarRoll.be Fact Sheet for Healthcare Providers: https://www.woods-mathews.com/ This test is not yet approved or cleared by the [place] FDA and  has been authorized for detection and/or diagnosis of SARS-CoV-2 by FDA under an Emergency Use Authorization (EUA). This EUA will remain  in effect (meaning this test can be used) for the duration of the COVID-19 declaration under Section 56 4(b)(1) of the Act, 21 U.S.C. section 360bbb-3(b)(1), unless  the authorization is terminated or revoked sooner. Performed at [facility], [place] [address], [place], [place] [address]          Radiology Studies: No results found.      Scheduled Meds: . aspirin  81 mg Oral Daily  . atorvastatin  80 mg Oral Daily  . enoxaparin (LOVENOX) injection  40 mg Subcutaneous Q24H  . metoprolol tartrate  12.5 mg Oral BID  . potassium chloride  40 mEq Oral Daily  . sodium chloride flush  3 mL Intravenous Q12H  . ticagrelor  90 mg Oral BID   Continuous Infusions:   LOS: 7 days    Time spent: 35 minutes    Tresa Moore, MD [facility] Pager [phone]  If 7PM-7AM, please contact night-coverage 09/10/2019, 3:05 PM

## 2019-09-10 NOTE — Progress Notes (Signed)
PT Cancellation Note  Patient Details Name: Elizabeth [name] MRN: [id number] DOB: 21-Jan-1928   Cancelled Treatment:     PT attempt. Pt was awake upon arriving and agreeable to PT session however sitting in large/loose BM. RN staff notified. Therapist will return at later time/date and continue to follow current POC.    Rushie Chestnut 09/10/2019, 4:14 PM

## 2019-09-11 LAB — MAGNESIUM: Magnesium: 1.8 mg/dL (ref 1.7–2.4)

## 2019-09-11 LAB — POTASSIUM: Potassium: 4 mmol/L (ref 3.5–5.1)

## 2019-09-11 LAB — PHOSPHORUS: Phosphorus: 3.9 mg/dL (ref 2.5–4.6)

## 2019-09-11 NOTE — Care Management Important Message (Signed)
Important Message  Patient Details  Name: Elizabeth [name] MRN: [id number] Date of Birth: 05/10/1927   Medicare Important Message Given:  Yes     Johnell Comings 09/11/2019, 2:52 PM

## 2019-09-11 NOTE — Progress Notes (Signed)
PROGRESS NOTE    Elizabeth [name]  IRJ:[id number] DOB: Jan 09, 1928 DOA: 09/02/2019 PCP: Barbette Reichmann, MD  Brief Narrative:  86 year old female with history of severe dementia presented with altered mental status, syncope and poor responsiveness. Patient was seen on her driveway on the evening of admission and then found shortly by her neighbor lying on the ground for unknown duration. Her caregiver and sister-in-law had noticed that she had some right-sided weakness for the past 1 week. In the ED patient was found to have potassium of 3.4 and markedly elevated high sensitive troponin of 629. EKG showed acute inferior ST elevation infarct. Patient started on aspirin, high-dose statin, Kengreal and Brilinta and taken to Cath Lab urgently and had 3 drug-eluting stent to the RCA. Tolerated procedure well.  6/2: Seen and examined.  Resting in bed.  In no distress.  Pleasantly confused.  No pain complaints.  Pending insurance authorization for skilled nursing facility placement.  6/3: Patient seen and examined.  Sitting up in bed.  Eating.  Remains pleasantly demented.   Assessment & Plan:   Principal Problem:   STEMI (ST elevation myocardial infarction) (HCC) Active Problems:   STEMI involving right coronary artery (HCC)   Protein-calorie malnutrition, severe  Principal problem Inferior STEMI S/p PCI x3 to RCA. Cardiology recommends dual antiplatelet therapy for 12 months. Currently on aspirin and Brilinta for now and possibly switch Brilinta to Plavix after 1 month. Continue Lipitor and Lopressor. 2D echo with normal EF.  Active problems Right-sided weakness Unclear duration. MRI brain shows no acute finding with chronic small vessel ischemia and a remote bilateral cerebellar infarct. on aspirin and statin. PT recommends SNF.  Pending insurance authorization  Dementia, moderate to severe Episodes of delirium while in the hospital.  Stable past 24 hours.    Protein calorie malnutrition, severe Added nutrition supplement. Monitor potassium, magnesium and phosphorus daily and replenish given risk for refeeding syndrome.  Essential hypertension Continue current meds  Hypokalemia Continue daily potassium supplement.  Diarrhea Resolved.  Imodium as needed.  Low suspicion for infectious diarrhea.   DVT prophylaxis: Lovenox Code Status: Full Family Communication: Sister-in-law Floreen Comber [phone] on 09/10/2019 Disposition Plan: Status is: Inpatient   Remains inpatient appropriate because:Unsafe d/c plan   Dispo: The patient is from: Home              Anticipated d/c is to: SNF              Anticipated d/c date is: 1 day              Patient currently is medically stable to d/c.  Pending insurance authorization for SNF placement.  Patient is medically stable for discharge.  Consultants:   None  Procedures:   Left heart catheterization with PCI  Antimicrobials:   None   Subjective: Seen and examined.  No distress.  Pleasantly confused.  Objective: Vitals:   09/10/19 1159 09/10/19 1956 09/10/19 2137 09/11/19 0524  BP: 120/66 104/85 112/68 109/69  Pulse: 66 96 77 67  Resp: 14 18  16   Temp: 98 F (36.7 C) 98.2 F (36.8 C)  97.8 F (36.6 C)  TempSrc: Oral Oral  Oral  SpO2: 97% 99%  100%  Weight:      Height:        Intake/Output Summary (Last 24 hours) at 09/11/2019 1247 Last data filed at 09/11/2019 0900 Gross per 24 hour  Intake 418 ml  Output -  Net 418 ml   11/11/2019  09/02/19 2043 09/03/19 0110  Weight: 49 kg 52.1 kg    Examination:  General exam: Appears calm, pleasantly confused Respiratory system: Clear to auscultation. Respiratory effort normal. Cardiovascular system: S1 & S2 heard, RRR. No JVD, murmurs, rubs, gallops or clicks. No pedal edema. Gastrointestinal system: Abdomen is nondistended, soft and nontender. No organomegaly or masses felt. Normal bowel sounds heard. Central nervous  system: Alert and oriented. No focal neurological deficits. Extremities: Symmetric 5 x 5 power. Skin: No rashes, lesions or ulcers Psychiatry: Judgement and insight appear normal. Mood & affect appropriate.     Data Reviewed: I have personally reviewed following labs and imaging studies  CBC: No results for input(s): WBC, NEUTROABS, HGB, HCT, MCV, PLT in the last 168 hours. Basic Metabolic Panel: Recent Labs  Lab 09/05/19 0526 09/05/19 0526 09/06/19 0732 09/06/19 0732 09/07/19 0437 09/08/19 0504 09/09/19 0456 09/10/19 0506 09/11/19 0550  NA 139  --  138  --  137 136  --   --   --   K 3.2*   < > 3.4*   < > 3.4* 3.4* 3.6 3.7 4.0  CL 104  --  105  --  104 105  --   --   --   CO2 26  --  25  --  25 21*  --   --   --   GLUCOSE 113*  --  105*  --  139* 110*  --   --   --   BUN 14  --  19  --  22 20  --   --   --   CREATININE 0.56  --  0.67  --  0.65 0.61  --  0.85  --   CALCIUM 8.3*  --  8.3*  --  8.2* 8.7*  --   --   --   MG  --   --   --   --   --  1.8 1.9 1.9 1.8  PHOS  --   --   --   --   --   --  2.8 3.2 3.9   < > = values in this interval not displayed.   GFR: Estimated Creatinine Clearance: 34.8 mL/min (by C-G formula based on SCr of 0.85 mg/dL). Liver Function Tests: No results for input(s): AST, ALT, ALKPHOS, BILITOT, PROT, ALBUMIN in the last 168 hours. No results for input(s): LIPASE, AMYLASE in the last 168 hours. No results for input(s): AMMONIA in the last 168 hours. Coagulation Profile: No results for input(s): INR, PROTIME in the last 168 hours. Cardiac Enzymes: No results for input(s): CKTOTAL, CKMB, CKMBINDEX, TROPONINI in the last 168 hours. BNP (last 3 results) No results for input(s): PROBNP in the last 8760 hours. HbA1C: No results for input(s): HGBA1C in the last 72 hours. CBG: No results for input(s): GLUCAP in the last 168 hours. Lipid Profile: No results for input(s): CHOL, HDL, LDLCALC, TRIG, CHOLHDL, LDLDIRECT in the last 72 hours. Thyroid  Function Tests: No results for input(s): TSH, T4TOTAL, FREET4, T3FREE, THYROIDAB in the last 72 hours. Anemia Panel: No results for input(s): VITAMINB12, FOLATE, FERRITIN, TIBC, IRON, RETICCTPCT in the last 72 hours. Sepsis Labs: No results for input(s): PROCALCITON, LATICACIDVEN in the last 168 hours.  Recent Results (from the past 240 hour(s))  SARS Coronavirus 2 by RT PCR (hospital order, performed in [facility] hospital lab) Nasopharyngeal Nasopharyngeal Swab     Status: None   Collection Time: 09/02/19 10:12 PM   Specimen: Nasopharyngeal Swab  Result Value Ref Range Status   SARS Coronavirus 2 NEGATIVE NEGATIVE Final    Comment: (NOTE) SARS-CoV-2 target nucleic acids are NOT DETECTED. The SARS-CoV-2 RNA is generally detectable in upper and lower respiratory specimens during the acute phase of infection. The lowest concentration of SARS-CoV-2 viral copies this assay can detect is 250 copies / mL. A negative result does not preclude SARS-CoV-2 infection and should not be used as the sole basis for treatment or other patient management decisions.  A negative result may occur with improper specimen collection / handling, submission of specimen other than nasopharyngeal swab, presence of viral mutation(s) within the areas targeted by this assay, and inadequate number of viral copies (<250 copies / mL). A negative result must be combined with clinical observations, patient history, and epidemiological information. Fact Sheet for Patients:   StrictlyIdeas.no Fact Sheet for Healthcare Providers: BankingDealers.co.za This test is not yet approved or cleared  by the [place] FDA and has been authorized for detection and/or diagnosis of SARS-CoV-2 by FDA under an Emergency Use Authorization (EUA).  This EUA will remain in effect (meaning this test can be used) for the duration of the COVID-19 declaration under Section 564(b)(1) of the  Act, 21 U.S.C. section 360bbb-3(b)(1), unless the authorization is terminated or revoked sooner. Performed at [facility], [address] [place], [place] [address]   MRSA PCR Screening     Status: None   Collection Time: 09/03/19  1:02 AM   Specimen: Nasopharyngeal  Result Value Ref Range Status   MRSA by PCR NEGATIVE NEGATIVE Final    Comment:        The GeneXpert MRSA Assay (FDA approved for NASAL specimens only), is one component of a comprehensive MRSA colonization surveillance program. It is not intended to diagnose MRSA infection nor to guide or monitor treatment for MRSA infections. Performed at [facility], [place], [place] [address]   SARS CORONAVIRUS 2 (TAT 6-24 HRS) Nasopharyngeal Nasopharyngeal Swab     Status: None   Collection Time: 09/09/19 12:45 PM   Specimen: Nasopharyngeal Swab  Result Value Ref Range Status   SARS Coronavirus 2 NEGATIVE NEGATIVE Final    Comment: (NOTE) SARS-CoV-2 target nucleic acids are NOT DETECTED. The SARS-CoV-2 RNA is generally detectable in upper and lower respiratory specimens during the acute phase of infection. Negative results do not preclude SARS-CoV-2 infection, do not rule out co-infections with other pathogens, and should not be used as the sole basis for treatment or other patient management decisions. Negative results must be combined with clinical observations, patient history, and epidemiological information. The expected result is Negative. Fact Sheet for Patients: SugarRoll.be Fact Sheet for Healthcare Providers: https://www.woods-mathews.com/ This test is not yet approved or cleared by the [place] FDA and  has been authorized for detection and/or diagnosis of SARS-CoV-2 by FDA under an Emergency Use Authorization (EUA). This EUA will remain  in effect (meaning this test can be used) for the duration of the COVID-19 declaration  under Section 56 4(b)(1) of the Act, 21 U.S.C. section 360bbb-3(b)(1), unless the authorization is terminated or revoked sooner. Performed at [facility], [place] [address], [place], [place] [address]          Radiology Studies: No results found.      Scheduled Meds: . aspirin  81 mg Oral Daily  . atorvastatin  80 mg Oral Daily  . enoxaparin (LOVENOX) injection  40 mg Subcutaneous Q24H  . metoprolol tartrate  12.5  mg Oral BID  . potassium chloride  40 mEq Oral Daily  . sodium chloride flush  3 mL Intravenous Q12H  . ticagrelor  90 mg Oral BID   Continuous Infusions:   LOS: 8 days    Time spent: 35 minutes    Tresa Moore, MD [facility] Pager [phone]  If 7PM-7AM, please contact night-coverage 09/11/2019, 12:47 PM

## 2019-09-11 NOTE — Progress Notes (Signed)
Physical Therapy Treatment Patient Details Name: Elizabeth [name] MRN: [id number] DOB: 10-27-1927 Today's Date: 09/11/2019    History of Present Illness Elizabeth [name] is a 38yoF who comes [facility] on 5/[facility] c AMS, decreased responsiveness, syncope. PMH: dementia and R side weakness    PT Comments    Pt was awake in bed upon arriving. She greets therapist and is pleasant throughout. Pt is confused and disoriented however was able to follow commands consistently throughout. Pt agreeable to OOB activity. Was able to exit L side of bed with increased time + min assist for safety. vcs throughout for improved technique and sequencing. Stood to [employer] with min assist. Ambulated to doorway of room and back to bed 2 x prior to reporting fatigue and requesting seated rest. Min assist required to return to supine and be repositioned in bed. Overall pt is progressing well but will benefit from SNF at DC to address strength, balance, and safe functional mobility deficits. Pt was in bed, with bed alarm in place, and call bell in reach.     Follow Up Recommendations  SNF;Supervision/Assistance - 24 hour;Supervision for mobility/OOB     Equipment Recommendations  Rolling walker with 5" wheels    Recommendations for Other Services       Precautions / Restrictions Precautions Precautions: Fall Precaution Comments: RN reports intermittently combative / agitated  Restrictions Weight Bearing Restrictions: No Other Position/Activity Restrictions: cardiac monitoring    Mobility  Bed Mobility Overal bed mobility: Needs Assistance Bed Mobility: Supine to Sit;Sit to Supine     Supine to sit: Min assist Sit to supine: Min assist   General bed mobility comments: Pt was able to exit L sid eof bed with increasd time and min assist. Vcs for technique and sequencing throughout  Transfers Overall transfer level: Needs assistance Equipment used: Rolling walker (2 wheeled) Transfers: Sit to/from  Stand Sit to Stand: Min assist;From elevated surface         General transfer comment: min assist to safely STS from EOB to RW  Ambulation/Gait Ambulation/Gait assistance: Min assist Gait Distance (Feet): 20 Feet Assistive device: Rolling walker (2 wheeled) Gait Pattern/deviations: Step-through pattern;Decreased step length - right;Decreased step length - left;Trunk flexed Gait velocity: decreased   General Gait Details: to door and back x 2 with seated rest for minimal fatigue   Stairs             Wheelchair Mobility    Modified Rankin (Stroke Patients Only)       Balance Overall balance assessment: Needs assistance Sitting-balance support: Feet unsupported;Single extremity supported Sitting balance-Leahy Scale: Fair Sitting balance - Comments: no LOB seated EOB   Standing balance support: Bilateral upper extremity supported;During functional activity Standing balance-Leahy Scale: Fair Standing balance comment: pt does have a little unsteadiness with BUE support during ambulation                            Cognition Arousal/Alertness: Awake/alert Behavior During Therapy: WFL for tasks assessed/performed Overall Cognitive Status: History of cognitive impairments - at baseline Area of Impairment: Orientation;Attention;Memory;Following commands;Safety/judgement;Awareness;Problem solving                 Orientation Level: Disoriented to;Place;Time;Situation Current Attention Level: Alternating;Divided Memory: Decreased recall of precautions;Decreased short-term memory Following Commands: Follows one step commands inconsistently;Follows one step commands with increased time Safety/Judgement: Decreased awareness of safety;Decreased awareness of deficits   Problem Solving: Slow processing;Decreased initiation;Difficulty sequencing;Requires verbal cues General Comments:  Pt pleasantly confused, able to follow one step commands with multiple verbal  cues      Exercises      General Comments        Pertinent Vitals/Pain      Home Living                      Prior Function            PT Goals (current goals can now be found in the care plan section) Acute Rehab PT Goals Patient Stated Goal: Unable to state Progress towards PT goals: Progressing toward goals    Frequency    Min 2X/week      PT Plan Current plan remains appropriate    Co-evaluation              AM-PAC PT "6 Clicks" Mobility   Outcome Measure  Help needed turning from your back to your side while in a flat bed without using bedrails?: A Little Help needed moving from lying on your back to sitting on the side of a flat bed without using bedrails?: A Little Help needed moving to and from a bed to a chair (including a wheelchair)?: A Little Help needed standing up from a chair using your arms (e.g., wheelchair or bedside chair)?: A Little Help needed to walk in hospital room?: A Little Help needed climbing 3-5 steps with a railing? : A Lot 6 Click Score: 17    End of Session Equipment Utilized During Treatment: Gait belt Activity Tolerance: Patient tolerated treatment well Patient left: in bed;with call bell/phone within reach;with bed alarm set Nurse Communication: Mobility status PT Visit Diagnosis: Unsteadiness on feet (R26.81);Difficulty in walking, not elsewhere classified (R26.2);Other abnormalities of gait and mobility (R26.89);Muscle weakness (generalized) (M62.81)     Time: 1047-1100 PT Time Calculation (min) (ACUTE ONLY): 13 min  Charges:  $Therapeutic Activity: 8-22 mins                     Jetta Lout PTA 09/11/19, 12:39 PM

## 2019-09-11 NOTE — TOC Progression Note (Addendum)
Transition of Care [facility]) - Progression Note    Patient Details  Name: Elizabeth [name] MRN: [id number] Date of Birth: [birth year]  [facility] CM/SW Contact  Margarito Liner, LCSW Phone Number: 09/11/2019, 10:11 AM  Clinical Narrative: Insurance authorization is still pending.    11:38 am: Called and updated patient's HCPOA. Patient has not had her COVID vaccines.  Expected Discharge Plan: Skilled Nursing Facility Barriers to Discharge: Continued Medical Work up  Expected Discharge Plan and Services Expected Discharge Plan: Skilled Nursing Facility       Living arrangements for the past 2 months: Single Family Home                                       Social Determinants of Health (SDOH) Interventions    Readmission Risk Interventions No flowsheet data found.

## 2019-09-12 LAB — POTASSIUM: Potassium: 3.9 mmol/L (ref 3.5–5.1)

## 2019-09-12 LAB — PHOSPHORUS: Phosphorus: 3.8 mg/dL (ref 2.5–4.6)

## 2019-09-12 LAB — MAGNESIUM: Magnesium: 1.6 mg/dL — ABNORMAL LOW (ref 1.7–2.4)

## 2019-09-12 MED ORDER — MAGNESIUM SULFATE 2 GM/50ML IV SOLN
2.0000 g | Freq: Once | INTRAVENOUS | Status: AC
  Administered 2019-09-12: 2 g via INTRAVENOUS
  Filled 2019-09-12: qty 50

## 2019-09-12 NOTE — Progress Notes (Signed)
Cliffton Asters NP notified pt's bp 97/81, HR 69, instructed to hold med.

## 2019-09-12 NOTE — Progress Notes (Signed)
PROGRESS NOTE    Elizabeth [name]  EXN:[id number] DOB: Aug 28, 1927 DOA: 09/02/2019 PCP: Barbette Reichmann, MD  Brief Narrative:  86 year old female with history of severe dementia presented with altered mental status, syncope and poor responsiveness. Patient was seen on her driveway on the evening of admission and then found shortly by her neighbor lying on the ground for unknown duration. Her caregiver and sister-in-law had noticed that she had some right-sided weakness for the past 1 week. In the ED patient was found to have potassium of 3.4 and markedly elevated high sensitive troponin of 629. EKG showed acute inferior ST elevation infarct. Patient started on aspirin, high-dose statin, Kengreal and Brilinta and taken to Cath Lab urgently and had 3 drug-eluting stent to the RCA. Tolerated procedure well.  6/2: Seen and examined.  Resting in bed.  In no distress.  Pleasantly confused.  No pain complaints.  Pending insurance authorization for skilled nursing facility placement.  6/3: Patient seen and examined.  Sitting up in bed.  Eating.  Remains pleasantly demented.  6/4: Patient seen and examined.  Sitting up in bed eating.  Assistance by patient care tech.  In no acute distress.  Pending insurance authorization for skilled nursing facility.    Assessment & Plan:   Principal Problem:   STEMI (ST elevation myocardial infarction) (HCC) Active Problems:   STEMI involving right coronary artery (HCC)   Protein-calorie malnutrition, severe  Principal problem Inferior STEMI S/p PCI x3 to RCA. Cardiology recommends dual antiplatelet therapy for 12 months. Currently on aspirin and Brilinta for now and possibly switch Brilinta to Plavix after 1 month. Continue Lipitor and Lopressor. 2D echo with normal EF.  Active problems Right-sided weakness Unclear duration. MRI brain shows no acute finding with chronic small vessel ischemia and a remote bilateral cerebellar infarct. on  aspirin and statin. PT recommends SNF.  Pending insurance authorization  Dementia, moderate to severe Episodes of delirium while in the hospital.  Stable past 24 hours.   Protein calorie malnutrition, severe Added nutrition supplement. Monitor potassium, magnesium and phosphorus daily and replenish given risk for refeeding syndrome.  Essential hypertension Continue current meds  Hypokalemia Continue daily potassium supplement.  Diarrhea Resolved.  Imodium as needed.  Low suspicion for infectious diarrhea.   DVT prophylaxis: Lovenox Code Status: Full Family Communication: Sister-in-law Floreen Comber [phone] on 09/10/2019 Disposition Plan: Status is: Inpatient   Remains inpatient appropriate because:Unsafe d/c plan   Dispo: The patient is from: Home              Anticipated d/c is to: SNF              Anticipated d/c date is: 1 day              Patient currently is medically stable to d/c.  Pending insurance authorization for SNF placement.   Patient is medically stable for discharge.  Consultants:   None  Procedures:   Left heart catheterization with PCI  Antimicrobials:   None   Subjective: Seen and examined.  No distress.  Pleasantly confused.  Objective: Vitals:   09/11/19 1312 09/11/19 2024 09/12/19 0442 09/12/19 1157  BP: 107/69 129/77 110/69 107/61  Pulse: 66 73 74 64  Resp: 20 16 16 14   Temp: 98.1 F (36.7 C) 98.2 F (36.8 C) 98.6 F (37 C) 98.7 F (37.1 C)  TempSrc: Oral Oral Oral Oral  SpO2: 100% 96% 97% 97%  Weight:      Height:  Intake/Output Summary (Last 24 hours) at 09/12/2019 1313 Last data filed at 09/12/2019 0900 Gross per 24 hour  Intake 285 ml  Output 375 ml  Net -90 ml   Filed Weights   09/02/19 2043 09/03/19 0110  Weight: 49 kg 52.1 kg    Examination:  General exam: Appears calm, pleasantly confused Respiratory system: Clear to auscultation. Respiratory effort normal. Cardiovascular system: S1 & S2  heard, RRR. No JVD, murmurs, rubs, gallops or clicks. No pedal edema. Gastrointestinal system: Abdomen is nondistended, soft and nontender. No organomegaly or masses felt. Normal bowel sounds heard. Central nervous system: Alert and oriented. No focal neurological deficits. Extremities: Symmetric 5 x 5 power. Skin: No rashes, lesions or ulcers Psychiatry: Judgement and insight appear normal. Mood & affect appropriate.     Data Reviewed: I have personally reviewed following labs and imaging studies  CBC: No results for input(s): WBC, NEUTROABS, HGB, HCT, MCV, PLT in the last 168 hours. Basic Metabolic Panel: Recent Labs  Lab 09/06/19 0732 09/06/19 0732 09/07/19 6195 09/07/19 0437 09/08/19 0504 09/09/19 0456 09/10/19 0506 09/11/19 0550 09/12/19 0423  NA 138  --  137  --  136  --   --   --   --   K 3.4*   < > 3.4*   < > 3.4* 3.6 3.7 4.0 3.9  CL 105  --  104  --  105  --   --   --   --   CO2 25  --  25  --  21*  --   --   --   --   GLUCOSE 105*  --  139*  --  110*  --   --   --   --   BUN 19  --  22  --  20  --   --   --   --   CREATININE 0.67  --  0.65  --  0.61  --  0.85  --   --   CALCIUM 8.3*  --  8.2*  --  8.7*  --   --   --   --   MG  --   --   --   --  1.8 1.9 1.9 1.8 1.6*  PHOS  --   --   --   --   --  2.8 3.2 3.9 3.8   < > = values in this interval not displayed.   GFR: Estimated Creatinine Clearance: 34.8 mL/min (by C-G formula based on SCr of 0.85 mg/dL). Liver Function Tests: No results for input(s): AST, ALT, ALKPHOS, BILITOT, PROT, ALBUMIN in the last 168 hours. No results for input(s): LIPASE, AMYLASE in the last 168 hours. No results for input(s): AMMONIA in the last 168 hours. Coagulation Profile: No results for input(s): INR, PROTIME in the last 168 hours. Cardiac Enzymes: No results for input(s): CKTOTAL, CKMB, CKMBINDEX, TROPONINI in the last 168 hours. BNP (last 3 results) No results for input(s): PROBNP in the last 8760 hours. HbA1C: No results for  input(s): HGBA1C in the last 72 hours. CBG: No results for input(s): GLUCAP in the last 168 hours. Lipid Profile: No results for input(s): CHOL, HDL, LDLCALC, TRIG, CHOLHDL, LDLDIRECT in the last 72 hours. Thyroid Function Tests: No results for input(s): TSH, T4TOTAL, FREET4, T3FREE, THYROIDAB in the last 72 hours. Anemia Panel: No results for input(s): VITAMINB12, FOLATE, FERRITIN, TIBC, IRON, RETICCTPCT in the last 72 hours. Sepsis Labs: No results for input(s): PROCALCITON, LATICACIDVEN in the last 168  hours.  Recent Results (from the past 240 hour(s))  SARS Coronavirus 2 by RT PCR (hospital order, performed in [facility] hospital lab) Nasopharyngeal Nasopharyngeal Swab     Status: None   Collection Time: 09/02/19 10:12 PM   Specimen: Nasopharyngeal Swab  Result Value Ref Range Status   SARS Coronavirus 2 NEGATIVE NEGATIVE Final    Comment: (NOTE) SARS-CoV-2 target nucleic acids are NOT DETECTED. The SARS-CoV-2 RNA is generally detectable in upper and lower respiratory specimens during the acute phase of infection. The lowest concentration of SARS-CoV-2 viral copies this assay can detect is 250 copies / mL. A negative result does not preclude SARS-CoV-2 infection and should not be used as the sole basis for treatment or other patient management decisions.  A negative result may occur with improper specimen collection / handling, submission of specimen other than nasopharyngeal swab, presence of viral mutation(s) within the areas targeted by this assay, and inadequate number of viral copies (<250 copies / mL). A negative result must be combined with clinical observations, patient history, and epidemiological information. Fact Sheet for Patients:   BoilerBrush.com.cy Fact Sheet for Healthcare Providers: https://pope.com/ This test is not yet approved or cleared  by the [place] FDA and has been authorized for detection and/or  diagnosis of SARS-CoV-2 by FDA under an Emergency Use Authorization (EUA).  This EUA will remain in effect (meaning this test can be used) for the duration of the COVID-19 declaration under Section 564(b)(1) of the Act, 21 U.S.C. section 360bbb-3(b)(1), unless the authorization is terminated or revoked sooner. Performed at [facility], [address] [place], [place] [address]   MRSA PCR Screening     Status: None   Collection Time: 09/03/19  1:02 AM   Specimen: Nasopharyngeal  Result Value Ref Range Status   MRSA by PCR NEGATIVE NEGATIVE Final    Comment:        The GeneXpert MRSA Assay (FDA approved for NASAL specimens only), is one component of a comprehensive MRSA colonization surveillance program. It is not intended to diagnose MRSA infection nor to guide or monitor treatment for MRSA infections. Performed at [facility], [address] [place], [place] [address]   SARS CORONAVIRUS 2 (TAT 6-24 HRS) Nasopharyngeal Nasopharyngeal Swab     Status: None   Collection Time: 09/09/19 12:45 PM   Specimen: Nasopharyngeal Swab  Result Value Ref Range Status   SARS Coronavirus 2 NEGATIVE NEGATIVE Final    Comment: (NOTE) SARS-CoV-2 target nucleic acids are NOT DETECTED. The SARS-CoV-2 RNA is generally detectable in upper and lower respiratory specimens during the acute phase of infection. Negative results do not preclude SARS-CoV-2 infection, do not rule out co-infections with other pathogens, and should not be used as the sole basis for treatment or other patient management decisions. Negative results must be combined with clinical observations, patient history, and epidemiological information. The expected result is Negative. Fact Sheet for Patients: HairSlick.no Fact Sheet for Healthcare Providers: quierodirigir.com This test is not yet approved or cleared by the [place] FDA and  has been  authorized for detection and/or diagnosis of SARS-CoV-2 by FDA under an Emergency Use Authorization (EUA). This EUA will remain  in effect (meaning this test can be used) for the duration of the COVID-19 declaration under Section 56 4(b)(1) of the Act, 21 U.S.C. section 360bbb-3(b)(1), unless the authorization is terminated or revoked sooner. Performed at [facility], [address], [place], [place] [address]  Radiology Studies: No results found.      Scheduled Meds: . aspirin  81 mg Oral Daily  . atorvastatin  80 mg Oral Daily  . enoxaparin (LOVENOX) injection  40 mg Subcutaneous Q24H  . metoprolol tartrate  12.5 mg Oral BID  . potassium chloride  40 mEq Oral Daily  . sodium chloride flush  3 mL Intravenous Q12H  . ticagrelor  90 mg Oral BID   Continuous Infusions:   LOS: 9 days    Time spent: 35 minutes    Sidney Ace, MD [facility] Pager [phone]  If 7PM-7AM, please contact night-coverage 09/12/2019, 1:13 PM

## 2019-09-12 NOTE — TOC Progression Note (Addendum)
Transition of Care [facility]) - Progression Note    Patient Details  Name: Elizabeth [name] MRN: [id number] Date of Birth: Jun 17, 1927  [facility] CM/SW Contact  Margarito Liner, LCSW Phone Number: 09/12/2019, 12:25 PM  Clinical Narrative:  Insurance authorization still pending. [employer] called the SNF and said they never got therapy notes then shortly after said they found they were faxed over two days ago but never attached to her authorization documents. SNF is faxing over yesterday's PT note as well. No indication whether we will get authorization today or not.   4:05 pm: Insurance authorization still pending.  Expected Discharge Plan: Skilled Nursing Facility Barriers to Discharge: Continued Medical Work up  Expected Discharge Plan and Services Expected Discharge Plan: Skilled Nursing Facility       Living arrangements for the past 2 months: Single Family Home                                       Social Determinants of Health (SDOH) Interventions    Readmission Risk Interventions No flowsheet data found.

## 2019-09-13 LAB — BASIC METABOLIC PANEL
Anion gap: 10 (ref 5–15)
BUN: 25 mg/dL — ABNORMAL HIGH (ref 8–23)
CO2: 27 mmol/L (ref 22–32)
Calcium: 9.1 mg/dL (ref 8.9–10.3)
Chloride: 102 mmol/L (ref 98–111)
Creatinine, Ser: 0.72 mg/dL (ref 0.44–1.00)
GFR calc Af Amer: >60 mL/min (ref >60)
GFR calc non Af Amer: >60 mL/min (ref >60)
Glucose, Bld: 113 mg/dL — ABNORMAL HIGH (ref 70–99)
Potassium: 4 mmol/L (ref 3.5–5.1)
Sodium: 139 mmol/L (ref 135–145)

## 2019-09-13 LAB — MAGNESIUM: Magnesium: 1.7 mg/dL (ref 1.7–2.4)

## 2019-09-13 NOTE — Progress Notes (Signed)
PROGRESS NOTE    Elizabeth [name]  ZOX:[id number] DOB: Aug 09, 1927 DOA: 09/02/2019 PCP: Tracie Harrier, MD  Brief Narrative:  86 year old female with history of severe dementia presented with altered mental status, syncope and poor responsiveness. Patient was seen on her driveway on the evening of admission and then found shortly by her neighbor lying on the ground for unknown duration. Her caregiver and sister-in-law had noticed that she had some right-sided weakness for the past 1 week. In the ED patient was found to have potassium of 3.4 and markedly elevated high sensitive troponin of 629. EKG showed acute inferior ST elevation infarct. Patient started on aspirin, high-dose statin, Kengreal and Brilinta and taken to Cath Lab urgently and had 3 drug-eluting stent to the RCA. Tolerated procedure well.  6/2: Seen and examined.  Resting in bed.  In no distress.  Pleasantly confused.  No pain complaints.  Pending insurance authorization for skilled nursing facility placement.  6/3: Patient seen and examined.  Sitting up in bed.  Eating.  Remains pleasantly demented.  6/4: Patient seen and examined.  Sitting up in bed eating.  Assistance by patient care tech.  In no acute distress.  Pending insurance authorization for skilled nursing facility.  6/5: Patient seen and examined.  Very pleasant.  Lying in bed.  In no visible distress.  Blood pressure relatively low this morning.  A.m. blood pressure medications held.  Continue to be pending insurance authorization.    Assessment & Plan:   Principal Problem:   STEMI (ST elevation myocardial infarction) ([place]) Active Problems:   STEMI involving right coronary artery (HCC)   Protein-calorie malnutrition, severe  Principal problem Inferior STEMI S/p PCI x3 to RCA. Cardiology recommends dual antiplatelet therapy for 12 months. Currently on aspirin and Brilinta for now and possibly switch Brilinta to Plavix after 1 month. Continue  Lipitor and Lopressor. 2D echo with normal EF.  Active problems Right-sided weakness Unclear duration. MRI brain shows no acute finding with chronic small vessel ischemia and a remote bilateral cerebellar infarct. on aspirin and statin. PT recommends SNF.  Pending insurance authorization  Dementia, moderate to severe Episodes of delirium while in the hospital.  Stable past 24 hours.   Protein calorie malnutrition, severe Added nutrition supplement. Monitor potassium, magnesium and phosphorus daily and replenish given risk for refeeding syndrome.  Essential hypertension Continue current meds  Hypokalemia Continue daily potassium supplement.  Diarrhea Resolved.  Imodium as needed.  Low suspicion for infectious diarrhea.   DVT prophylaxis: Lovenox Code Status: Full Family Communication: Sister-in-law Ofilia Neas [phone] on 09/10/2019 Disposition Plan: Status is: Inpatient   Remains inpatient appropriate because:Unsafe d/c plan   Dispo: The patient is from: Home              Anticipated d/c is to: SNF              Anticipated d/c date is: 1 day              Patient currently is medically stable to d/c.  Pending insurance authorization for SNF placement.   Patient is medically stable for discharge.  Consultants:   None  Procedures:   Left heart catheterization with PCI  Antimicrobials:   None   Subjective: Seen and examined.  No distress.  Pleasantly confused.  Objective: Vitals:   09/12/19 1157 09/12/19 2217 09/13/19 0450 09/13/19 1151  BP: 107/61 92/81 113/69 103/69  Pulse: 64 69 67 60  Resp: 14 20 18 14   Temp: 98.7 F (37.1 C)  97.8 F (36.6 C) 97.6 F (36.4 C) 98.1 F (36.7 C)  TempSrc: Oral Oral Oral Oral  SpO2: 97% 99% 97% 98%  Weight:      Height:        Intake/Output Summary (Last 24 hours) at 09/13/2019 1300 Last data filed at 09/13/2019 1000 Gross per 24 hour  Intake 480 ml  Output --  Net 480 ml   Filed Weights    09/02/19 2043 09/03/19 0110  Weight: 49 kg 52.1 kg    Examination:  General exam: Appears calm, pleasantly confused Respiratory system: Clear to auscultation. Respiratory effort normal. Cardiovascular system: S1 & S2 heard, RRR. No JVD, murmurs, rubs, gallops or clicks. No pedal edema. Gastrointestinal system: Abdomen is nondistended, soft and nontender. No organomegaly or masses felt. Normal bowel sounds heard. Central nervous system: Alert and oriented. No focal neurological deficits. Extremities: Symmetric 5 x 5 power. Skin: No rashes, lesions or ulcers Psychiatry: Judgement and insight appear normal. Mood & affect appropriate.     Data Reviewed: I have personally reviewed following labs and imaging studies  CBC: No results for input(s): WBC, NEUTROABS, HGB, HCT, MCV, PLT in the last 168 hours. Basic Metabolic Panel: Recent Labs  Lab 09/07/19 0437 09/07/19 0437 09/08/19 0504 09/08/19 0504 09/09/19 0456 09/10/19 0506 09/11/19 0550 09/12/19 0423 09/13/19 0541  NA 137  --  136  --   --   --   --   --  139  K 3.4*   < > 3.4*   < > 3.6 3.7 4.0 3.9 4.0  CL 104  --  105  --   --   --   --   --  102  CO2 25  --  21*  --   --   --   --   --  27  GLUCOSE 139*  --  110*  --   --   --   --   --  113*  BUN 22  --  20  --   --   --   --   --  25*  CREATININE 0.65  --  0.61  --   --  0.85  --   --  0.72  CALCIUM 8.2*  --  8.7*  --   --   --   --   --  9.1  MG  --   --  1.8   < > 1.9 1.9 1.8 1.6* 1.7  PHOS  --   --   --   --  2.8 3.2 3.9 3.8  --    < > = values in this interval not displayed.   GFR: Estimated Creatinine Clearance: 37 mL/min (by C-G formula based on SCr of 0.72 mg/dL). Liver Function Tests: No results for input(s): AST, ALT, ALKPHOS, BILITOT, PROT, ALBUMIN in the last 168 hours. No results for input(s): LIPASE, AMYLASE in the last 168 hours. No results for input(s): AMMONIA in the last 168 hours. Coagulation Profile: No results for input(s): INR, PROTIME in the  last 168 hours. Cardiac Enzymes: No results for input(s): CKTOTAL, CKMB, CKMBINDEX, TROPONINI in the last 168 hours. BNP (last 3 results) No results for input(s): PROBNP in the last 8760 hours. HbA1C: No results for input(s): HGBA1C in the last 72 hours. CBG: No results for input(s): GLUCAP in the last 168 hours. Lipid Profile: No results for input(s): CHOL, HDL, LDLCALC, TRIG, CHOLHDL, LDLDIRECT in the last 72 hours. Thyroid Function Tests: No results for input(s): TSH, T4TOTAL,  FREET4, T3FREE, THYROIDAB in the last 72 hours. Anemia Panel: No results for input(s): VITAMINB12, FOLATE, FERRITIN, TIBC, IRON, RETICCTPCT in the last 72 hours. Sepsis Labs: No results for input(s): PROCALCITON, LATICACIDVEN in the last 168 hours.  Recent Results (from the past 240 hour(s))  SARS CORONAVIRUS 2 (TAT 6-24 HRS) Nasopharyngeal Nasopharyngeal Swab     Status: None   Collection Time: 09/09/19 12:45 PM   Specimen: Nasopharyngeal Swab  Result Value Ref Range Status   SARS Coronavirus 2 NEGATIVE NEGATIVE Final    Comment: (NOTE) SARS-CoV-2 target nucleic acids are NOT DETECTED. The SARS-CoV-2 RNA is generally detectable in upper and lower respiratory specimens during the acute phase of infection. Negative results do not preclude SARS-CoV-2 infection, do not rule out co-infections with other pathogens, and should not be used as the sole basis for treatment or other patient management decisions. Negative results must be combined with clinical observations, patient history, and epidemiological information. The expected result is Negative. Fact Sheet for Patients: HairSlick.no Fact Sheet for Healthcare Providers: quierodirigir.com This test is not yet approved or cleared by the [place] FDA and  has been authorized for detection and/or diagnosis of SARS-CoV-2 by FDA under an Emergency Use Authorization (EUA). This EUA will remain  in  effect (meaning this test can be used) for the duration of the COVID-19 declaration under Section 56 4(b)(1) of the Act, 21 U.S.C. section 360bbb-3(b)(1), unless the authorization is terminated or revoked sooner. Performed at [facility], [address], [place], [place] [address]          Radiology Studies: No results found.      Scheduled Meds: . aspirin  81 mg Oral Daily  . atorvastatin  80 mg Oral Daily  . enoxaparin (LOVENOX) injection  40 mg Subcutaneous Q24H  . metoprolol tartrate  12.5 mg Oral BID  . potassium chloride  40 mEq Oral Daily  . sodium chloride flush  3 mL Intravenous Q12H  . ticagrelor  90 mg Oral BID   Continuous Infusions:   LOS: 10 days    Time spent: 35 minutes    Tresa Moore, MD [facility] Pager [phone]  If 7PM-7AM, please contact night-coverage 09/13/2019, 1:00 PM

## 2019-09-14 NOTE — Progress Notes (Signed)
PROGRESS NOTE    Elizabeth [name]  JIR:[id number] DOB: January 16, 1928 DOA: 09/02/2019 PCP: Barbette Reichmann, MD  Brief Narrative:  86 year old female with history of severe dementia presented with altered mental status, syncope and poor responsiveness. Patient was seen on her driveway on the evening of admission and then found shortly by her neighbor lying on the ground for unknown duration. Her caregiver and sister-in-law had noticed that she had some right-sided weakness for the past 1 week. In the ED patient was found to have potassium of 3.4 and markedly elevated high sensitive troponin of 629. EKG showed acute inferior ST elevation infarct. Patient started on aspirin, high-dose statin, Kengreal and Brilinta and taken to Cath Lab urgently and had 3 drug-eluting stent to the RCA. Tolerated procedure well.  6/2: Seen and examined.  Resting in bed.  In no distress.  Pleasantly confused.  No pain complaints.  Pending insurance authorization for skilled nursing facility placement.  6/3: Patient seen and examined.  Sitting up in bed.  Eating.  Remains pleasantly demented.  6/4: Patient seen and examined.  Sitting up in bed eating.  Assistance by patient care tech.  In no acute distress.  Pending insurance authorization for skilled nursing facility.  6/5: Patient seen and examined.  Very pleasant.  Lying in bed.  In no visible distress.  Blood pressure relatively low this morning.  A.m. blood pressure medications held.  Continue to be pending insurance authorization.  6/6: Patient seen and examined.  Resting in bed.  In no visible distress.  Relative hypotension this morning.  A.m. blood pressure meds held    Assessment & Plan:   Principal Problem:   STEMI (ST elevation myocardial infarction) (HCC) Active Problems:   STEMI involving right coronary artery (HCC)   Protein-calorie malnutrition, severe  Principal problem Inferior STEMI S/p PCI x3 to RCA. Cardiology recommends dual  antiplatelet therapy for 12 months. Currently on aspirin and Brilinta for now and possibly switch Brilinta to Plavix after 1 month. Continue Lipitor and Lopressor. 2D echo with normal EF.  Active problems Right-sided weakness Unclear duration. MRI brain shows no acute finding with chronic small vessel ischemia and a remote bilateral cerebellar infarct. on aspirin and statin. PT recommends SNF.  Pending insurance authorization  Dementia, moderate to severe Episodes of delirium while in the hospital.  Stable past 24 hours.   Protein calorie malnutrition, severe Added nutrition supplement. Monitor potassium, magnesium and phosphorus daily and replenish given risk for refeeding syndrome.  Essential hypertension Continue current meds  Hypokalemia Continue daily potassium supplement.  Diarrhea Resolved.  Imodium as needed.  Low suspicion for infectious diarrhea.   DVT prophylaxis: Lovenox Code Status: Full Family Communication: Sister-in-law Floreen Comber [phone] on 09/10/2019 Disposition Plan: Status is: Inpatient   Remains inpatient appropriate because:Unsafe d/c plan   Dispo: The patient is from: Home              Anticipated d/c is to: SNF              Anticipated d/c date is: 1 day              Patient currently is medically stable to d/c.  Pending insurance authorization for SNF placement.   Patient is medically stable for discharge.  Consultants:   None  Procedures:   Left heart catheterization with PCI  Antimicrobials:   None   Subjective: Seen and examined.  No distress.  Pleasantly confused.  Objective: Vitals:   09/13/19 1151 09/13/19 2013 09/14/19 7824  09/14/19 0633  BP: 103/69 111/83 (!) 84/72 102/66  Pulse: 60 76 81   Resp: 14 18    Temp: 98.1 F (36.7 C) 98.4 F (36.9 C) 97.8 F (36.6 C)   TempSrc: Oral Oral Oral   SpO2: 98% 96% 99%   Weight:      Height:        Intake/Output Summary (Last 24 hours) at 09/14/2019  1044 Last data filed at 09/13/2019 2200 Gross per 24 hour  Intake 300 ml  Output 100 ml  Net 200 ml   Filed Weights   09/02/19 2043 09/03/19 0110  Weight: 49 kg 52.1 kg    Examination:  General exam: Appears calm, pleasantly confused Respiratory system: Clear to auscultation. Respiratory effort normal. Cardiovascular system: S1 & S2 heard, RRR. No JVD, murmurs, rubs, gallops or clicks. No pedal edema. Gastrointestinal system: Abdomen is nondistended, soft and nontender. No organomegaly or masses felt. Normal bowel sounds heard. Central nervous system: Alert and oriented. No focal neurological deficits. Extremities: Symmetric 5 x 5 power. Skin: No rashes, lesions or ulcers Psychiatry: Judgement and insight appear normal. Mood & affect appropriate.     Data Reviewed: I have personally reviewed following labs and imaging studies  CBC: No results for input(s): WBC, NEUTROABS, HGB, HCT, MCV, PLT in the last 168 hours. Basic Metabolic Panel: Recent Labs  Lab 09/08/19 0504 09/08/19 0504 09/09/19 0456 09/10/19 0506 09/11/19 0550 09/12/19 0423 09/13/19 0541  NA 136  --   --   --   --   --  139  K 3.4*   < > 3.6 3.7 4.0 3.9 4.0  CL 105  --   --   --   --   --  102  CO2 21*  --   --   --   --   --  27  GLUCOSE 110*  --   --   --   --   --  113*  BUN 20  --   --   --   --   --  25*  CREATININE 0.61  --   --  0.85  --   --  0.72  CALCIUM 8.7*  --   --   --   --   --  9.1  MG 1.8   < > 1.9 1.9 1.8 1.6* 1.7  PHOS  --   --  2.8 3.2 3.9 3.8  --    < > = values in this interval not displayed.   GFR: Estimated Creatinine Clearance: 37 mL/min (by C-G formula based on SCr of 0.72 mg/dL). Liver Function Tests: No results for input(s): AST, ALT, ALKPHOS, BILITOT, PROT, ALBUMIN in the last 168 hours. No results for input(s): LIPASE, AMYLASE in the last 168 hours. No results for input(s): AMMONIA in the last 168 hours. Coagulation Profile: No results for input(s): INR, PROTIME in the  last 168 hours. Cardiac Enzymes: No results for input(s): CKTOTAL, CKMB, CKMBINDEX, TROPONINI in the last 168 hours. BNP (last 3 results) No results for input(s): PROBNP in the last 8760 hours. HbA1C: No results for input(s): HGBA1C in the last 72 hours. CBG: No results for input(s): GLUCAP in the last 168 hours. Lipid Profile: No results for input(s): CHOL, HDL, LDLCALC, TRIG, CHOLHDL, LDLDIRECT in the last 72 hours. Thyroid Function Tests: No results for input(s): TSH, T4TOTAL, FREET4, T3FREE, THYROIDAB in the last 72 hours. Anemia Panel: No results for input(s): VITAMINB12, FOLATE, FERRITIN, TIBC, IRON, RETICCTPCT in the last 72  hours. Sepsis Labs: No results for input(s): PROCALCITON, LATICACIDVEN in the last 168 hours.  Recent Results (from the past 240 hour(s))  SARS CORONAVIRUS 2 (TAT 6-24 HRS) Nasopharyngeal Nasopharyngeal Swab     Status: None   Collection Time: 09/09/19 12:45 PM   Specimen: Nasopharyngeal Swab  Result Value Ref Range Status   SARS Coronavirus 2 NEGATIVE NEGATIVE Final    Comment: (NOTE) SARS-CoV-2 target nucleic acids are NOT DETECTED. The SARS-CoV-2 RNA is generally detectable in upper and lower respiratory specimens during the acute phase of infection. Negative results do not preclude SARS-CoV-2 infection, do not rule out co-infections with other pathogens, and should not be used as the sole basis for treatment or other patient management decisions. Negative results must be combined with clinical observations, patient history, and epidemiological information. The expected result is Negative. Fact Sheet for Patients: HairSlick.no Fact Sheet for Healthcare Providers: quierodirigir.com This test is not yet approved or cleared by the [place] FDA and  has been authorized for detection and/or diagnosis of SARS-CoV-2 by FDA under an Emergency Use Authorization (EUA). This EUA will remain  in  effect (meaning this test can be used) for the duration of the COVID-19 declaration under Section 56 4(b)(1) of the Act, 21 U.S.C. section 360bbb-3(b)(1), unless the authorization is terminated or revoked sooner. Performed at [facility], [address] [place], [place] [address]          Radiology Studies: No results found.      Scheduled Meds: . aspirin  81 mg Oral Daily  . atorvastatin  80 mg Oral Daily  . enoxaparin (LOVENOX) injection  40 mg Subcutaneous Q24H  . metoprolol tartrate  12.5 mg Oral BID  . potassium chloride  40 mEq Oral Daily  . sodium chloride flush  3 mL Intravenous Q12H  . ticagrelor  90 mg Oral BID   Continuous Infusions:   LOS: 11 days    Time spent: 35 minutes    Tresa Moore, MD [facility] Pager [phone]  If 7PM-7AM, please contact night-coverage 09/14/2019, 10:44 AM

## 2019-09-14 NOTE — TOC Progression Note (Signed)
Transition of Care [facility]) - Progression Note    Patient Details  Name: Elizabeth [name] MRN: [id number] Date of Birth: [birth year]  Transition of Care [facility]) CM/SW Contact  Bad Axe Cellar, RN Phone Number: 09/14/2019, 10:13 AM  Clinical Narrative:    LVMM for Misty Stanley @ [employer]-[phone] requesting update on SNF request.    Expected Discharge Plan: Skilled Nursing Facility Barriers to Discharge: Continued Medical Work up  Expected Discharge Plan and Services Expected Discharge Plan: Skilled Nursing Facility       Living arrangements for the past 2 months: Single Family Home                                       Social Determinants of Health (SDOH) Interventions    Readmission Risk Interventions No flowsheet data found.

## 2019-09-14 NOTE — Progress Notes (Signed)
Physical Therapy Treatment Patient Details Name: Elizabeth [name] MRN: [id number] DOB: 01/29/1928 Today's Date: 09/14/2019    History of Present Illness Elizabeth [name] is a 64yoF who [facility] c AMS, decreased responsiveness, syncope. PMH: dementia and R side weakness    PT Comments    Patient performs bed mobility with min assist supine <> sit and transfers sit to stand with min assist and RW. She ambulates 20 feet with RW and min assist. She has unsteady balance and follows 1 step commands inconsistently. She needs assist with standing balance with BUE and RW. She will continue to benefit from skilled PT to improve mobility and strength.   Follow Up Recommendations  SNF;Supervision/Assistance - 24 hour     Equipment Recommendations  Rolling walker with 5" wheels    Recommendations for Other Services       Precautions / Restrictions Precautions Precautions: Fall Restrictions Weight Bearing Restrictions: No Other Position/Activity Restrictions: cardiac monitoring    Mobility  Bed Mobility Overal bed mobility: Needs Assistance Bed Mobility: Supine to Sit;Sit to Supine     Supine to sit: Min assist Sit to supine: Min assist   General bed mobility comments: difficulty with leaning fwd and pushing up from bed surface.  Transfers Overall transfer level: Needs assistance Equipment used: Rolling walker (2 wheeled) Transfers: Sit to/from Stand Sit to Stand: Mod assist         General transfer comment: cues for leaning fwd and keeping her feet under her  Ambulation/Gait Ambulation/Gait assistance: Min assist Gait Distance (Feet): 20 Feet Assistive device: Rolling walker (2 wheeled) Gait Pattern/deviations: Step-through pattern Gait velocity: (decreased gait speed)       Stairs             Wheelchair Mobility    Modified Rankin (Stroke Patients Only)       Balance Overall balance assessment: Needs assistance Sitting-balance  support: Feet supported Sitting balance-Leahy Scale: Fair     Standing balance support: Bilateral upper extremity supported Standing balance-Leahy Scale: Fair Standing balance comment: needs CGA                            Cognition Arousal/Alertness: Awake/alert Behavior During Therapy: WFL for tasks assessed/performed Overall Cognitive Status: History of cognitive impairments - at baseline Area of Impairment: Orientation;Attention;Following commands                 Orientation Level: Place;Time;Situation Current Attention Level: Alternating   Following Commands: Follows one step commands inconsistently Safety/Judgement: Decreased awareness of safety;Decreased awareness of deficits   Problem Solving: Slow processing        Exercises      General Comments        Pertinent Vitals/Pain      Home Living                      Prior Function            PT Goals (current goals can now be found in the care plan section) Progress towards PT goals: Progressing toward goals    Frequency    Min 2X/week      PT Plan Current plan remains appropriate    Co-evaluation              AM-PAC PT "6 Clicks" Mobility   Outcome Measure  Help needed turning from your back to your side while in a flat bed without using  bedrails?: A Little Help needed moving from lying on your back to sitting on the side of a flat bed without using bedrails?: A Little Help needed moving to and from a bed to a chair (including a wheelchair)?: A Little Help needed standing up from a chair using your arms (e.g., wheelchair or bedside chair)?: A Little Help needed to walk in hospital room?: A Little Help needed climbing 3-5 steps with a railing? : A Lot 6 Click Score: 17    End of Session Equipment Utilized During Treatment: Gait belt Activity Tolerance: Patient tolerated treatment well Patient left: in bed;with call bell/phone within reach;with bed alarm  set Nurse Communication: Mobility status PT Visit Diagnosis: Unsteadiness on feet (R26.81)     Time: 1210-1230 PT Time Calculation (min) (ACUTE ONLY): 20 min  Charges:  $Therapeutic Activity: 8-22 mins                        Jones Broom S, PT DPT 09/14/2019, 1:16 PM

## 2019-09-15 LAB — SARS CORONAVIRUS 2 BY RT PCR (HOSPITAL ORDER, PERFORMED IN ~~LOC~~ HOSPITAL LAB): SARS Coronavirus 2: NEGATIVE

## 2019-09-15 NOTE — Care Management Important Message (Signed)
Important Message  Patient Details  Name: Elizabeth [name] MRN: [id number] Date of Birth: Mar 28, 1928   Medicare Important Message Given:  Yes     Johnell Comings 09/15/2019, 11:54 AM

## 2019-09-15 NOTE — Progress Notes (Signed)
PROGRESS NOTE    Elizabeth [name]  ONG:[id number] DOB: May 04, 1927 DOA: 09/02/2019 PCP: Barbette Reichmann, MD  Brief Narrative:  86 year old female with history of severe dementia presented with altered mental status, syncope and poor responsiveness. Patient was seen on her driveway on the evening of admission and then found shortly by her neighbor lying on the ground for unknown duration. Her caregiver and sister-in-law had noticed that she had some right-sided weakness for the past 1 week. In the ED patient was found to have potassium of 3.4 and markedly elevated high sensitive troponin of 629. EKG showed acute inferior ST elevation infarct. Patient started on aspirin, high-dose statin, Kengreal and Brilinta and taken to Cath Lab urgently and had 3 drug-eluting stent to the RCA. Tolerated procedure well.  6/2: Seen and examined.  Resting in bed.  In no distress.  Pleasantly confused.  No pain complaints.  Pending insurance authorization for skilled nursing facility placement.  6/3: Patient seen and examined.  Sitting up in bed.  Eating.  Remains pleasantly demented.  6/4: Patient seen and examined.  Sitting up in bed eating.  Assistance by patient care tech.  In no acute distress.  Pending insurance authorization for skilled nursing facility.  6/5: Patient seen and examined.  Very pleasant.  Lying in bed.  In no visible distress.  Blood pressure relatively low this morning.  A.m. blood pressure medications held.  Continue to be pending insurance authorization.  6/6: Patient seen and examined.  Resting in bed.  In no visible distress.  Relative hypotension this morning.  A.m. blood pressure meds held.  6/7: Patient seen and examined.  Resting in bed.  In no visible distress.  Hemodynamically stable.    Assessment & Plan:   Principal Problem:   STEMI (ST elevation myocardial infarction) (HCC) Active Problems:   STEMI involving right coronary artery (HCC)   Protein-calorie  malnutrition, severe  Principal problem Inferior STEMI S/p PCI x3 to RCA. Cardiology recommends dual antiplatelet therapy for 12 months. Currently on aspirin and Brilinta for now and possibly switch Brilinta to Plavix after 1 month. Continue Lipitor and Lopressor. 2D echo with normal EF.  Active problems Right-sided weakness Unclear duration. MRI brain shows no acute finding with chronic small vessel ischemia and a remote bilateral cerebellar infarct. on aspirin and statin. PT recommends SNF.   [employer] Medicare initially denied. I completed a peer to peer with [place] physician advisor Decision reversed Skilled nursing facility notified of peer to peer Awaiting final determination from [facility]   Dementia, moderate to severe Episodes of delirium while in the hospital.  Stable past 24 hours.   Protein calorie malnutrition, severe Added nutrition supplement. Monitor potassium, magnesium and phosphorus daily and replenish given risk for refeeding syndrome.  Essential hypertension Continue current meds  Hypokalemia Continue daily potassium supplement.  Diarrhea Resolved.  Imodium as needed.  Low suspicion for infectious diarrhea.   DVT prophylaxis: Lovenox Code Status: Full Family Communication: none today Disposition Plan: Status is: Inpatient   Remains inpatient appropriate because:Unsafe d/c plan   Dispo: The patient is from: Home              Anticipated d/c is to: SNF              Anticipated d/c date is: 1 day              Patient currently is medically stable to d/c.  [employer] Medicare initially denied. I completed a peer to peer with [facility] physician  advisor Patient is a significantly weak and debilitated She is unsafe to discharge home to an on monitored setting Decision reversed Skilled nursing facility notified of peer to peer Awaiting final determination from [facility] Discharge to skilled nursing facility once final determination has  been made   Consultants:   None  Procedures:   Left heart catheterization with PCI  Antimicrobials:   None   Subjective: Seen and examined.  No distress.  Pleasantly confused.  Objective: Vitals:   09/14/19 2014 09/15/19 0550 09/15/19 0958 09/15/19 1145  BP: 119/79 137/78 110/70 (!) 121/100  Pulse: 75 73 66 69  Resp: 20 16  12   Temp: 98.2 F (36.8 C) 97.7 F (36.5 C)  97.9 F (36.6 C)  TempSrc: Oral Oral  Oral  SpO2: 99% 96%  98%  Weight:      Height:        Intake/Output Summary (Last 24 hours) at 09/15/2019 1347 Last data filed at 09/15/2019 0550 Gross per 24 hour  Intake 0 ml  Output 0 ml  Net 0 ml   Filed Weights   09/02/19 2043 09/03/19 0110  Weight: 49 kg 52.1 kg    Examination:  General exam: Appears calm, pleasantly confused Respiratory system: Clear to auscultation. Respiratory effort normal. Cardiovascular system: S1 & S2 heard, RRR. No JVD, murmurs, rubs, gallops or clicks. No pedal edema. Gastrointestinal system: Abdomen is nondistended, soft and nontender. No organomegaly or masses felt. Normal bowel sounds heard. Central nervous system: Alert and oriented. No focal neurological deficits. Extremities: Symmetric 5 x 5 power. Skin: No rashes, lesions or ulcers Psychiatry: Judgement and insight appear normal. Mood & affect appropriate.     Data Reviewed: I have personally reviewed following labs and imaging studies  CBC: No results for input(s): WBC, NEUTROABS, HGB, HCT, MCV, PLT in the last 168 hours. Basic Metabolic Panel: Recent Labs  Lab 09/09/19 0456 09/10/19 0506 09/11/19 0550 09/12/19 0423 09/13/19 0541  NA  --   --   --   --  139  K 3.6 3.7 4.0 3.9 4.0  CL  --   --   --   --  102  CO2  --   --   --   --  27  GLUCOSE  --   --   --   --  113*  BUN  --   --   --   --  25*  CREATININE  --  0.85  --   --  0.72  CALCIUM  --   --   --   --  9.1  MG 1.9 1.9 1.8 1.6* 1.7  PHOS 2.8 3.2 3.9 3.8  --    GFR: Estimated Creatinine  Clearance: 37 mL/min (by C-G formula based on SCr of 0.72 mg/dL). Liver Function Tests: No results for input(s): AST, ALT, ALKPHOS, BILITOT, PROT, ALBUMIN in the last 168 hours. No results for input(s): LIPASE, AMYLASE in the last 168 hours. No results for input(s): AMMONIA in the last 168 hours. Coagulation Profile: No results for input(s): INR, PROTIME in the last 168 hours. Cardiac Enzymes: No results for input(s): CKTOTAL, CKMB, CKMBINDEX, TROPONINI in the last 168 hours. BNP (last 3 results) No results for input(s): PROBNP in the last 8760 hours. HbA1C: No results for input(s): HGBA1C in the last 72 hours. CBG: No results for input(s): GLUCAP in the last 168 hours. Lipid Profile: No results for input(s): CHOL, HDL, LDLCALC, TRIG, CHOLHDL, LDLDIRECT in the last 72 hours. Thyroid Function Tests:  No results for input(s): TSH, T4TOTAL, FREET4, T3FREE, THYROIDAB in the last 72 hours. Anemia Panel: No results for input(s): VITAMINB12, FOLATE, FERRITIN, TIBC, IRON, RETICCTPCT in the last 72 hours. Sepsis Labs: No results for input(s): PROCALCITON, LATICACIDVEN in the last 168 hours.  Recent Results (from the past 240 hour(s))  SARS CORONAVIRUS 2 (TAT 6-24 HRS) Nasopharyngeal Nasopharyngeal Swab     Status: None   Collection Time: 09/09/19 12:45 PM   Specimen: Nasopharyngeal Swab  Result Value Ref Range Status   SARS Coronavirus 2 NEGATIVE NEGATIVE Final    Comment: (NOTE) SARS-CoV-2 target nucleic acids are NOT DETECTED. The SARS-CoV-2 RNA is generally detectable in upper and lower respiratory specimens during the acute phase of infection. Negative results do not preclude SARS-CoV-2 infection, do not rule out co-infections with other pathogens, and should not be used as the sole basis for treatment or other patient management decisions. Negative results must be combined with clinical observations, patient history, and epidemiological information. The expected result is  Negative. Fact Sheet for Patients: HairSlick.no Fact Sheet for Healthcare Providers: quierodirigir.com This test is not yet approved or cleared by the [place] FDA and  has been authorized for detection and/or diagnosis of SARS-CoV-2 by FDA under an Emergency Use Authorization (EUA). This EUA will remain  in effect (meaning this test can be used) for the duration of the COVID-19 declaration under Section 56 4(b)(1) of the Act, 21 U.S.C. section 360bbb-3(b)(1), unless the authorization is terminated or revoked sooner. Performed at [facility], [address] [place], [place] [address]          Radiology Studies: No results found.      Scheduled Meds: . aspirin  81 mg Oral Daily  . atorvastatin  80 mg Oral Daily  . enoxaparin (LOVENOX) injection  40 mg Subcutaneous Q24H  . metoprolol tartrate  12.5 mg Oral BID  . potassium chloride  40 mEq Oral Daily  . sodium chloride flush  3 mL Intravenous Q12H  . ticagrelor  90 mg Oral BID   Continuous Infusions:   LOS: 12 days    Time spent: 35 minutes    Tresa Moore, MD [facility] Pager [phone]  If 7PM-7AM, please contact night-coverage 09/15/2019, 1:47 PM

## 2019-09-15 NOTE — Progress Notes (Signed)
Nutrition Follow Up Note   DOCUMENTATION CODES:   Severe malnutrition in context of chronic illness  INTERVENTION:   Provide Hormel Shake po BID with lunch and dinner, each supplement provides 520 kcal and 22 grams of protein.  Provide Magic cup BID with lunch and dinner, each supplement provides 290 kcal and 9 grams of protein.  NUTRITION DIAGNOSIS:   Severe Malnutrition related to chronic illness as evidenced by severe fat depletion, severe muscle depletion.  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   Pt with fair appetite and oral intake in hospital; pt eating anywhere from 25-100% of meals. Pt is being offered supplements on meal trays. Of note, pt does not like milk. Recommend continue supplements after discharge. No new weight since admit; will request weekly weights. Pt currently awaiting insurance authorization for discharge.   Medications reviewed and include: aspirin, lovenox, KCl  Labs reviewed: BUN 25(H), P 3.8 wnl, Mg 1.7 wnl  Diet Order:   Diet Order            DIET DYS 3 Room service appropriate? Yes; Fluid consistency: Nectar Thick  Diet effective now             EDUCATION NEEDS:   No education needs have been identified at this time  Skin:  Skin Assessment: Reviewed RN Assessment  Last BM:  6/7- type 7  Height:   Ht Readings from Last 1 Encounters:  09/03/19 5\' 2"  (1.575 m)   Weight:   Wt Readings from Last 1 Encounters:  09/03/19 52.1 kg   BMI:  Body mass index is 21.01 kg/m.  Estimated Nutritional Needs:   Kcal:  1400-1600  Protein:  70-80 grams  Fluid:  1.4-1.6 L/day  09/05/19 MS, RD, LDN Please refer to [facility] for RD and/or RD on-call/weekend/after hours pager

## 2019-09-15 NOTE — TOC Progression Note (Addendum)
Transition of Care [facility]) - Progression Note    Patient Details  Name: Elizabeth [name] MRN: [id number] Date of Birth: [birth year]  Transition of Care [facility]) CM/SW Contact  Margarito Liner, LCSW Phone Number: 09/15/2019, 8:14 AM  Clinical Narrative: Insurance authorization denied. Sent phone number to MD for peer-to-peer review. Notified him it expires at noon today.  10:35 am: Called and updated patient's HCPOA. MD waiting on [employer] Medicare to call him back for peer-to-peer. Patient has personal care services coming out Monday-Friday for five hours a day. HCPOA checks on her on the weekends. HCPOA is waiting on call backs from several other agencies to see if they can provide 24/7 care. CSW notified her that if she is still denied, we could always work on home health services to assist as well. HCPOA will try calling the personal care service agencies again.  11:20 am: Peer-to-peer complete. Notified SNF admissions coordinator. Waiting on final determination from [facility].  Expected Discharge Plan: Skilled Nursing Facility Barriers to Discharge: Continued Medical Work up  Expected Discharge Plan and Services Expected Discharge Plan: Skilled Nursing Facility       Living arrangements for the past 2 months: Single Family Home                                       Social Determinants of Health (SDOH) Interventions    Readmission Risk Interventions No flowsheet data found.

## 2019-09-15 NOTE — Progress Notes (Signed)
Occupational Therapy Treatment Patient Details Name: Elizabeth [name] MRN: [id number] DOB: 05/30/1927 Today's Date: 09/15/2019    History of present illness Elizabeth [name] is a 47yoF who [facility] c AMS, decreased responsiveness, syncope. PMH: dementia and R side weakness   OT comments  Pt seen for OT tx this date. Pt pleasant, alert, and oriented to self. Nursing just finished up with bed level bathing tasks right before session. However, pt agreeable to therapy. Set up and 1 VC to initiate grooming tasks but otherwise able to perform. Pt instructed in BUE ther ex to improve strength for ADL mobility and ADL tasks. Required verbal cue and visual demo to support pt's participation to initiate, continue, and complete ex; HR in 70-'s throughout, RR up to mid 40's briefly with exertion and decreased back down under 20 with rest breaks; pt denied SOB or "racing" HR. Pt continues to benefit from skilled OT services to maximize return to PLOF, minimize risk of falls, ,minimize risk of functional decline and caregiver burden. Continue to recommend SNF.   Follow Up Recommendations  SNF;Supervision/Assistance - 24 hour    Equipment Recommendations  3 in 1 bedside commode    Recommendations for Other Services      Precautions / Restrictions Precautions Precautions: Fall Restrictions Weight Bearing Restrictions: No Other Position/Activity Restrictions: cardiac monitoring       Mobility Bed Mobility               General bed mobility comments: deferred 2/2 just completed bathing task with nursing  Transfers                 General transfer comment: deferred 2/2 just completed bathing task with nursing    Balance                                           ADL either performed or assessed with clinical judgement   ADL Overall ADL's : Needs assistance/impaired     Grooming: Wash/dry face;Oral care;Sitting;Set up Grooming Details  (indicate cue type and reason): oral care with mouth rinse and sponges, set up of materials and 1 verbal cue to initiate but then able to complete task without additional cues                                     Vision Patient Visual Report: No change from baseline     Perception     Praxis      Cognition Arousal/Alertness: Awake/alert Behavior During Therapy: WFL for tasks assessed/performed Overall Cognitive Status: History of cognitive impairments - at baseline                                 General Comments: pleasantly confused, oriented to self only, follows 1 step commands with verbal cue and visual cue        Exercises Other Exercises Other Exercises: Pt instructed in BUE ther ex to improve strength for ADL mobility and ADL tasks. Required verbal cue and visual demo to support pt's participation to initiate, continue, and complete ex; HR in 70-'s throughout, RR up to mid 40's briefly with exertion and decreased back down under 20 with rest breaks; pt denied SOB or "racing" HR  Shoulder Instructions       General Comments      Pertinent Vitals/ Pain       Pain Assessment: No/denies pain  Home Living                                          Prior Functioning/Environment              Frequency  Min 1X/week        Progress Toward Goals  OT Goals(current goals can now be found in the care plan section)  Progress towards OT goals: Progressing toward goals  Acute Rehab OT Goals Patient Stated Goal: Unable to state OT Goal Formulation: Patient unable to participate in goal setting Time For Goal Achievement: 09/19/19 Potential to Achieve Goals: Fair  Plan Discharge plan remains appropriate;Frequency remains appropriate    Co-evaluation                 AM-PAC OT "6 Clicks" Daily Activity     Outcome Measure   Help from another person eating meals?: A Little Help from another person taking care of  personal grooming?: A Little Help from another person toileting, which includes using toliet, bedpan, or urinal?: A Lot Help from another person bathing (including washing, rinsing, drying)?: A Lot Help from another person to put on and taking off regular upper body clothing?: A Little Help from another person to put on and taking off regular lower body clothing?: A Lot 6 Click Score: 15    End of Session    OT Visit Diagnosis: Unsteadiness on feet (R26.81);Other abnormalities of gait and mobility (R26.89);Muscle weakness (generalized) (M62.81)   Activity Tolerance Patient tolerated treatment well   Patient Left in bed;with call bell/phone within reach;with bed alarm set   Nurse Communication          Time: 8250-5397 OT Time Calculation (min): 27 min  Charges: OT General Charges $OT Visit: 1 Visit OT Treatments $Self Care/Home Management : 8-22 mins $Therapeutic Exercise: 8-22 mins  Richrd Prime, MPH, MS, OTR/L ascom [phone] 09/15/19, 12:06 PM

## 2019-09-16 MED ORDER — POTASSIUM CHLORIDE 20 MEQ PO PACK: 40.0000 meq | PACK | Freq: Every day | ORAL | Status: DC

## 2019-09-16 MED ORDER — NITROGLYCERIN 0.4 MG SL SUBL: 0.4000 mg | SUBLINGUAL_TABLET | SUBLINGUAL | 12 refills | Status: DC | PRN

## 2019-09-16 MED ORDER — ASPIRIN 81 MG PO CHEW: 81.0000 mg | CHEWABLE_TABLET | Freq: Every day | ORAL | Status: DC

## 2019-09-16 MED ORDER — TICAGRELOR 90 MG PO TABS: 90.0000 mg | ORAL_TABLET | Freq: Two times a day (BID) | ORAL | Status: DC

## 2019-09-16 MED ORDER — ATORVASTATIN CALCIUM 80 MG PO TABS: 80.0000 mg | ORAL_TABLET | Freq: Every day | ORAL | Status: DC

## 2019-09-16 MED ORDER — METOPROLOL TARTRATE 25 MG PO TABS: 12.5000 mg | ORAL_TABLET | Freq: Two times a day (BID) | ORAL | Status: DC

## 2019-09-16 NOTE — TOC Progression Note (Signed)
Transition of Care [facility]) - Progression Note    Patient Details  Name: Elizabeth [name] MRN: [id number] Date of Birth: [birth year]  Transition of Care [facility]) CM/SW Contact  Margarito Liner, LCSW Phone Number: 09/16/2019, 9:12 AM  Clinical Narrative: Insurance authorization approved. COVID test done yesterday and was negative. [facility] is able to accept patient today. MD aware.    Expected Discharge Plan: Skilled Nursing Facility Barriers to Discharge: Continued Medical Work up  Expected Discharge Plan and Services Expected Discharge Plan: Skilled Nursing Facility       Living arrangements for the past 2 months: Single Family Home                                       Social Determinants of Health (SDOH) Interventions    Readmission Risk Interventions No flowsheet data found.

## 2019-09-16 NOTE — Progress Notes (Signed)
RN called report to [facility]. Report given to Caralyn Guile.

## 2019-09-16 NOTE — Discharge Summary (Signed)
Physician Discharge Summary  Elizabeth [name]:[id number] DOB: 09-Feb-1928 DOA: 09/02/2019  PCP: Barbette Reichmann, MD  Admit date: 09/02/2019 Discharge date: 09/16/2019  Admitted From: Home Disposition: Skilled nursing facility  Recommendations for Outpatient Follow-up:  1. Follow up with PCP in 1-2 weeks 2. Follow-up with cardiology in 2 weeks  Home Health: No Equipment/Devices: None  Discharge Condition: Stable CODE STATUS: Full Diet recommendation: Heart Healthy/dysphagia 3  Brief/Interim Summary: 86 year old female with history of severe dementia presented with altered mental status, syncope and poor responsiveness. Patient was seen on her driveway on the evening of admission and then found shortly by her neighbor lying on the ground for unknown duration. Her caregiver and sister-in-law had noticed that she had some right-sided weakness for the past 1 week. In the ED patient was found to have potassium of 3.4 and markedly elevated high sensitive troponin of 629. EKG showed acute inferior ST elevation infarct. Patient started on aspirin, high-dose statin, Kengreal and Brilinta and taken to Cath Lab urgently and had 3 drug-eluting stent to the RCA. Tolerated procedure well.  6/2: Seen and examined.  Resting in bed.  In no distress.  Pleasantly confused.  No pain complaints.  Pending insurance authorization for skilled nursing facility placement.  6/3: Patient seen and examined.  Sitting up in bed.  Eating.  Remains pleasantly demented.  6/4: Patient seen and examined.  Sitting up in bed eating.  Assistance by patient care tech.  In no acute distress.  Pending insurance authorization for skilled nursing facility.  6/5: Patient seen and examined.  Very pleasant.  Lying in bed.  In no visible distress.  Blood pressure relatively low this morning.  A.m. blood pressure medications held.  Continue to be pending insurance authorization.  6/6: Patient seen and examined.   Resting in bed.  In no visible distress.  Relative hypotension this morning.  A.m. blood pressure meds held.  6/7: Patient seen and examined.  Resting in bed.  In no visible distress.  Hemodynamically stable.  6/8: Peer-to-peer conducted with [facility] physician advisor.  Denial reversed.  Patient accepted to skilled nursing facility.  Discharged in stable condition.  Goal-directed medical therapy for carotid artery disease prescribed on discharge.  Discharge Diagnoses:  Principal Problem:   STEMI (ST elevation myocardial infarction) (HCC) Active Problems:   STEMI involving right coronary artery (HCC)   Protein-calorie malnutrition, severe Principal problem Inferior STEMI S/p PCI x3 to RCA. Cardiology recommends dual antiplatelet therapy for 12 months. Currently on aspirin and Brilinta for now and possibly switch Brilinta to Plavix after 1 month. Continue Lipitor and Lopressor. 2D echo with normal EF. Follow-up with cardiology in 2 to 3 weeks  Active problems Right-sided weakness Unclear duration. MRI brain shows no acute finding with chronic small vessel ischemia and a remote bilateral cerebellar infarct. on aspirin and statin. PT recommends SNF.   [employer] initially denied. I completed a peer to peer with [place] physician advisor Decision reversed Skilled nursing facility notified of peer to peer Patient excepted to skilled nursing facility   Dementia, moderate to severe Episodes of delirium while in the hospital. Stable past 24 hours.   Protein calorie malnutrition, severe Added nutrition supplement. Monitor potassium, magnesium and phosphorus daily and replenish given risk for refeeding syndrome.  Essential hypertension Continue current meds  Hypokalemia Continue daily potassium supplement.  Diarrhea Resolved.  Imodium as needed.  Low suspicion for infectious diarrhea.   Discharge Instructions  Discharge Instructions    AMB Referral to Cardiac  Rehabilitation -  Phase II   Complete by: As directed    Diagnosis:  Coronary Stents STEMI     After initial evaluation and assessments completed: Virtual Based Care may be provided alone or in conjunction with Phase 2 Cardiac Rehab based on patient barriers.: Yes   Diet - low sodium heart healthy   Complete by: As directed    Increase activity slowly   Complete by: As directed      Allergies as of 09/16/2019   No Known Allergies     Medication List    TAKE these medications   aspirin 81 MG chewable tablet Chew 1 tablet (81 mg total) by mouth daily. Start taking on: September 17, 2019   atorvastatin 80 MG tablet Commonly known as: LIPITOR Take 1 tablet (80 mg total) by mouth daily. Start taking on: September 17, 2019   metoprolol tartrate 25 MG tablet Commonly known as: LOPRESSOR Take 0.5 tablets (12.5 mg total) by mouth 2 (two) times daily.   nitroGLYCERIN 0.4 MG SL tablet Commonly known as: NITROSTAT Place 1 tablet (0.4 mg total) under the tongue every 5 (five) minutes as needed for chest pain.   potassium chloride 20 MEQ packet Commonly known as: KLOR-CON Take 40 mEq by mouth daily. Start taking on: September 17, 2019   ticagrelor 90 MG Tabs tablet Commonly known as: BRILINTA Take 1 tablet (90 mg total) by mouth 2 (two) times daily.       Contact information for follow-up providers    End, Cristal Deer, MD. Go on 09/23/2019.   Specialty: Cardiology Why: 2:30pm appointment Contact information: [address] [place] [address] [phone]        Barbette Reichmann, MD. Schedule an appointment as soon as possible for a visit in 1 week(s).   Specialty: Internal Medicine Contact information: [address] [place] [address] [phone]            Contact information for after-discharge care    Destination    [facility] Preferred SNF .   Service: Skilled Nursing Contact information: [address] [place] [address] [phone]                 No Known Allergies  Consultations:  Cardiology-[facility]   Procedures/Studies: CT Head Wo Contrast  Result Date: 09/02/2019 CLINICAL DATA:  86 year old post unwitnessed fall. EXAM: CT HEAD WITHOUT CONTRAST TECHNIQUE: Contiguous axial images were obtained from the base of the skull through the vertex without intravenous contrast. COMPARISON:  Head CT 06/26/2017 FINDINGS: Brain: No intracranial hemorrhage, mass effect, or midline shift. Stable degree of atrophy and chronic small vessel ischemia. No hydrocephalus. The basilar cisterns are patent. Remote left cerebellar infarct is unchanged. No evidence of territorial infarct or acute ischemia. No extra-axial or intracranial fluid collection. Vascular: Atherosclerosis of skullbase vasculature without hyperdense vessel or abnormal calcification. Skull: No fracture or focal lesion. Chronic bifrontal hyperostosis. Sinuses/Orbits: Paranasal sinuses and mastoid air cells are clear. The visualized orbits are unremarkable. Bilateral cataract resection. Other: None. IMPRESSION: 1. No acute intracranial abnormality. No skull fracture. 2. Stable atrophy and chronic small vessel ischemia. Remote left cerebellar infarct. Electronically Signed   By: Narda Rutherford M.D.   On: 09/02/2019 21:33   CT Cervical Spine Wo Contrast  Result Date: 09/02/2019 CLINICAL DATA:  Post unwitnessed fall. EXAM: CT CERVICAL SPINE WITHOUT CONTRAST TECHNIQUE: Multidetector CT imaging of the cervical spine was performed without intravenous contrast. Multiplanar CT image reconstructions were  also generated. COMPARISON:  None. FINDINGS: Alignment: Trace anterolisthesis of C7 on T1 is likely degenerative. No traumatic subluxation. Skull base and vertebrae: No acute fracture. Cervical vertebral body heights are preserved. Minor superior endplate compression deformities of T1 and T2. the dens and skull base are intact. No  evidence of focal lesion. Soft tissues and spinal canal: No prevertebral fluid or swelling. No visible canal hematoma. Disc levels: Diffuse multilevel degenerative disc disease with disc space narrowing and endplate spurring. There is prominent facet hypertrophy throughout. Upper chest: Minor septal thickening at the apices and may represent pulmonary edema. Other: None. IMPRESSION: 1. No acute fracture or traumatic subluxation of the cervical spine. 2. Multilevel degenerative disc disease and facet hypertrophy. 3. Minor chronic appearing superior endplate compression deformities of T1 and T2. 4. Possible pulmonary edema at the lung apices. Electronically Signed   By: Narda Rutherford M.D.   On: 09/02/2019 21:37   MR BRAIN WO CONTRAST  Result Date: 09/03/2019 CLINICAL DATA:  Neuro deficit, acute, stroke suspected. EXAM: MRI HEAD WITHOUT CONTRAST TECHNIQUE: Multiplanar, multiecho pulse sequences of the brain and surrounding structures were obtained without intravenous contrast. COMPARISON:  Head CT Sep 02, 2019 FINDINGS: The study is significantly degraded by motion. Brain: No acute infarction, hemorrhage, hydrocephalus, extra-axial collection or mass lesion. Small remote infarcts are seen in the bilateral cerebellar hemispheres and right centrum semiovale. Scattered and confluent foci of T2 hyperintensity are seen within the white matter of the cerebral hemispheres, basal ganglia, thalami and pons, nonspecific, most likely related to chronic small vessel ischemia. Prominence of the cerebral and cerebellar sulci and supratentorial ventricles reflecting parenchymal volume loss. Vascular: Normal flow voids. Ectatic supraclinoid right ICA noted. Skull and upper cervical spine: Normal marrow signal. Sinuses/Orbits: Bilateral lens surgery. Paranasal sinuses are essentially clear. IMPRESSION: 1. No acute intracranial abnormality. 2. Small remote infarcts in the bilateral cerebellar hemispheres and right centrum  semiovale. 3. Advanced chronic small vessel ischemia and moderate parenchymal volume loss. Electronically Signed   By: Baldemar Lenis M.D.   On: 09/03/2019 14:24   CARDIAC CATHETERIZATION  Result Date: 09/03/2019 Conclusions: 1. Severe single-vessel coronary artery disease with multifocal areas of severe stenosis involving the RCA.  This likely represents extensive plaquing with acute plaque rupture and thrombus formation consistent with type I MI. 2. Mild, nonobstructive coronary artery disease involving the LAD and LCx. 3. Grossly normal left ventricular systolic function with mildly elevated filling pressure. 4. Successful PCI to the proximal through distal RCA using overlapping synergy 4.0 x 12 mm (proximal) 4.0 x 32 mm (mid) and 4.0 x 38 mm (distal) drug-eluting stents with 0% residual stenosis and TIMI-3 flow. Recommendations: 1. Admit to ICU for post STEMI care/monitoring. 2. Continue cangrelor for 1 hour post PCI. 3. Dual antiplatelet therapy for at least 12 months.  I suggest continuing aspirin and ticagrelor for one month, at which time we could consider switching ticagrelor to clopidogrel to lessen the risk for bleeding. 4. Aggressive secondary prevention, including high intensity statin therapy. Yvonne Kendall, MD [facility]   ECHOCARDIOGRAM COMPLETE  Result Date: 09/03/2019    ECHOCARDIOGRAM REPORT   Patient Name:   SUNSHYNE HORVATH Date of Exam: 09/03/2019 Medical Rec #:  [id number]             Height:       62.0 in Accession #:    [id number]            Weight:       114.9 lb  Date of Birth:  [birth year]              BSA:          1.510 m Patient Age:    34 years              BP:           101/52 mmHg Patient Gender: F                     HR:           69 bpm. Exam Location:  [facility] Procedure: 2D Echo, Color Doppler and Cardiac Doppler Indications:     Acute myocardial infarction 410  History:         Patient has no prior history of Echocardiogram examinations.                   Dementia.  Sonographer:     Charmayne Sheer RDCS (AE) Referring Phys:  [phone] CHRISTOPHER END Diagnosing Phys: Kate Sable MD  Sonographer Comments: Suboptimal apical window. Image acquisition challenging due to breast implants. IMPRESSIONS  1. Left ventricular ejection fraction, by estimation, is 65 to 70%. The left ventricle has normal function. The left ventricle has no regional wall motion abnormalities. Left ventricular diastolic parameters were normal.  2. Right ventricular systolic function is normal. The right ventricular size is normal.  3. The mitral valve is normal in structure. Trivial mitral valve regurgitation. No evidence of mitral stenosis.  4. The aortic valve is normal in structure. Aortic valve regurgitation is not visualized. No aortic stenosis is present. FINDINGS  Left Ventricle: Left ventricular ejection fraction, by estimation, is 65 to 70%. The left ventricle has normal function. The left ventricle has no regional wall motion abnormalities. The left ventricular internal cavity size was normal in size. There is  no left ventricular hypertrophy. Left ventricular diastolic parameters were normal. Right Ventricle: The right ventricular size is normal. No increase in right ventricular wall thickness. Right ventricular systolic function is normal. Left Atrium: Left atrial size was normal in size. Right Atrium: Right atrial size was normal in size. Pericardium: There is no evidence of pericardial effusion. Mitral Valve: The mitral valve is normal in structure. Normal mobility of the mitral valve leaflets. Trivial mitral valve regurgitation. No evidence of mitral valve stenosis. MV peak gradient, 3.0 mmHg. The mean mitral valve gradient is 1.0 mmHg. Tricuspid Valve: The tricuspid valve is normal in structure. Tricuspid valve regurgitation is not demonstrated. No evidence of tricuspid stenosis. Aortic Valve: The aortic valve is normal in structure. Aortic valve regurgitation is not visualized. No  aortic stenosis is present. Aortic valve mean gradient measures 5.0 mmHg. Aortic valve peak gradient measures 9.1 mmHg. Aortic valve area, by VTI measures 1.65 cm. Pulmonic Valve: The pulmonic valve was normal in structure. Pulmonic valve regurgitation is not visualized. No evidence of pulmonic stenosis. Aorta: The aortic root is normal in size and structure. Venous: The inferior vena cava was not well visualized. IAS/Shunts: No atrial level shunt detected by color flow Doppler.  LEFT VENTRICLE PLAX 2D LVIDd:         3.04 cm  Diastology LVIDs:         1.82 cm  LV e' lateral:   8.70 cm/s LV PW:         0.68 cm  LV E/e' lateral: 11.3 LV IVS:        0.88 cm  LV e' medial:    9.57  cm/s LVOT diam:     1.70 cm  LV E/e' medial:  10.3 LV SV:         54 LV SV Index:   36 LVOT Area:     2.27 cm  LEFT ATRIUM           Index LA diam:      3.10 cm 2.05 cm/m LA Vol (A4C): 52.3 ml 34.63 ml/m  AORTIC VALVE                    PULMONIC VALVE AV Area (Vmax):    1.71 cm     PV Vmax:       0.81 m/s AV Area (Vmean):   1.62 cm     PV Vmean:      52.000 cm/s AV Area (VTI):     1.65 cm     PV VTI:        0.143 m AV Vmax:           151.00 cm/s  PV Peak grad:  2.6 mmHg AV Vmean:          110.000 cm/s PV Mean grad:  1.0 mmHg AV VTI:            0.326 m AV Peak Grad:      9.1 mmHg AV Mean Grad:      5.0 mmHg LVOT Vmax:         114.00 cm/s LVOT Vmean:        78.300 cm/s LVOT VTI:          0.237 m LVOT/AV VTI ratio: 0.73  AORTA Ao Root diam: 3.10 cm MITRAL VALVE               TRICUSPID VALVE MV Area (PHT): 3.19 cm    TR Peak grad:   23.4 mmHg MV Peak grad:  3.0 mmHg    TR Vmax:        242.00 cm/s MV Mean grad:  1.0 mmHg MV Vmax:       0.86 m/s    SHUNTS MV Vmean:      54.2 cm/s   Systemic VTI:  0.24 m MV Decel Time: 238 msec    Systemic Diam: 1.70 cm MV E velocity: 98.10 cm/s MV A velocity: 62.10 cm/s MV E/A ratio:  1.58 Debbe Odea MD Electronically signed by Debbe Odea MD Signature Date/Time: 09/03/2019/2:25:55 PM    Final      (Echo, Carotid, EGD, Colonoscopy, ERCP)    Subjective: Patient seen and examined.  Stable, no acute distress.  Stable for discharge to skilled nursing facility  Discharge Exam: Vitals:   09/15/19 2234 09/16/19 0415  BP: 105/84 122/71  Pulse: 82 87  Resp:  13  Temp:  97.6 F (36.4 C)  SpO2:  99%   Vitals:   09/15/19 1622 09/15/19 1932 09/15/19 2234 09/16/19 0415  BP: 102/62 107/61 105/84 122/71  Pulse:  84 82 87  Resp:  13  13  Temp:  98.7 F (37.1 C)  97.6 F (36.4 C)  TempSrc:  Oral  Oral  SpO2:  97%  99%  Weight:      Height:        General: Pt is alert, awake, not in acute distress Cardiovascular: RRR, S1/S2 +, no rubs, no gallops Respiratory: CTA bilaterally, no wheezing, no rhonchi Abdominal: Soft, NT, ND, bowel sounds + Extremities: no edema, no cyanosis    The results of significant diagnostics from this hospitalization (including imaging,  microbiology, ancillary and laboratory) are listed below for reference.     Microbiology: Recent Results (from the past 240 hour(s))  SARS CORONAVIRUS 2 (TAT 6-24 HRS) Nasopharyngeal Nasopharyngeal Swab     Status: None   Collection Time: 09/09/19 12:45 PM   Specimen: Nasopharyngeal Swab  Result Value Ref Range Status   SARS Coronavirus 2 NEGATIVE NEGATIVE Final    Comment: (NOTE) SARS-CoV-2 target nucleic acids are NOT DETECTED. The SARS-CoV-2 RNA is generally detectable in upper and lower respiratory specimens during the acute phase of infection. Negative results do not preclude SARS-CoV-2 infection, do not rule out co-infections with other pathogens, and should not be used as the sole basis for treatment or other patient management decisions. Negative results must be combined with clinical observations, patient history, and epidemiological information. The expected result is Negative. Fact Sheet for Patients: HairSlick.nohttps://www.fda.gov/media/138098/download Fact Sheet for Healthcare  Providers: quierodirigir.comhttps://www.fda.gov/media/138095/download This test is not yet approved or cleared by the [place] FDA and  has been authorized for detection and/or diagnosis of SARS-CoV-2 by FDA under an Emergency Use Authorization (EUA). This EUA will remain  in effect (meaning this test can be used) for the duration of the COVID-19 declaration under Section 56 4(b)(1) of the Act, 21 U.S.C. section 360bbb-3(b)(1), unless the authorization is terminated or revoked sooner. Performed at [facility], [address] [place], [place] [address]   SARS Coronavirus 2 by RT PCR (hospital order, performed in [facility] hospital lab) Nasopharyngeal Nasopharyngeal Swab     Status: None   Collection Time: 09/15/19  4:18 PM   Specimen: Nasopharyngeal Swab  Result Value Ref Range Status   SARS Coronavirus 2 NEGATIVE NEGATIVE Final    Comment: (NOTE) SARS-CoV-2 target nucleic acids are NOT DETECTED. The SARS-CoV-2 RNA is generally detectable in upper and lower respiratory specimens during the acute phase of infection. The lowest concentration of SARS-CoV-2 viral copies this assay can detect is 250 copies / mL. A negative result does not preclude SARS-CoV-2 infection and should not be used as the sole basis for treatment or other patient management decisions.  A negative result may occur with improper specimen collection / handling, submission of specimen other than nasopharyngeal swab, presence of viral mutation(s) within the areas targeted by this assay, and inadequate number of viral copies (<250 copies / mL). A negative result must be combined with clinical observations, patient history, and epidemiological information. Fact Sheet for Patients:   BoilerBrush.com.cyhttps://www.fda.gov/media/136312/download Fact Sheet for Healthcare Providers: https://pope.com/https://www.fda.gov/media/136313/download This test is not yet approved or cleared  by the [place] FDA and has been authorized for detection and/or diagnosis  of SARS-CoV-2 by FDA under an Emergency Use Authorization (EUA).  This EUA will remain in effect (meaning this test can be used) for the duration of the COVID-19 declaration under Section 564(b)(1) of the Act, 21 U.S.C. section 360bbb-3(b)(1), unless the authorization is terminated or revoked sooner. Performed at [facility], [address] [place], [place] [address]      Labs: BNP (last 3 results) No results for input(s): BNP in the last 8760 hours. Basic Metabolic Panel: Recent Labs  Lab 09/10/19 0506 09/11/19 0550 09/12/19 0423 09/13/19 0541  NA  --   --   --  139  K 3.7 4.0 3.9 4.0  CL  --   --   --  102  CO2  --   --   --  27  GLUCOSE  --   --   --  113*  BUN  --   --   --  25*  CREATININE 0.85  --   --  0.72  CALCIUM  --   --   --  9.1  MG 1.9 1.8 1.6* 1.7  PHOS 3.2 3.9 3.8  --    Liver Function Tests: No results for input(s): AST, ALT, ALKPHOS, BILITOT, PROT, ALBUMIN in the last 168 hours. No results for input(s): LIPASE, AMYLASE in the last 168 hours. No results for input(s): AMMONIA in the last 168 hours. CBC: No results for input(s): WBC, NEUTROABS, HGB, HCT, MCV, PLT in the last 168 hours. Cardiac Enzymes: No results for input(s): CKTOTAL, CKMB, CKMBINDEX, TROPONINI in the last 168 hours. BNP: Invalid input(s): POCBNP CBG: No results for input(s): GLUCAP in the last 168 hours. D-Dimer No results for input(s): DDIMER in the last 72 hours. Hgb A1c No results for input(s): HGBA1C in the last 72 hours. Lipid Profile No results for input(s): CHOL, HDL, LDLCALC, TRIG, CHOLHDL, LDLDIRECT in the last 72 hours. Thyroid function studies No results for input(s): TSH, T4TOTAL, T3FREE, THYROIDAB in the last 72 hours.  Invalid input(s): FREET3 Anemia work up No results for input(s): VITAMINB12, FOLATE, FERRITIN, TIBC, IRON, RETICCTPCT in the last 72 hours. Urinalysis    Component Value Date/Time   COLORURINE YELLOW (A) 09/03/2019 0351    APPEARANCEUR CLOUDY (A) 09/03/2019 0351   LABSPEC >1.046 (H) 09/03/2019 0351   PHURINE 8.0 09/03/2019 0351   GLUCOSEU 50 (A) 09/03/2019 0351   HGBUR NEGATIVE 09/03/2019 0351   BILIRUBINUR NEGATIVE 09/03/2019 0351   KETONESUR NEGATIVE 09/03/2019 0351   PROTEINUR NEGATIVE 09/03/2019 0351   NITRITE NEGATIVE 09/03/2019 0351   LEUKOCYTESUR NEGATIVE 09/03/2019 0351   Sepsis Labs Invalid input(s): PROCALCITONIN,  WBC,  LACTICIDVEN Microbiology Recent Results (from the past 240 hour(s))  SARS CORONAVIRUS 2 (TAT 6-24 HRS) Nasopharyngeal Nasopharyngeal Swab     Status: None   Collection Time: 09/09/19 12:45 PM   Specimen: Nasopharyngeal Swab  Result Value Ref Range Status   SARS Coronavirus 2 NEGATIVE NEGATIVE Final    Comment: (NOTE) SARS-CoV-2 target nucleic acids are NOT DETECTED. The SARS-CoV-2 RNA is generally detectable in upper and lower respiratory specimens during the acute phase of infection. Negative results do not preclude SARS-CoV-2 infection, do not rule out co-infections with other pathogens, and should not be used as the sole basis for treatment or other patient management decisions. Negative results must be combined with clinical observations, patient history, and epidemiological information. The expected result is Negative. Fact Sheet for Patients: HairSlick.no Fact Sheet for Healthcare Providers: quierodirigir.com This test is not yet approved or cleared by the [place] FDA and  has been authorized for detection and/or diagnosis of SARS-CoV-2 by FDA under an Emergency Use Authorization (EUA). This EUA will remain  in effect (meaning this test can be used) for the duration of the COVID-19 declaration under Section 56 4(b)(1) of the Act, 21 U.S.C. section 360bbb-3(b)(1), unless the authorization is terminated or revoked sooner. Performed at [facility], [address], [place], [place] [address]   SARS  Coronavirus 2 by RT PCR (hospital order, performed in [facility] hospital lab) Nasopharyngeal Nasopharyngeal Swab     Status: None   Collection Time: 09/15/19  4:18 PM   Specimen: Nasopharyngeal Swab  Result Value Ref Range Status   SARS Coronavirus 2 NEGATIVE NEGATIVE Final    Comment: (NOTE) SARS-CoV-2 target nucleic acids are NOT DETECTED. The SARS-CoV-2 RNA is generally detectable in upper and lower  respiratory specimens during the acute phase of infection. The lowest concentration of SARS-CoV-2 viral copies this assay can detect is 250 copies / mL. A negative result does not preclude SARS-CoV-2 infection and should not be used as the sole basis for treatment or other patient management decisions.  A negative result may occur with improper specimen collection / handling, submission of specimen other than nasopharyngeal swab, presence of viral mutation(s) within the areas targeted by this assay, and inadequate number of viral copies (<250 copies / mL). A negative result must be combined with clinical observations, patient history, and epidemiological information. Fact Sheet for Patients:   BoilerBrush.com.cy Fact Sheet for Healthcare Providers: https://pope.com/ This test is not yet approved or cleared  by the [place] FDA and has been authorized for detection and/or diagnosis of SARS-CoV-2 by FDA under an Emergency Use Authorization (EUA).  This EUA will remain in effect (meaning this test can be used) for the duration of the COVID-19 declaration under Section 564(b)(1) of the Act, 21 U.S.C. section 360bbb-3(b)(1), unless the authorization is terminated or revoked sooner. Performed at [facility], [address], [place], [place] [address]      Time coordinating discharge: Over 30 minutes  SIGNED:   Tresa Moore, MD  [facility] 09/16/2019, 9:23 AM Pager   If 7PM-7AM, please contact  night-coverage

## 2019-09-16 NOTE — TOC Transition Note (Signed)
Transition of Care [facility]) - CM/SW Discharge Note   Patient Details  Name: Elizabeth [name] MRN: [id number] Date of Birth: 1927/08/09  Transition of Care [facility]) CM/SW Contact:  Margarito Liner, LCSW Phone Number: 09/16/2019, 10:18 AM   Clinical Narrative: Patient has orders to discharge to [facility] today. EMS set up for 1:30. RN will call report to [phone] (Room 709). No further concerns. CSW signing off.    Final next level of care: Skilled Nursing Facility Barriers to Discharge: Barriers Resolved   Patient Goals and CMS Choice Patient states their goals for this hospitalization and ongoing recovery are:: SNF CMS Medicare.gov Compare Post Acute Care list provided to:: Patient Represenative (must comment) Choice offered to / list presented to : [facility] POA / Guardian  Discharge Placement PASRR number recieved: 09/04/19            Patient chooses bed at: [facility] [place] Patient to be transferred to facility by: EMS Name of family member notified: Floreen Comber Patient and family notified of of transfer: 09/16/19  Discharge Plan and Services                                     Social Determinants of Health (SDOH) Interventions     Readmission Risk Interventions No flowsheet data found.

## 2019-09-16 NOTE — Progress Notes (Signed)
Elizabeth [name]  A and O x 1. VSS. Pt tolerating diet well. No complaints of pain or nausea. IV removed intact . Pt discharged via wheelchair EMS to [facility].     Allergies as of 09/16/2019   No Known Allergies     Medication List    TAKE these medications   aspirin 81 MG chewable tablet Chew 1 tablet (81 mg total) by mouth daily. Start taking on: September 17, 2019   atorvastatin 80 MG tablet Commonly known as: LIPITOR Take 1 tablet (80 mg total) by mouth daily. Start taking on: September 17, 2019   metoprolol tartrate 25 MG tablet Commonly known as: LOPRESSOR Take 0.5 tablets (12.5 mg total) by mouth 2 (two) times daily.   nitroGLYCERIN 0.4 MG SL tablet Commonly known as: NITROSTAT Place 1 tablet (0.4 mg total) under the tongue every 5 (five) minutes as needed for chest pain.   potassium chloride 20 MEQ packet Commonly known as: KLOR-CON Take 40 mEq by mouth daily. Start taking on: September 17, 2019   ticagrelor 90 MG Tabs tablet Commonly known as: BRILINTA Take 1 tablet (90 mg total) by mouth 2 (two) times daily.       Vitals:   09/16/19 1221 09/16/19 1406  BP: 92/64 129/79  Pulse: 61 69  Resp: 18 16  Temp: 97.8 F (36.6 C) (!) 97.5 F (36.4 C)  SpO2: 98% (!) 88%    Elizabeth [name]

## 2019-09-23 ENCOUNTER — Ambulatory Visit: Payer: Medicare HMO | Admitting: Family

## 2019-10-10 ENCOUNTER — Ambulatory Visit: Payer: Medicare HMO | Admitting: Family

## 2019-10-17 ENCOUNTER — Ambulatory Visit: Payer: Medicare HMO | Admitting: Family

## 2019-10-20 NOTE — Progress Notes (Deleted)
Office Visit    Patient Name: Elizabeth [name] Date of Encounter: 10/20/2019  Primary Care Provider:  Barbette Reichmann, MD Primary Cardiologist:  Yvonne Kendall, MD Electrophysiologist:  None   Chief Complaint    Elizabeth [name] is a 86 y.o. female with a hx of dementia, CAD s/p DESx3 to RCA 08/2019, HLD, CVA presents today for follow up after hospitalization for STEMI with subsequent stenting.  Past Medical History    Past Medical History:  Diagnosis Date  . Dementia [facility])    Past Surgical History:  Procedure Laterality Date  . CORONARY/GRAFT ACUTE MI REVASCULARIZATION N/A 09/02/2019   Procedure: Coronary/Graft Acute MI Revascularization;  Surgeon: Yvonne Kendall, MD;  Location: [facility] INVASIVE CV LAB;  Service: Cardiovascular;  Laterality: N/A;  . LEFT HEART CATH AND CORONARY ANGIOGRAPHY N/A 09/02/2019   Procedure: LEFT HEART CATH AND CORONARY ANGIOGRAPHY;  Surgeon: Yvonne Kendall, MD;  Location: [facility] INVASIVE CV LAB;  Service: Cardiovascular;  Laterality: N/A;    Allergies  No Known Allergies  History of Present Illness    Elizabeth [name] is a 86 y.o. female with a hx of dementia, CAD s/p DESx3 to RCA 08/2019, HLD, CVA. She was last seen while hospitalized.  She was admitted to [facility] 09/02/19 for inferolateral STEMI. Prior to presenting to the ED she had been sitting in a chair in her driveway when she was found shortly thereafter by a neighbor lying on the ground. Also noted right sided weakness for one week. CT head and cervical spine without acute abnormalities. Subsequent cardiac catheterization with DEXx3 to RCA. Recommended for DAPT x 12 months. She was discharged on Brilinta and Aspirin with mention to consider transition to Plavix in 1 month. She did have history of stroke based on MRi with small remote infarct bilateral cerebellar hemispheres with chronic small vessel ischemia.  ***  EKGs/Labs/Other Studies Reviewed:   The following studies  were reviewed today:  Echo 09/03/19  1. Left ventricular ejection fraction, by estimation, is 65 to 70%. The  left ventricle has normal function. The left ventricle has no regional  wall motion abnormalities. Left ventricular diastolic parameters were  normal.   2. Right ventricular systolic function is normal. The right ventricular  size is normal.   3. The mitral valve is normal in structure. Trivial mitral valve  regurgitation. No evidence of mitral stenosis.   4. The aortic valve is normal in structure. Aortic valve regurgitation is  not visualized. No aortic stenosis is present.   Cardiac cath 09/02/19 Conclusions: 1. Severe single-vessel coronary artery disease with multifocal areas of severe stenosis involving the RCA.  This likely represents extensive plaquing with acute plaque rupture and thrombus formation consistent with type I MI. 2. Mild, nonobstructive coronary artery disease involving the LAD and LCx. 3. Grossly normal left ventricular systolic function with mildly elevated filling pressure. 4. Successful PCI to the proximal through distal RCA using overlapping synergy 4.0 x 12 mm (proximal) 4.0 x 32 mm (mid) and 4.0 x 38 mm (distal) drug-eluting stents with 0% residual stenosis and TIMI-3 flow.   Recommendations: 1. Admit to ICU for post STEMI care/monitoring. 2. Continue cangrelor for 1 hour post PCI. 3. Dual antiplatelet therapy for at least 12 months.  I suggest continuing aspirin and ticagrelor for one month, at which time we could consider switching ticagrelor to clopidogrel to lessen the risk for bleeding. 4. Aggressive secondary prevention, including high intensity statin therapy.  EKG:  EKG is ordered today.  The  ekg ordered today demonstrates ***  Recent Labs: 09/03/2019: TSH 1.436 09/04/2019: Hemoglobin 11.6; Platelets 257 09/13/2019: BUN 25; Creatinine, Ser 0.72; Magnesium 1.7; Potassium 4.0; Sodium 139  Recent Lipid Panel    Component Value Date/Time   CHOL  144 09/03/2019 0115   TRIG 30 09/03/2019 0115   HDL 49 09/03/2019 0115   CHOLHDL 2.9 09/03/2019 0115   VLDL 6 09/03/2019 0115   LDLCALC 89 09/03/2019 0115    Home Medications   No outpatient medications have been marked as taking for the 10/21/19 encounter (Appointment) with Alver Sorrow, NP.      Review of Systems    ***   ROS All other systems reviewed and are otherwise negative except as noted above.  Physical Exam    VS:  There were no vitals taken for this visit. , BMI There is no height or weight on file to calculate BMI. GEN: Well nourished, well developed, in no acute distress. HEENT: normal. Neck: Supple, no JVD, carotid bruits, or masses. Cardiac: ***RRR, no murmurs, rubs, or gallops. No clubbing, cyanosis, edema.  ***Radials/DP/PT 2+ and equal bilaterally.  Respiratory:  ***Respirations regular and unlabored, clear to auscultation bilaterally. GI: Soft, nontender, nondistended, BS + x 4. MS: No deformity or atrophy. Skin: Warm and dry, no rash. Neuro:  Strength and sensation are intact. Psych: Normal affect.  Assessment & Plan    1. CAD s/p DESx3 RCA -  2. Remote CVA/right sided weakness - MRI 08/2019 with small remote infarct in bilateral cerebellar hemispheres. *** 3. HLD - LDL goal <70 in setting of CAD. *** 4. Hypokalemia -  5. Dementia -   Disposition: Follow up {follow up:15908} with ***   Alver Sorrow, NP 10/20/2019, 8:03 PM

## 2019-10-21 ENCOUNTER — Ambulatory Visit: Payer: Medicare HMO | Admitting: Family

## 2019-11-10 ENCOUNTER — Encounter: Payer: Self-pay | Admitting: Physician Assistant

## 2019-11-10 NOTE — Progress Notes (Signed)
Cardiology Office Note    Date:  11/11/2019   ID:  Elizabeth [name], DOB 30-Sep-1927, MRN [id number]  PCP:  Barbette Reichmann, MD  Cardiologist:  Yvonne Kendall, MD  Electrophysiologist:  None   Chief Complaint: Hospital follow-up  History of Present Illness:   Elizabeth [name] is a 86 y.o. female with history of CAD with inferior lateral ST elevation MI in 08/2019, history of CVA, dementia, and hyperlipidemia who presents for hospital follow-up after recent admission to [facility] from 5/25 to 6/8 for inferolateral ST elevation MI.   Prior to the above hospital admission she did not have any previously known cardiac history.  Patient was brought to the hospital after being found lying on her driveway for uncertain duration with associated right-sided weakness over the preceding several weeks.  Initial EKG showed sinus rhythm with nonspecific ST-T changes.  CT head and cervical spine showed no acute abnormalities with chronic small vessel ischemia and remote left cerebellar infarct being noted.  Initial high-sensitivity troponin 148 trending to 4000.  In the setting she underwent repeat EKG which showed inferior ST elevation as well as posterior changes.  She denied any symptoms concerning for angina.  She underwent emergent LHC on 09/02/2019 which showed severe single-vessel CAD with multifocal areas of severe stenosis involving the RCA.  This was felt to represent extensive plaquing with acute plaque rupture and thrombus formation.  She had mild, nonobstructive CAD involving the left coronary system.  She underwent successful PCI/DES with overlapping stents x3 to the RCA.  Echo during her admission showed an EF of 65 to 70%, no regional wall motion abnormalities, normal LV diastolic function parameters, normal RV systolic function and RV cavity size, and trivial mitral regurgitation.  She was ultimately discharged from the hospital on 6/8 months SNF bed was available.  She comes in accompanied  by her daughter today who is her healthcare power of attorney.  She has subsequently been discharged from a rehab and lives at home with 24/7 nurse aide care.  She has done well since her hospital discharge.  She has followed up with her PCP with metoprolol being discontinued secondary to dizziness.  With this change, her dizziness has resolved.  She did stumble once, landing on a sofa, otherwise she has not had any significant falls since her hospital discharge.  No hematochezia or melena.  She is tolerating aspirin, atorvastatin, and Brilinta without issues.  No lower extremity swelling.  Weight remains stable.  They do have difficulty in getting fluids and the patient at times.  Otherwise, no issues or concerns at this time.  History is provided by the healthcare power of attorney.   Labs independently reviewed: 10/2019 - Hgb 11.6, PLT 362, potassium 3.8, BUN 12, serum creatinine 0.6, AST 48, ALT 40, albumin 3.5 09/2019 - magnesium 1.7, Hgb 11.6, PLT 257, TSH normal, A1c 6.2, TC 144, TG 30, HDL 49, LDL 89  Past Medical History:  Diagnosis Date  . CAD (coronary artery disease)    a.  Inferolateral STEMI 09/02/2019  . Dementia (HCC)   . History of CVA (cerebrovascular accident)   . Hyperlipidemia LDL goal <70     Past Surgical History:  Procedure Laterality Date  . CORONARY/GRAFT ACUTE MI REVASCULARIZATION N/A 09/02/2019   Procedure: Coronary/Graft Acute MI Revascularization;  Surgeon: Yvonne Kendall, MD;  Location: [facility] INVASIVE CV LAB;  Service: Cardiovascular;  Laterality: N/A;  . LEFT HEART CATH AND CORONARY ANGIOGRAPHY N/A 09/02/2019   Procedure: LEFT HEART CATH AND  CORONARY ANGIOGRAPHY;  Surgeon: Yvonne Kendall, MD;  Location: [facility] INVASIVE CV LAB;  Service: Cardiovascular;  Laterality: N/A;    Current Medications: Current Meds  Medication Sig  . aspirin 81 MG chewable tablet Chew 1 tablet (81 mg total) by mouth daily.  Marland Kitchen atorvastatin (LIPITOR) 80 MG tablet Take 1 tablet (80 mg  total) by mouth daily.  . nitroGLYCERIN (NITROSTAT) 0.4 MG SL tablet Place 1 tablet (0.4 mg total) under the tongue every 5 (five) minutes as needed for chest pain.  . ticagrelor (BRILINTA) 90 MG TABS tablet Take 1 tablet (90 mg total) by mouth 2 (two) times daily.    Allergies:   Patient has no known allergies.   Social History   Socioeconomic History  . Marital status: Widowed    Spouse name: Not on file  . Number of children: Not on file  . Years of education: Not on file  . Highest education level: Not on file  Occupational History  . Not on file  Tobacco Use  . Smoking status: Never Smoker  . Smokeless tobacco: Never Used  Substance and Sexual Activity  . Alcohol use: Never  . Drug use: Not on file  . Sexual activity: Not on file  Other Topics Concern  . Not on file  Social History Narrative  . Not on file   Social Determinants of Health   Financial Resource Strain:   . Difficulty of Paying Living Expenses:   Food Insecurity:   . Worried About Programme researcher, broadcasting/film/video in the Last Year:   . Barista in the Last Year:   Transportation Needs:   . Freight forwarder (Medical):   Marland Kitchen Lack of Transportation (Non-Medical):   Physical Activity:   . Days of Exercise per Week:   . Minutes of Exercise per Session:   Stress:   . Feeling of Stress :   Social Connections:   . Frequency of Communication with Friends and Family:   . Frequency of Social Gatherings with Friends and Family:   . Attends Religious Services:   . Active Member of Clubs or Organizations:   . Attends Banker Meetings:   Marland Kitchen Marital Status:      Family History:  The patient's family history is not on file.  ROS:   Review of Systems  Unable to perform ROS: Dementia     EKGs/Labs/Other Studies Reviewed:    Studies reviewed were summarized above. The additional studies were reviewed today:  LHC 09/02/2019: Conclusions: 1. Severe single-vessel coronary artery disease with  multifocal areas of severe stenosis involving the RCA.  This likely represents extensive plaquing with acute plaque rupture and thrombus formation consistent with type I MI. 2. Mild, nonobstructive coronary artery disease involving the LAD and LCx. 3. Grossly normal left ventricular systolic function with mildly elevated filling pressure. 4. Successful PCI to the proximal through distal RCA using overlapping synergy 4.0 x 12 mm (proximal) 4.0 x 32 mm (mid) and 4.0 x 38 mm (distal) drug-eluting stents with 0% residual stenosis and TIMI-3 flow.  Recommendations: 1. Admit to ICU for post STEMI care/monitoring. 2. Continue cangrelor for 1 hour post PCI. 3. Dual antiplatelet therapy for at least 12 months.  I suggest continuing aspirin and ticagrelor for one month, at which time we could consider switching ticagrelor to clopidogrel to lessen the risk for bleeding. 4. Aggressive secondary prevention, including high intensity statin therapy. __________  2D echo 09/03/2019: 1. Left ventricular ejection fraction, by estimation, is  65 to 70%. The  left ventricle has normal function. The left ventricle has no regional  wall motion abnormalities. Left ventricular diastolic parameters were  normal.  2. Right ventricular systolic function is normal. The right ventricular  size is normal.  3. The mitral valve is normal in structure. Trivial mitral valve  regurgitation. No evidence of mitral stenosis.  4. The aortic valve is normal in structure. Aortic valve regurgitation is  not visualized. No aortic stenosis is present.   EKG:  EKG is ordered today.  The EKG ordered today demonstrates NSR, 64 bpm, prior inferior infarct with inferior T wave inversion, baseline artifact,, no acute ST-T changes  Recent Labs: 09/03/2019: TSH 1.436 09/04/2019: Hemoglobin 11.6; Platelets 257 09/13/2019: BUN 25; Creatinine, Ser 0.72; Magnesium 1.7; Potassium 4.0; Sodium 139  Recent Lipid Panel    Component Value  Date/Time   CHOL 144 09/03/2019 0115   TRIG 30 09/03/2019 0115   HDL 49 09/03/2019 0115   CHOLHDL 2.9 09/03/2019 0115   VLDL 6 09/03/2019 0115   LDLCALC 89 09/03/2019 0115    PHYSICAL EXAM:    VS:  BP 118/62 (BP Location: Left Arm, Patient Position: Sitting, Cuff Size: Normal)   Pulse 64   Ht 5\' 2"  (1.575 m)   Wt 98 lb 3.2 oz (44.5 kg)   SpO2 96%   BMI 17.96 kg/m   BMI: Body mass index is 17.96 kg/m.  Physical Exam Constitutional:      Appearance: She is well-developed.  HENT:     Head: Normocephalic and atraumatic.  Eyes:     General:        Right eye: No discharge.        Left eye: No discharge.  Neck:     Vascular: No JVD.  Cardiovascular:     Rate and Rhythm: Normal rate and regular rhythm.     Pulses: No midsystolic click and no opening snap.          Posterior tibial pulses are 2+ on the right side and 2+ on the left side.     Heart sounds: Normal heart sounds, S1 normal and S2 normal. Heart sounds not distant. No murmur heard.  No friction rub.     Comments: Left radial cardiac cath site has healed well without bleeding, swelling, warmth, erythema, bruising, or tenderness to palpation.  Radial pulse 2+. Pulmonary:     Effort: Pulmonary effort is normal. No respiratory distress.     Breath sounds: Normal breath sounds. No decreased breath sounds, wheezing or rales.  Chest:     Chest wall: No tenderness.  Abdominal:     General: There is no distension.     Palpations: Abdomen is soft.     Tenderness: There is no abdominal tenderness.  Musculoskeletal:     Cervical back: Normal range of motion.  Skin:    General: Skin is warm and dry.     Nails: There is no clubbing.  Neurological:     Mental Status: She is alert and oriented to person, place, and time.  Psychiatric:        Attention and Perception: Attention normal.        Mood and Affect: Mood normal.        Speech: Speech normal.        Behavior: Behavior normal. Behavior is cooperative.     Wt  Readings from Last 3 Encounters:  11/11/19 98 lb 3.2 oz (44.5 kg)  09/15/19 96 lb 1.9 oz (43.6 kg)  06/26/17 110 lb (49.9 kg)     ASSESSMENT & PLAN:   1. CAD involving the native coronary arteries with recent inferolateral STEMI:  She is doing well without any symptoms concerning for angina.  When she has completed her current bottle of Brilinta, we will transition her to clopidogrel.  Specifics and timing of this transition were discussed in detail  with the caregiver who voices understanding and repeats them back to me.  She will be loaded with clopidogrel 300 mg x 1 the following morning after taking her last evening dose of Brilinta, thereafter, she will take Plavix 75 mg daily along with ASA 81 mg daily.  She will otherwise continue atorvastatin.  She is no longer on metoprolol given dizziness, with symptoms resolving following discontinuation of this medication. Check CMP and CBC.  With her advanced age and underlying dementia she will not be participating in cardiac rehab.  No plans for further ischemic evaluation at this time.  2. HLD/transaminitis: LDL 89 with goal being less than 70.  She was not on statin therapy at the time of this lipid panel.  Follow-up CMP obtained on 7/26 through PCPs office showed mildly elevated LFTs.  For now, she remains on atorvastatin.  Recommend repeating LFTs in follow-up and if they remain elevated or are worsening we will need to discontinue atorvastatin at that time.   3. Dementia: Stable.  Follow-up with PCP as directed.  4. History of CVA: No new neurologic deficits.  She remains on antiplatelet therapy and statin as above.  Follow-up with PCP as directed  Disposition: F/u with Dr. Okey Dupre or an APP in 1 month.   Medication Adjustments/Labs and Tests Ordered: Current medicines are reviewed at length with the patient today.  Concerns regarding medicines are outlined above. Medication changes, Labs and Tests ordered today are summarized above and listed  in the Patient Instructions accessible in Encounters.   Signed, Eula Listen, PA-C 11/11/2019 10:32 AM     [facility] [address] [place], [place] [address] [phone]

## 2019-11-11 ENCOUNTER — Other Ambulatory Visit: Payer: Self-pay

## 2019-11-11 ENCOUNTER — Encounter: Payer: Self-pay | Admitting: Physician Assistant

## 2019-11-11 ENCOUNTER — Ambulatory Visit: Payer: Medicare HMO | Admitting: Physician Assistant

## 2019-11-11 VITALS — BP 118/62 | HR 64 | Ht 62.0 in | Wt 98.2 lb

## 2019-11-11 DIAGNOSIS — R7401 Elevation of levels of liver transaminase levels: Secondary | ICD-10-CM

## 2019-11-11 DIAGNOSIS — F0390 Unspecified dementia without behavioral disturbance: Secondary | ICD-10-CM

## 2019-11-11 DIAGNOSIS — I2510 Atherosclerotic heart disease of native coronary artery without angina pectoris: Secondary | ICD-10-CM

## 2019-11-11 DIAGNOSIS — I2111 ST elevation (STEMI) myocardial infarction involving right coronary artery: Secondary | ICD-10-CM | POA: Diagnosis not present

## 2019-11-11 DIAGNOSIS — E785 Hyperlipidemia, unspecified: Secondary | ICD-10-CM

## 2019-11-11 DIAGNOSIS — Z8673 Personal history of transient ischemic attack (TIA), and cerebral infarction without residual deficits: Secondary | ICD-10-CM

## 2019-11-11 MED ORDER — CLOPIDOGREL BISULFATE 75 MG PO TABS: ORAL_TABLET | ORAL | 1 refills | Status: DC

## 2019-11-11 NOTE — Patient Instructions (Addendum)
Medication Instructions:  - Your physician has recommended you make the following change in your medication:   1) Finish your current supply of Brilinta, then stop this  2) the day after you finish Brilinta, - Start plavix 75 mg- take 4 tablets (300 mg) x 1 dose on day #1, then take 1 tablet (75 mg) by mouth once daily  *If you need a refill on your cardiac medications before your next appointment, please call your pharmacy*   Lab Work: - none ordered  If you have labs (blood work) drawn today and your tests are completely normal, you will receive your results only by: Marland Kitchen MyChart Message (if you have MyChart) OR . A paper copy in the mail If you have any lab test that is abnormal or we need to change your treatment, we will call you to review the results.   Testing/Procedures: - none ordered   Follow-Up: At [facility], you and your health needs are our priority.  As part of our continuing mission to provide you with exceptional heart care, we have created designated Provider Care Teams.  These Care Teams include your primary Cardiologist (physician) and Advanced Practice Providers (APPs -  Physician Assistants and Nurse Practitioners) who all work together to provide you with the care you need, when you need it.  We recommend signing up for the patient portal called "MyChart".  Sign up information is provided on this After Visit Summary.  MyChart is used to connect with patients for Virtual Visits (Telemedicine).  Patients are able to view lab/test results, encounter notes, upcoming appointments, etc.  Non-urgent messages can be sent to your provider as well.   To learn more about what you can do with MyChart, go to [email or web address].    Your next appointment:   1 month(s)  The format for your next appointment:   In Person  Provider:    You may see Yvonne Kendall, MD or one of the following Advanced Practice Providers on your designated Care Team:    Nicolasa Ducking, NP  Eula Listen, PA-C  Marisue Ivan, PA-C    Other Instructions n/a

## 2019-11-14 ENCOUNTER — Other Ambulatory Visit: Payer: Self-pay | Admitting: Physician Assistant

## 2019-12-11 NOTE — Progress Notes (Deleted)
Cardiology Office Note    Date:  12/11/2019   ID:  Elizabeth [name], DOB 12-08-1927, MRN [id number]  PCP:  Barbette Reichmann, MD  Cardiologist:  Yvonne Kendall, MD  Electrophysiologist:  None   Chief Complaint: Follow up  History of Present Illness:   Elizabeth [name] is a 86 y.o. female with history of CAD with inferolateral ST elevation MI in 08/2019 s/p PCI/DES x3 with overlapping stents to the RCA, history of CVA, dementia, and hyperlipidemia who presents for follow up of her CAD.   She was brought to the hospital in 08/2019, after being found lying on her driveway for uncertain duration with associated right-sided weakness over the preceding several weeks.  Initial EKG showed sinus rhythm with nonspecific ST-T changes.  CT head and cervical spine showed no acute abnormalities with chronic small vessel ischemia and remote left cerebellar infarct being noted.  Initial high-sensitivity troponin 148 trending to 4000.  In this setting, she underwent repeat EKG which showed inferior ST elevation as well as posterior changes.  She denied any symptoms concerning for angina.  She underwent emergent LHC on 09/02/2019 which showed severe single-vessel CAD with multifocal areas of severe stenosis involving the RCA.  This was felt to represent extensive plaquing with acute plaque rupture and thrombus formation.  She had mild, nonobstructive CAD involving the left coronary system.  She underwent successful PCI/DES with overlapping stents x3 to the RCA.  Echo during her admission showed an EF of 65 to 70%, no regional wall motion abnormalities, normal LV diastolic function parameters, normal RV systolic function and RV cavity size, and trivial mitral regurgitation.  She was ultimately discharged from the hospital on 6/8 once a SNF bed was available.  She was seen in hospital follow up on 11/11/2019, and was doing well from a cardiac perspective.  She was back at home with 24/7 nurse aide care.   Metoprolol had been discontinued by her PCP due to dizziness with improvement in symptoms thereafter.  To minimize bleeding risk in the setting of her comorbid conditions and advanced age, she was transitioned to Plavix from Brilinta once she completed her current supply of Brilinta.  She was not participating in cardiac rehab given age and underlying dementia.    ***   Labs independently reviewed: 10/2019 - Hgb 11.6, PLT 362, potassium 3.8, BUN 12, serum creatinine 0.6, AST 48, ALT 40, albumin 3.5 09/2019 - magnesium 1.7, Hgb 11.6, PLT 257, TSH normal, A1c 6.2, TC 144, TG 30, HDL 49, LDL 89  Past Medical History:  Diagnosis Date  . CAD (coronary artery disease)    a.  Inferolateral STEMI 09/02/2019  . Dementia (HCC)   . History of CVA (cerebrovascular accident)   . Hyperlipidemia LDL goal <70     Past Surgical History:  Procedure Laterality Date  . CORONARY/GRAFT ACUTE MI REVASCULARIZATION N/A 09/02/2019   Procedure: Coronary/Graft Acute MI Revascularization;  Surgeon: Yvonne Kendall, MD;  Location: [facility] INVASIVE CV LAB;  Service: Cardiovascular;  Laterality: N/A;  . LEFT HEART CATH AND CORONARY ANGIOGRAPHY N/A 09/02/2019   Procedure: LEFT HEART CATH AND CORONARY ANGIOGRAPHY;  Surgeon: Yvonne Kendall, MD;  Location: [facility] INVASIVE CV LAB;  Service: Cardiovascular;  Laterality: N/A;    Current Medications: No outpatient medications have been marked as taking for the 12/17/19 encounter (Appointment) with Sondra Barges, PA-C.    Allergies:   Patient has no known allergies.   Social History   Socioeconomic History  . Marital status: Widowed  Spouse name: Not on file  . Number of children: Not on file  . Years of education: Not on file  . Highest education level: Not on file  Occupational History  . Not on file  Tobacco Use  . Smoking status: Never Smoker  . Smokeless tobacco: Never Used  Substance and Sexual Activity  . Alcohol use: Never  . Drug use: Not on file  . Sexual  activity: Not on file  Other Topics Concern  . Not on file  Social History Narrative  . Not on file   Social Determinants of Health   Financial Resource Strain:   . Difficulty of Paying Living Expenses: Not on file  Food Insecurity:   . Worried About Programme researcher, broadcasting/film/video in the Last Year: Not on file  . Ran Out of Food in the Last Year: Not on file  Transportation Needs:   . Lack of Transportation (Medical): Not on file  . Lack of Transportation (Non-Medical): Not on file  Physical Activity:   . Days of Exercise per Week: Not on file  . Minutes of Exercise per Session: Not on file  Stress:   . Feeling of Stress : Not on file  Social Connections:   . Frequency of Communication with Friends and Family: Not on file  . Frequency of Social Gatherings with Friends and Family: Not on file  . Attends Religious Services: Not on file  . Active Member of Clubs or Organizations: Not on file  . Attends Banker Meetings: Not on file  . Marital Status: Not on file     Family History:  The patient's family history is not on file.  ROS:   ROS   EKGs/Labs/Other Studies Reviewed:    Studies reviewed were summarized above. The additional studies were reviewed today:  LHC 09/02/2019: Conclusions: 1. Severe single-vessel coronary artery disease with multifocal areas of severe stenosis involving the RCA. This likely represents extensive plaquing with acute plaque rupture and thrombus formation consistent with type I MI. 2. Mild, nonobstructive coronary artery disease involving the LAD and LCx. 3. Grossly normal left ventricular systolic function with mildly elevated filling pressure. 4. Successful PCI to the proximal through distal RCA using overlapping synergy 4.0 x 12 mm (proximal) 4.0 x 32 mm (mid) and 4.0 x 38 mm (distal) drug-eluting stents with 0% residual stenosis and TIMI-3 flow.  Recommendations: 1. Admit to ICU for post STEMI care/monitoring. 2. Continue cangrelor  for 1 hour post PCI. 3. Dual antiplatelet therapy for at least 12 months. I suggest continuing aspirin and ticagrelor for one month, at which time we could consider switching ticagrelor to clopidogrel to lessen the risk for bleeding. 4. Aggressive secondary prevention, including high intensity statin therapy. __________  2D echo 09/03/2019: 1. Left ventricular ejection fraction, by estimation, is 65 to 70%. The  left ventricle has normal function. The left ventricle has no regional  wall motion abnormalities. Left ventricular diastolic parameters were  normal.  2. Right ventricular systolic function is normal. The right ventricular  size is normal.  3. The mitral valve is normal in structure. Trivial mitral valve  regurgitation. No evidence of mitral stenosis.  4. The aortic valve is normal in structure. Aortic valve regurgitation is  not visualized. No aortic stenosis is present.   EKG:  EKG is ordered today.  The EKG ordered today demonstrates ***  Recent Labs: 09/03/2019: TSH 1.436 09/04/2019: Hemoglobin 11.6; Platelets 257 09/13/2019: BUN 25; Creatinine, Ser 0.72; Magnesium 1.7; Potassium 4.0;  Sodium 139  Recent Lipid Panel    Component Value Date/Time   CHOL 144 09/03/2019 0115   TRIG 30 09/03/2019 0115   HDL 49 09/03/2019 0115   CHOLHDL 2.9 09/03/2019 0115   VLDL 6 09/03/2019 0115   LDLCALC 89 09/03/2019 0115    PHYSICAL EXAM:    VS:  There were no vitals taken for this visit.  BMI: There is no height or weight on file to calculate BMI.  Physical Exam  Wt Readings from Last 3 Encounters:  11/11/19 98 lb 3.2 oz (44.5 kg)  09/15/19 96 lb 1.9 oz (43.6 kg)  06/26/17 110 lb (49.9 kg)     ASSESSMENT & PLAN:   1. ***  Disposition: F/u with Dr. Okey Dupre or an APP in ***.   Medication Adjustments/Labs and Tests Ordered: Current medicines are reviewed at length with the patient today.  Concerns regarding medicines are outlined above. Medication changes, Labs and Tests  ordered today are summarized above and listed in the Patient Instructions accessible in Encounters.   Signed, Eula Listen, PA-C 12/11/2019 8:58 AM     [facility] [place] [address] [place], [place] [address] [phone]

## 2019-12-17 ENCOUNTER — Ambulatory Visit: Payer: Medicare HMO | Admitting: Physician Assistant

## 2019-12-29 NOTE — Progress Notes (Signed)
Cardiology Office Note    Date:  01/05/2020   ID:  Elizabeth [name], DOB 24-Jan-1928, MRN [id number]  PCP:  Barbette Reichmann, MD  Cardiologist:  Yvonne Kendall, MD  Electrophysiologist:  None   Chief Complaint: Follow up  History of Present Illness:   Elizabeth [name] is a 86 y.o. female with history of CAD with inferolateral ST elevation MI in 08/2019 s/p PCI/DES x3 with overlapping stents to the RCA, history of CVA, dementia, and hyperlipidemia who presents for follow up of her CAD.   She was brought to the hospital in 08/2019, after being found lying on her driveway for uncertain duration with associated right-sided weakness over the preceding several weeks.  Initial EKG showed sinus rhythm with nonspecific ST-T changes.  CT head and cervical spine showed no acute abnormalities with chronic small vessel ischemia and remote left cerebellar infarct being noted.  Initial high-sensitivity troponin 148 trending to 4000.  In this setting, she underwent repeat EKG which showed inferior ST elevation as well as posterior changes.  She denied any symptoms concerning for angina.  She underwent emergent LHC on 09/02/2019 which showed severe single-vessel CAD with multifocal areas of severe stenosis involving the RCA.  This was felt to represent extensive plaquing with acute plaque rupture and thrombus formation.  She had mild, nonobstructive CAD involving the left coronary system.  She underwent successful PCI/DES with overlapping stents x3 to the RCA.  Echo during her admission showed an EF of 65 to 70%, no regional wall motion abnormalities, normal LV diastolic function parameters, normal RV systolic function and RV cavity size, and trivial mitral regurgitation.  She was ultimately discharged from the hospital on 6/8 once a SNF bed was available.  She was seen in hospital follow up on 11/11/2019, and was doing well from a cardiac perspective.  She was back at home with 24/7 nurse aide care.   Metoprolol had been discontinued by her PCP due to dizziness with improvement in symptoms thereafter.  To minimize bleeding risk in the setting of her comorbid conditions and advanced age, she was transitioned to Plavix from Brilinta once she completed her current supply of Brilinta.  She was not participating in cardiac rehab given age and underlying dementia.    She comes in accompanied by her caregiver today and has done well from a cardiac perspective per patient and caregiver.  No chest pain, dyspnea, palpitations, dizziness, presyncope, syncope, lower extremity swelling, falls, hematochezia, or melena.  She did not transition her from Brilinta to clopidogrel following her last visit as outlined above.  The second dose of Brilinta is sometimes missed.  Her sublingual nitroglycerin has been misplaced and they requested a refill.  She does not have any issues or concerns at this time.   Labs independently reviewed: 10/2019 - Hgb 11.6, PLT 362, potassium 3.8, BUN 12, serum creatinine 0.6, AST 48, ALT 40, albumin 3.5 09/2019 - magnesium 1.7, Hgb 11.6, PLT 257, TSH normal, A1c 6.2, TC 144, TG 30, HDL 49, LDL 89   Past Medical History:  Diagnosis Date  . CAD (coronary artery disease)    a.  Inferolateral STEMI 09/02/2019  . Dementia (HCC)   . History of CVA (cerebrovascular accident)   . Hyperlipidemia LDL goal <70     Past Surgical History:  Procedure Laterality Date  . CORONARY/GRAFT ACUTE MI REVASCULARIZATION N/A 09/02/2019   Procedure: Coronary/Graft Acute MI Revascularization;  Surgeon: Yvonne Kendall, MD;  Location: [facility] INVASIVE CV LAB;  Service: Cardiovascular;  Laterality: N/A;  . LEFT HEART CATH AND CORONARY ANGIOGRAPHY N/A 09/02/2019   Procedure: LEFT HEART CATH AND CORONARY ANGIOGRAPHY;  Surgeon: Yvonne Kendall, MD;  Location: [facility] INVASIVE CV LAB;  Service: Cardiovascular;  Laterality: N/A;    Current Medications: Current Meds  Medication Sig  . aspirin 81 MG chewable tablet  Chew 1 tablet (81 mg total) by mouth daily.  Marland Kitchen atorvastatin (LIPITOR) 80 MG tablet Take 1 tablet (80 mg total) by mouth daily.  . nitroGLYCERIN (NITROSTAT) 0.4 MG SL tablet Place 1 tablet (0.4 mg total) under the tongue every 5 (five) minutes as needed for chest pain.  . ticagrelor (BRILINTA) 90 MG TABS tablet Take 90 mg by mouth 2 (two) times daily.     Allergies:   Patient has no known allergies.   Social History   Socioeconomic History  . Marital status: Widowed    Spouse name: Not on file  . Number of children: Not on file  . Years of education: Not on file  . Highest education level: Not on file  Occupational History  . Not on file  Tobacco Use  . Smoking status: Never Smoker  . Smokeless tobacco: Never Used  Substance and Sexual Activity  . Alcohol use: Never  . Drug use: Not on file  . Sexual activity: Not on file  Other Topics Concern  . Not on file  Social History Narrative  . Not on file   Social Determinants of Health   Financial Resource Strain:   . Difficulty of Paying Living Expenses: Not on file  Food Insecurity:   . Worried About Programme researcher, broadcasting/film/video in the Last Year: Not on file  . Ran Out of Food in the Last Year: Not on file  Transportation Needs:   . Lack of Transportation (Medical): Not on file  . Lack of Transportation (Non-Medical): Not on file  Physical Activity:   . Days of Exercise per Week: Not on file  . Minutes of Exercise per Session: Not on file  Stress:   . Feeling of Stress : Not on file  Social Connections:   . Frequency of Communication with Friends and Family: Not on file  . Frequency of Social Gatherings with Friends and Family: Not on file  . Attends Religious Services: Not on file  . Active Member of Clubs or Organizations: Not on file  . Attends Banker Meetings: Not on file  . Marital Status: Not on file     Family History:  The patient's family history is not on file.  ROS:   Review of Systems  Unable to  perform ROS: Dementia     EKGs/Labs/Other Studies Reviewed:    Studies reviewed were summarized above. The additional studies were reviewed today:  LHC 09/02/2019: Conclusions: 1. Severe single-vessel coronary artery disease with multifocal areas of severe stenosis involving the RCA. This likely represents extensive plaquing with acute plaque rupture and thrombus formation consistent with type I MI. 2. Mild, nonobstructive coronary artery disease involving the LAD and LCx. 3. Grossly normal left ventricular systolic function with mildly elevated filling pressure. 4. Successful PCI to the proximal through distal RCA using overlapping synergy 4.0 x 12 mm (proximal) 4.0 x 32 mm (mid) and 4.0 x 38 mm (distal) drug-eluting stents with 0% residual stenosis and TIMI-3 flow.  Recommendations: 1. Admit to ICU for post STEMI care/monitoring. 2. Continue cangrelor for 1 hour post PCI. 3. Dual antiplatelet therapy for at least 12 months. I suggest continuing aspirin  and ticagrelor for one month, at which time we could consider switching ticagrelor to clopidogrel to lessen the risk for bleeding. 4. Aggressive secondary prevention, including high intensity statin therapy. __________  2D echo 09/03/2019: 1. Left ventricular ejection fraction, by estimation, is 65 to 70%. The  left ventricle has normal function. The left ventricle has no regional  wall motion abnormalities. Left ventricular diastolic parameters were  normal.  2. Right ventricular systolic function is normal. The right ventricular  size is normal.  3. The mitral valve is normal in structure. Trivial mitral valve  regurgitation. No evidence of mitral stenosis.  4. The aortic valve is normal in structure. Aortic valve regurgitation is  not visualized. No aortic stenosis is present.   EKG:  EKG is ordered today.  The EKG ordered today demonstrates NSR, 64 bpm, low voltage QRS, prior inferior infarct, nonspecific inferior ST-T  changes, no significant change from prior tracing  Recent Labs: 09/03/2019: TSH 1.436 09/04/2019: Hemoglobin 11.6; Platelets 257 09/13/2019: BUN 25; Creatinine, Ser 0.72; Magnesium 1.7; Potassium 4.0; Sodium 139  Recent Lipid Panel    Component Value Date/Time   CHOL 144 09/03/2019 0115   TRIG 30 09/03/2019 0115   HDL 49 09/03/2019 0115   CHOLHDL 2.9 09/03/2019 0115   VLDL 6 09/03/2019 0115   LDLCALC 89 09/03/2019 0115    PHYSICAL EXAM:    VS:  BP 112/64 (BP Location: Left Arm, Patient Position: Sitting, Cuff Size: Normal)   Pulse 64   Ht 5' (1.524 m)   Wt 101 lb (45.8 kg)   SpO2 94%   BMI 19.73 kg/m   BMI: Body mass index is 19.73 kg/m.  Physical Exam Constitutional:      Appearance: She is well-developed.     Comments: Elderly and frail-appearing  HENT:     Head: Normocephalic and atraumatic.  Eyes:     General:        Right eye: No discharge.        Left eye: No discharge.  Neck:     Vascular: No JVD.  Cardiovascular:     Rate and Rhythm: Normal rate and regular rhythm.     Pulses: No midsystolic click and no opening snap.          Posterior tibial pulses are 2+ on the right side and 2+ on the left side.     Heart sounds: Normal heart sounds, S1 normal and S2 normal. Heart sounds not distant. No murmur heard.  No friction rub.  Pulmonary:     Effort: Pulmonary effort is normal. No respiratory distress.     Breath sounds: Normal breath sounds. No decreased breath sounds, wheezing or rales.  Chest:     Chest wall: No tenderness.  Abdominal:     General: There is no distension.     Palpations: Abdomen is soft.     Tenderness: There is no abdominal tenderness.  Musculoskeletal:     Cervical back: Normal range of motion.  Skin:    General: Skin is warm and dry.     Nails: There is no clubbing.  Neurological:     Mental Status: She is alert and oriented to person, place, and time.  Psychiatric:        Speech: Speech normal.        Behavior: Behavior normal.         Thought Content: Thought content normal.        Judgment: Judgment normal.     Wt Readings from  Last 3 Encounters:  01/05/20 101 lb (45.8 kg)  11/11/19 98 lb 3.2 oz (44.5 kg)  09/15/19 96 lb 1.9 oz (43.6 kg)     ASSESSMENT & PLAN:   1. CAD involving the native coronary arteries with recent inferolateral STEMI without angina: She is doing well without any symptoms concerning for angina.  Unfortunately, she did not transition from Brilinta to clopidogrel to minimize bleeding risk as was discussed at her last office visit.  She has also occasionally missed some doses of Brilinta.  In an effort to minimize missed medications recommend she discontinue Brilinta.  Start clopidogrel 300 mg x 1 at next dose of scheduled Brilinta followed by 75 mg daily clopidogrel thereafter.  Continue ASA 81 mg daily and atorvastatin.  Refill sublingual nitroglycerin.  She is not participating with cardiac rehab given advanced age and underlying dementia.  No indication for further ischemic evaluation at this time.  Check CBC.  2. HLD/transaminitis: LDL of 89 with goal being less than 70.  She was not on statin therapy at the time of her LDL being drawn as above.  Follow-up CMP in 10/2018 showed mildly elevated LFTs.  She is currently nonfasting though remains on atorvastatin.  Check LFT.  In follow-up recommend checking fasting lipid panel.  3. Dementia: Stable.  Has 24/7 home care.  Follow-up with PCP as directed.  4. History of CVA: No new neurologic deficits.  She remains on antiplatelet therapy and statin as above.  Follow-up with PCP as directed.  Disposition: F/u with Dr. Okey DupreEnd or an APP in 3 months.   Medication Adjustments/Labs and Tests Ordered: Current medicines are reviewed at length with the patient today.  Concerns regarding medicines are outlined above. Medication changes, Labs and Tests ordered today are summarized above and listed in the Patient Instructions accessible in Encounters.    Signed, Eula Listenyan Obed Samek, PA-C 01/05/2020 11:55 AM     [facility] [place] [address] [place], [place] [address] [phone]

## 2020-01-05 ENCOUNTER — Other Ambulatory Visit: Payer: Self-pay

## 2020-01-05 ENCOUNTER — Encounter: Payer: Self-pay | Admitting: Physician Assistant

## 2020-01-05 ENCOUNTER — Ambulatory Visit (INDEPENDENT_AMBULATORY_CARE_PROVIDER_SITE_OTHER): Payer: Medicare HMO | Admitting: Physician Assistant

## 2020-01-05 VITALS — BP 112/64 | HR 64 | Ht 60.0 in | Wt 101.0 lb

## 2020-01-05 DIAGNOSIS — E785 Hyperlipidemia, unspecified: Secondary | ICD-10-CM | POA: Diagnosis not present

## 2020-01-05 DIAGNOSIS — F0390 Unspecified dementia without behavioral disturbance: Secondary | ICD-10-CM

## 2020-01-05 DIAGNOSIS — I2510 Atherosclerotic heart disease of native coronary artery without angina pectoris: Secondary | ICD-10-CM

## 2020-01-05 DIAGNOSIS — Z8673 Personal history of transient ischemic attack (TIA), and cerebral infarction without residual deficits: Secondary | ICD-10-CM

## 2020-01-05 DIAGNOSIS — R7401 Elevation of levels of liver transaminase levels: Secondary | ICD-10-CM | POA: Diagnosis not present

## 2020-01-05 DIAGNOSIS — I2111 ST elevation (STEMI) myocardial infarction involving right coronary artery: Secondary | ICD-10-CM

## 2020-01-05 MED ORDER — NITROGLYCERIN 0.4 MG SL SUBL: 0.4000 mg | SUBLINGUAL_TABLET | SUBLINGUAL | 0 refills | Status: DC | PRN

## 2020-01-05 NOTE — Patient Instructions (Signed)
Medication Instructions:  Your physician has recommended you make the following change in your medication:   STOP Brilinta  STOP Plavix  Nitro has been refilled today.  *If you need a refill on your cardiac medications before your next appointment, please call your pharmacy*   Lab Work: Cbc, Lft today.  If you have labs (blood work) drawn today and your tests are completely normal, you will receive your results only by: Marland Kitchen MyChart Message (if you have MyChart) OR . A paper copy in the mail If you have any lab test that is abnormal or we need to change your treatment, we will call you to review the results.   Testing/Procedures: None ordered   Follow-Up: At [facility], you and your health needs are our priority.  As part of our continuing mission to provide you with exceptional heart care, we have created designated Provider Care Teams.  These Care Teams include your primary Cardiologist (physician) and Advanced Practice Providers (APPs -  Physician Assistants and Nurse Practitioners) who all work together to provide you with the care you need, when you need it.  We recommend signing up for the patient portal called "MyChart".  Sign up information is provided on this After Visit Summary.  MyChart is used to connect with patients for Virtual Visits (Telemedicine).  Patients are able to view lab/test results, encounter notes, upcoming appointments, etc.  Non-urgent messages can be sent to your provider as well.   To learn more about what you can do with MyChart, go to [email or web address].    Your next appointment:   3 month(s)  The format for your next appointment:   In Person  Provider:   You may see Yvonne Kendall, MD or one of the following Advanced Practice Providers on your designated Care Team:    Nicolasa Ducking, NP  Eula Listen, PA-C  Marisue Ivan, PA-C  Cadence Fransico Michael, [place]    Other Instructions N/A

## 2020-01-06 ENCOUNTER — Telehealth: Payer: Self-pay

## 2020-01-06 LAB — CBC WITH DIFFERENTIAL/PLATELET
Basophils Absolute: 0 10*3/uL (ref 0.0–0.2)
Basos: 1 %
EOS (ABSOLUTE): 0.4 10*3/uL (ref 0.0–0.4)
Eos: 9 %
Hematocrit: 35.2 % (ref 34.0–46.6)
Hemoglobin: 11.2 g/dL (ref 11.1–15.9)
Immature Grans (Abs): 0 10*3/uL (ref 0.0–0.1)
Immature Granulocytes: 1 %
Lymphocytes Absolute: 1.6 10*3/uL (ref 0.7–3.1)
Lymphs: 40 %
MCH: 29.3 pg (ref 26.6–33.0)
MCHC: 31.8 g/dL (ref 31.5–35.7)
MCV: 92 fL (ref 79–97)
Monocytes Absolute: 0.5 10*3/uL (ref 0.1–0.9)
Monocytes: 14 %
Neutrophils Absolute: 1.4 10*3/uL (ref 1.4–7.0)
Neutrophils: 35 %
Platelets: 223 10*3/uL (ref 150–450)
RBC: 3.82 x10E6/uL (ref 3.77–5.28)
RDW: 14.6 % (ref 11.7–15.4)
WBC: 3.9 10*3/uL (ref 3.4–10.8)

## 2020-01-06 LAB — HEPATIC FUNCTION PANEL
ALT: 27 IU/L (ref 0–32)
AST: 30 IU/L (ref 0–40)
Albumin: 3.6 g/dL (ref 3.5–4.6)
Alkaline Phosphatase: 86 IU/L (ref 44–121)
Bilirubin Total: 0.4 mg/dL (ref 0.0–1.2)
Bilirubin, Direct: 0.16 mg/dL (ref 0.00–0.40)
Total Protein: 6.2 g/dL (ref 6.0–8.5)

## 2020-01-06 MED ORDER — CLOPIDOGREL BISULFATE 75 MG PO TABS
75.0000 mg | ORAL_TABLET | Freq: Every day | ORAL | 1 refills | Status: DC
Start: 2020-01-06 — End: 2020-07-05

## 2020-01-06 NOTE — Telephone Encounter (Signed)
Spoke with [employer]. Becky sts that Nitro was picked up yesterday. Adv her that the patient needs to start plavix today and stop brilinta. Today she will take plavix 300 mg (4 tablets) and then take 75 mg daily going fwd. Rx has been sent to the patient pharmacy.  Beck verbalized understanding and will pick up plavix from the pharmacy today.

## 2020-01-06 NOTE — Telephone Encounter (Signed)
Spoke with the patients POA Lurena Joiner. Becky made aware of results and Eula Listen, PA recommendation.  Lurena Joiner sts that a medication was picked up from the patients pharmacy but she is unsure of the name of the medication.  She will call her son who picked up the med to verify the name and call back.

## 2020-01-06 NOTE — Telephone Encounter (Signed)
-----   Message from Sondra Barges, PA-C sent at 01/06/2020  7:32 AM EDT ----- Blood count normal Liver function normal  In reviewing her medication list it appears there was some confusion regarding her antiplatelet therapy.  Patient should discontinue Brilinta and start Plavix 300 mg x 1 followed by 75 mg daily thereafter.  Please add clopidogrel 75 mg daily to her medication list.  This was discussed in person with RN.  She will contact patient this morning.

## 2020-05-01 ENCOUNTER — Emergency Department
Admission: EM | Admit: 2020-05-01 | Discharge: 2020-05-01 | Disposition: A | Discharge status: Home / Self Care | Payer: Medicare HMO | Attending: Emergency Medicine | Admitting: Emergency Medicine

## 2020-05-01 ENCOUNTER — Other Ambulatory Visit: Payer: Self-pay

## 2020-05-01 DIAGNOSIS — R509 Fever, unspecified: Secondary | ICD-10-CM | POA: Diagnosis present

## 2020-05-01 DIAGNOSIS — Z7902 Long term (current) use of antithrombotics/antiplatelets: Secondary | ICD-10-CM | POA: Insufficient documentation

## 2020-05-01 DIAGNOSIS — Z20822 Contact with and (suspected) exposure to covid-19: Secondary | ICD-10-CM | POA: Diagnosis not present

## 2020-05-01 DIAGNOSIS — I2510 Atherosclerotic heart disease of native coronary artery without angina pectoris: Secondary | ICD-10-CM | POA: Insufficient documentation

## 2020-05-01 DIAGNOSIS — Z7982 Long term (current) use of aspirin: Secondary | ICD-10-CM | POA: Diagnosis not present

## 2020-05-01 DIAGNOSIS — F0390 Unspecified dementia without behavioral disturbance: Secondary | ICD-10-CM | POA: Insufficient documentation

## 2020-05-01 LAB — CBC WITH DIFFERENTIAL/PLATELET
Abs Immature Granulocytes: 0.05 10*3/uL (ref 0.00–0.07)
Basophils Absolute: 0 10*3/uL (ref 0.0–0.1)
Basophils Relative: 0 %
Eosinophils Absolute: 0 10*3/uL (ref 0.0–0.5)
Eosinophils Relative: 1 %
HCT: 37.8 % (ref 36.0–46.0)
Hemoglobin: 12.9 g/dL (ref 12.0–15.0)
Immature Granulocytes: 1 %
Lymphocytes Relative: 10 %
Lymphs Abs: 0.8 10*3/uL (ref 0.7–4.0)
MCH: 29.8 pg (ref 26.0–34.0)
MCHC: 34.1 g/dL (ref 30.0–36.0)
MCV: 87.3 fL (ref 80.0–100.0)
Monocytes Absolute: 1.1 10*3/uL — ABNORMAL HIGH (ref 0.1–1.0)
Monocytes Relative: 15 %
Neutro Abs: 5.6 10*3/uL (ref 1.7–7.7)
Neutrophils Relative %: 73 %
Platelets: 275 10*3/uL (ref 150–400)
RBC: 4.33 MIL/uL (ref 3.87–5.11)
RDW: 13.3 % (ref 11.5–15.5)
WBC: 7.6 10*3/uL (ref 4.0–10.5)
nRBC: 0 % (ref 0.0–0.2)

## 2020-05-01 LAB — COMPREHENSIVE METABOLIC PANEL
ALT: 19 U/L (ref 0–44)
AST: 23 U/L (ref 15–41)
Albumin: 3.3 g/dL — ABNORMAL LOW (ref 3.5–5.0)
Alkaline Phosphatase: 66 U/L (ref 38–126)
Anion gap: 12 (ref 5–15)
BUN: 10 mg/dL (ref 8–23)
CO2: 32 mmol/L (ref 22–32)
Calcium: 8.6 mg/dL — ABNORMAL LOW (ref 8.9–10.3)
Chloride: 95 mmol/L — ABNORMAL LOW (ref 98–111)
Creatinine, Ser: 0.43 mg/dL — ABNORMAL LOW (ref 0.44–1.00)
GFR, Estimated: >60 mL/min (ref >60)
Glucose, Bld: 130 mg/dL — ABNORMAL HIGH (ref 70–99)
Potassium: 2.6 mmol/L — CL (ref 3.5–5.1)
Sodium: 139 mmol/L (ref 135–145)
Total Bilirubin: 2 mg/dL — ABNORMAL HIGH (ref 0.3–1.2)
Total Protein: 7.3 g/dL (ref 6.5–8.1)

## 2020-05-01 LAB — URINALYSIS, COMPLETE (UACMP) WITH MICROSCOPIC
Bacteria, UA: NONE SEEN
Bilirubin Urine: NEGATIVE
Glucose, UA: NEGATIVE mg/dL
Hgb urine dipstick: NEGATIVE
Ketones, ur: NEGATIVE mg/dL
Leukocytes,Ua: NEGATIVE
Nitrite: NEGATIVE
Protein, ur: NEGATIVE mg/dL
Specific Gravity, Urine: 1.013 (ref 1.005–1.030)
pH: 8 (ref 5.0–8.0)

## 2020-05-01 LAB — POC SARS CORONAVIRUS 2 AG -  ED: SARS Coronavirus 2 Ag: NEGATIVE

## 2020-05-01 MED ORDER — SODIUM CHLORIDE 0.9 % IV BOLUS
1000.0000 mL | Freq: Once | INTRAVENOUS | Status: AC
  Administered 2020-05-01: 1000 mL via INTRAVENOUS

## 2020-05-01 MED ORDER — POTASSIUM CHLORIDE 20 MEQ PO PACK
40.0000 meq | PACK | Freq: Once | ORAL | Status: AC
  Administered 2020-05-01: 40 meq via ORAL
  Filled 2020-05-01: qty 2

## 2020-05-01 NOTE — ED Notes (Signed)
Pt dressed and went over paperwork with sister in law.  Pt taken out in [facility]

## 2020-05-01 NOTE — ED Triage Notes (Signed)
Per EMS report, patient arrived from home. Home Health called the EMS for increasing altered mental status and fever. EMS reports home health stated that her urine"smelled strong."  CBG 203 100.3 axillary temp 80 pulse 160/80 95% on room air Normal sinus rhythm   EMT placed an 18 gauge in the Left A [facility] which the patient pulled out. Patient has a history of dementia.

## 2020-05-01 NOTE — ED Notes (Signed)
Attempt I&O cath x2 without success, urinary meatus not visualized

## 2020-05-01 NOTE — ED Notes (Signed)
Unable to I&O cath.  Cleaned pt and placed new clean purewick, will collect urine once it is produced

## 2020-05-01 NOTE — ED Notes (Signed)
Date and time results received: 05/01/20 4:58 PM   Test: K Critical Value: 2.6 Name of Provider Notified: Scotty Court MD by direct message

## 2020-05-01 NOTE — ED Provider Notes (Signed)
[facility] Emergency Department Provider Note  ____________________________________________  Time seen: Approximately 7:26 PM  I have reviewed the triage vital signs and the nursing notes.   HISTORY  Chief Complaint Fever    Level 5 Caveat: Portions of the History and Physical including HPI and review of systems are unable to be completely obtained due to chronic dementia  HPI ANALLELY [name] is a 87 y.o. female with a history of dementia, prior stroke, hyperlipidemia, CAD who is brought to the ED due to increased confusion and fever earlier today.  No other specific symptoms, no reported vomiting or diarrhea, no pain complaints.      Past Medical History:  Diagnosis Date  . CAD (coronary artery disease)    a.  Inferolateral STEMI 09/02/2019  . Dementia (HCC)   . History of CVA (cerebrovascular accident)   . Hyperlipidemia LDL goal <70      Patient Active Problem List   Diagnosis Date Noted  . Protein-calorie malnutrition, severe 09/08/2019  . STEMI involving right coronary artery (HCC) 09/03/2019  . STEMI (ST elevation myocardial infarction) (HCC) 09/02/2019     Past Surgical History:  Procedure Laterality Date  . CORONARY/GRAFT ACUTE MI REVASCULARIZATION N/A 09/02/2019   Procedure: Coronary/Graft Acute MI Revascularization;  Surgeon: Yvonne Kendall, MD;  Location: [facility] INVASIVE CV LAB;  Service: Cardiovascular;  Laterality: N/A;  . LEFT HEART CATH AND CORONARY ANGIOGRAPHY N/A 09/02/2019   Procedure: LEFT HEART CATH AND CORONARY ANGIOGRAPHY;  Surgeon: Yvonne Kendall, MD;  Location: [facility] INVASIVE CV LAB;  Service: Cardiovascular;  Laterality: N/A;     Prior to Admission medications   Medication Sig Start Date End Date Taking? Authorizing Provider  aspirin 81 MG chewable tablet Chew 1 tablet (81 mg total) by mouth daily. 09/17/19   Tresa Moore, MD  atorvastatin (LIPITOR) 80 MG tablet Take 1 tablet (80 mg total) by mouth daily.  09/17/19   Tresa Moore, MD  clopidogrel (PLAVIX) 75 MG tablet Take 1 tablet (75 mg total) by mouth daily. 01/06/20   Dunn, Raymon Mutton, PA-C  nitroGLYCERIN (NITROSTAT) 0.4 MG SL tablet Place 1 tablet (0.4 mg total) under the tongue every 5 (five) minutes as needed for chest pain. 01/05/20   Sondra Barges, PA-C     Allergies Patient has no known allergies.   No family history on file.  Social History Social History   Tobacco Use  . Smoking status: Never Smoker  . Smokeless tobacco: Never Used  Substance Use Topics  . Alcohol use: Never    Review of Systems Level 5 Caveat: Portions of the History and Physical including HPI and review of systems are unable to be completely obtained due to patient being a poor historian   Constitutional: Positive fever.  ENT:   No rhinorrhea. Cardiovascular:   No chest pain or syncope. Respiratory:   No dyspnea or cough. Gastrointestinal:   Negative for abdominal pain, vomiting and diarrhea.  Musculoskeletal:   Negative for focal pain or swelling ____________________________________________   PHYSICAL EXAM:  VITAL SIGNS: ED Triage Vitals  Enc Vitals Group     BP 05/01/20 1446 (!) 151/84     Pulse Rate 05/01/20 1446 76     Resp 05/01/20 1446 18     Temp 05/01/20 1446 (!) 100.5 F (38.1 C)     Temp Source 05/01/20 1446 Rectal     SpO2 05/01/20 1446 97 %     Weight 05/01/20 1448 100 lb 15.5 oz (45.8 kg)  Height 05/01/20 1448 5\' 2"  (1.575 m)     Head Circumference --      Peak Flow --      Pain Score --      Pain Loc --      Pain Edu? --      Excl. in GC? --     Vital signs reviewed, nursing assessments reviewed.   Constitutional:   Alert and oriented to self. Non-toxic appearance. Eyes:   Conjunctivae are normal. EOMI. PERRL. ENT      Head:   Normocephalic and atraumatic.      Nose:   No congestion/rhinnorhea.       Mouth/Throat:   MMM, no pharyngeal erythema. No peritonsillar mass.       Neck:   No meningismus. Full  ROM. Hematological/Lymphatic/Immunilogical:   No cervical lymphadenopathy. Cardiovascular:   RRR. Symmetric bilateral radial and DP pulses.  No murmurs. Cap refill less than 2 seconds. Respiratory:   Normal respiratory effort without tachypnea/retractions. Breath sounds are clear and equal bilaterally. No wheezes/rales/rhonchi. Gastrointestinal:   Soft and nontender. Non distended. There is no CVA tenderness.  No rebound, rigidity, or guarding. Musculoskeletal:   Normal range of motion in all extremities. No joint effusions.  No lower extremity tenderness.  No edema. Neurologic:   Normal speech, limited language expression Motor grossly intact. No acute focal neurologic deficits are appreciated.  Skin:    Skin is warm, dry and intact. No rash noted.  No petechiae, purpura, or bullae.  ____________________________________________    LABS (pertinent positives/negatives) (all labs ordered are listed, but only abnormal results are displayed) Labs Reviewed  URINALYSIS, COMPLETE (UACMP) WITH MICROSCOPIC - Abnormal; Notable for the following components:      Result Value   Color, Urine YELLOW (*)    APPearance CLEAR (*)    All other components within normal limits  COMPREHENSIVE METABOLIC PANEL - Abnormal; Notable for the following components:   Potassium 2.6 (*)    Chloride 95 (*)    Glucose, Bld 130 (*)    Creatinine, Ser 0.43 (*)    Calcium 8.6 (*)    Albumin 3.3 (*)    Total Bilirubin 2.0 (*)    All other components within normal limits  CBC WITH DIFFERENTIAL/PLATELET - Abnormal; Notable for the following components:   Monocytes Absolute 1.1 (*)    All other components within normal limits  URINE CULTURE  SARS CORONAVIRUS 2 (TAT 6-24 HRS)  POC SARS CORONAVIRUS 2 AG -  ED   ____________________________________________   EKG  Interpreted by me Sinus rhythm rate of 68, normal axis and intervals.  Normal QRS ST segments and T  waves.  ____________________________________________    RADIOLOGY  No results found.  ____________________________________________   PROCEDURES Procedures  ____________________________________________  DIFFERENTIAL DIAGNOSIS   UTI, electrolyte abnormality, chronic dementia, viral illness/COVID, dehydration  CLINICAL IMPRESSION / ASSESSMENT AND PLAN / ED COURSE  Medications ordered in the ED: Medications  sodium chloride 0.9 % bolus 1,000 mL (0 mLs Intravenous Stopped 05/01/20 1855)  potassium chloride (KLOR-CON) packet 40 mEq (40 mEq Oral Given 05/01/20 1903)    Pertinent labs & imaging results that were available during my care of the patient were reviewed by me and considered in my medical decision making (see chart for details).   Elizabeth [name] was evaluated in Emergency Department on 05/01/2020 for the symptoms described in the history of present illness. She was evaluated in the context of the global COVID-19 pandemic, which necessitated consideration that  the patient might be at risk for infection with the SARS-CoV-2 virus that causes COVID-19. Institutional protocols and algorithms that pertain to the evaluation of patients at risk for COVID-19 are in a state of rapid change based on information released by regulatory bodies including the CDC and federal and state organizations. These policies and algorithms were followed during the patient's care in the ED.   Patient with chronic dementia comes with confusion.  Family when they arrived to bedside state that the patient is at baseline mental status.  Only abnormality noted is the low-grade fever of 100.5.  Labs are reassuring except for potassium of 2.6.  No AKI or uremia, CBC is normal, urinalysis is normal, COVID antigen is negative.  COVID PCR sent to lab.  Vital signs remained stable and normal.  Suspect viral illness, respiratory exam is normal, stable for discharge home to follow-up with PCP.  Patient given  potassium supplementation in the ED.      ____________________________________________   FINAL CLINICAL IMPRESSION(S) / ED DIAGNOSES    Final diagnoses:  Fever, unspecified fever cause  Chronic dementia without behavioral disturbance [facility])     ED Discharge Orders    None      Portions of this note were generated with dragon dictation software. Dictation errors may occur despite best attempts at proofreading.   Sharman Cheek, MD 05/01/20 [phone]

## 2020-05-02 LAB — SARS CORONAVIRUS 2 (TAT 6-24 HRS): SARS Coronavirus 2: NEGATIVE

## 2020-05-03 LAB — URINE CULTURE: Culture: NO GROWTH

## 2020-05-05 ENCOUNTER — Encounter: Payer: Self-pay | Admitting: Internal Medicine

## 2020-05-05 ENCOUNTER — Ambulatory Visit (INDEPENDENT_AMBULATORY_CARE_PROVIDER_SITE_OTHER): Payer: Medicare HMO | Admitting: Internal Medicine

## 2020-05-05 ENCOUNTER — Other Ambulatory Visit: Payer: Self-pay

## 2020-05-05 VITALS — BP 108/64 | HR 66 | Ht 59.0 in | Wt 106.1 lb

## 2020-05-05 DIAGNOSIS — E876 Hypokalemia: Secondary | ICD-10-CM

## 2020-05-05 DIAGNOSIS — E785 Hyperlipidemia, unspecified: Secondary | ICD-10-CM | POA: Diagnosis not present

## 2020-05-05 DIAGNOSIS — I2510 Atherosclerotic heart disease of native coronary artery without angina pectoris: Secondary | ICD-10-CM

## 2020-05-05 NOTE — Progress Notes (Unsigned)
Follow-up Outpatient Visit Date: 05/05/2020  Primary Care Provider: Barbette Reichmann, MD [address] [place] [address]  Chief Complaint: Follow-up CAD  HPI:  Ms. Gates is a 87 y.o. female with history of coronary artery disease with inferolateral STEMI in 08/2019 status post PCI to the RCA, hyperlipidemia, stroke, and dementia, who presents for follow-up of CAD.  She was last seen in our office in late September by Eula Listen, PA, at which time she was doing well from a cardiac perspective.  She was transitioned from ticagrelor to clopidogrel.  She was seen in the ED a few days ago with low-grade fever and baseline confusion.  Hypokalemia with potassium of 2.6 was noted.  Work-up was otherwise unrevealing.  Patient was given potassium and discharged home with recommendations to follow-up with her PCP.  COVID-19 was negative.  She is gradually improving in regard to her energy and appetite.  She has continued to have loose stools, though they too seem to be getting better.  Ms. Amerman denies chest pain, shortness of breath, palpitations, lightheadedness, and edema.  Her caregiver is concerned about her ability to swallow atorvastatin due to size of the pill.  The patient does not like to take it crushed because it tastes bad.  --------------------------------------------------------------------------------------------------  Past Medical History:  Diagnosis Date  . CAD (coronary artery disease)    a.  Inferolateral STEMI 09/02/2019  . Dementia (HCC)   . History of CVA (cerebrovascular accident)   . Hyperlipidemia LDL goal <70    Past Surgical History:  Procedure Laterality Date  . CORONARY/GRAFT ACUTE MI REVASCULARIZATION N/A 09/02/2019   Procedure: Coronary/Graft Acute MI Revascularization;  Surgeon: Yvonne Kendall, MD;  Location: [facility] INVASIVE CV LAB;  Service: Cardiovascular;  Laterality: N/A;  . LEFT HEART CATH AND CORONARY ANGIOGRAPHY N/A  09/02/2019   Procedure: LEFT HEART CATH AND CORONARY ANGIOGRAPHY;  Surgeon: Yvonne Kendall, MD;  Location: [facility] INVASIVE CV LAB;  Service: Cardiovascular;  Laterality: N/A;    Current Meds  Medication Sig  . aspirin 81 MG chewable tablet Chew 1 tablet (81 mg total) by mouth daily.  Marland Kitchen atorvastatin (LIPITOR) 80 MG tablet Take 1 tablet (80 mg total) by mouth daily.  . clopidogrel (PLAVIX) 75 MG tablet Take 1 tablet (75 mg total) by mouth daily.  . Cyanocobalamin (B-12 PO) Take by mouth daily at 12 noon.  . nitroGLYCERIN (NITROSTAT) 0.4 MG SL tablet Place 1 tablet (0.4 mg total) under the tongue every 5 (five) minutes as needed for chest pain.    Allergies: Patient has no known allergies.  Social History   Tobacco Use  . Smoking status: Never Smoker  . Smokeless tobacco: Never Used  Substance Use Topics  . Alcohol use: Never    History reviewed. No pertinent family history.  Review of Systems: A 12-system review of systems was performed and was negative except as noted in the HPI.  --------------------------------------------------------------------------------------------------  Physical Exam: BP 108/64 (BP Location: Left Arm, Patient Position: Sitting, Cuff Size: Normal)   Pulse 66   Ht 4\' 11"  (1.499 m)   Wt 106 lb 2 oz (48.1 kg)   SpO2 96%   BMI 21.43 kg/m   General:  Frail, elderly woman seated in a wheelchair. Neck: No JVD or HJR. Lungs: Clear to auscultation bilaterally without wheezes or crackles. Heart: Regular rate and rhythm with 2/6 systolic murmur. Abdomen: Soft, nontender, nondistended. Extremities: No lower extremity edema.  EKG:  Normal sinus rhythm with nonspecific ST/T changes.  Compared with prior tracing from 05/01/2020, nonspecific ST/T changes are more pronounced.  Lab Results  Component Value Date   WBC 7.6 05/01/2020   HGB 12.9 05/01/2020   HCT 37.8 05/01/2020   MCV 87.3 05/01/2020   PLT 275 05/01/2020    Lab Results  Component Value Date    NA 145 (H) 05/05/2020   K 3.4 (L) 05/05/2020   CL 98 05/05/2020   CO2 27 05/05/2020   BUN 12 05/05/2020   CREATININE 0.80 05/05/2020   GLUCOSE 129 (H) 05/05/2020   ALT 19 05/01/2020    Lab Results  Component Value Date   CHOL 132 05/05/2020   HDL 48 05/05/2020   LDLCALC 68 05/05/2020   TRIG 85 05/05/2020   CHOLHDL 2.8 05/05/2020    --------------------------------------------------------------------------------------------------  ASSESSMENT AND PLAN: CAD: No symptoms to suggest worsening coronary insufficiency, though underlying dementia may limit patient's ability to report symptoms.  We will complete 12 months of DAPT with ASA and clopidogrel.  Hyperlipidemia: Ms. Suppa caregiver reports that the patient has difficulty swallowing atorvastatin due to size of the pill.  We will recheck a lipid panel today.  If her LDL is very well-controlled, we could consider decreasing atorvastatin to a smaller dose.  Otherwise, we may need to consider switching to rosuvastatin 20 mg daily, as this may be easier for her to swallow.  Hypokalemia: Recent hypokalemia noted in the ED in the setting of fevers and diarrhea.  I will recheck a BMP today to ensure that this has normalized.  Follow-up: Return to clinic in 4 months.  Yvonne Kendall, MD 05/06/2020 11:24 AM

## 2020-05-05 NOTE — Patient Instructions (Signed)
Medication Instructions:  Your physician recommends that you continue on your current medications as directed. Please refer to the Current Medication list given to you today.  *If you need a refill on your cardiac medications before your next appointment, please call your pharmacy*  Lab Work: Your physician recommends that you return for lab work in: TODAY - LIPID, BMET.   If you have labs (blood work) drawn today and your tests are completely normal, you will receive your results only by: Marland Kitchen MyChart Message (if you have MyChart) OR . A paper copy in the mail If you have any lab test that is abnormal or we need to change your treatment, we will call you to review the results.   Testing/Procedures: none  Follow-Up: At [facility], you and your health needs are our priority.  As part of our continuing mission to provide you with exceptional heart care, we have created designated Provider Care Teams.  These Care Teams include your primary Cardiologist (physician) and Advanced Practice Providers (APPs -  Physician Assistants and Nurse Practitioners) who all work together to provide you with the care you need, when you need it.  We recommend signing up for the patient portal called "MyChart".  Sign up information is provided on this After Visit Summary.  MyChart is used to connect with patients for Virtual Visits (Telemedicine).  Patients are able to view lab/test results, encounter notes, upcoming appointments, etc.  Non-urgent messages can be sent to your provider as well.   To learn more about what you can do with MyChart, go to [email or web address].    Your next appointment:   4 month(s) in May  The format for your next appointment:   In Person  Provider:   You may see Yvonne Kendall, MD or one of the following Advanced Practice Providers on your designated Care Team:    Nicolasa Ducking, NP  Eula Listen, PA-C  Marisue Ivan, PA-C  Cadence [place], [place]  Gillian Shields,  NP

## 2020-05-06 ENCOUNTER — Encounter: Payer: Self-pay | Admitting: Internal Medicine

## 2020-05-06 DIAGNOSIS — E785 Hyperlipidemia, unspecified: Secondary | ICD-10-CM | POA: Insufficient documentation

## 2020-05-06 DIAGNOSIS — E876 Hypokalemia: Secondary | ICD-10-CM | POA: Insufficient documentation

## 2020-05-06 DIAGNOSIS — I2510 Atherosclerotic heart disease of native coronary artery without angina pectoris: Secondary | ICD-10-CM | POA: Insufficient documentation

## 2020-05-06 LAB — LIPID PANEL
Chol/HDL Ratio: 2.8 ratio (ref 0.0–4.4)
Cholesterol, Total: 132 mg/dL (ref 100–199)
HDL: 48 mg/dL (ref >39)
LDL Chol Calc (NIH): 68 mg/dL (ref 0–99)
Triglycerides: 85 mg/dL (ref 0–149)
VLDL Cholesterol Cal: 16 mg/dL (ref 5–40)

## 2020-05-06 LAB — BASIC METABOLIC PANEL
BUN/Creatinine Ratio: 15 (ref 12–28)
BUN: 12 mg/dL (ref 10–36)
CO2: 27 mmol/L (ref 20–29)
Calcium: 9.5 mg/dL (ref 8.7–10.3)
Chloride: 98 mmol/L (ref 96–106)
Creatinine, Ser: 0.8 mg/dL (ref 0.57–1.00)
GFR calc Af Amer: 75 mL/min/{1.73_m2} (ref >59)
GFR calc non Af Amer: 65 mL/min/{1.73_m2} (ref >59)
Glucose: 129 mg/dL — ABNORMAL HIGH (ref 65–99)
Potassium: 3.4 mmol/L — ABNORMAL LOW (ref 3.5–5.2)
Sodium: 145 mmol/L — ABNORMAL HIGH (ref 134–144)

## 2020-05-07 ENCOUNTER — Telehealth: Payer: Self-pay | Admitting: *Deleted

## 2020-05-07 MED ORDER — ROSUVASTATIN CALCIUM 20 MG PO TABS: 20.0000 mg | ORAL_TABLET | Freq: Every day | ORAL | 3 refills | Status: DC

## 2020-05-07 NOTE — Telephone Encounter (Signed)
Spoke to patient's POA Floreen Comber. She verbalized understanding to have the patient stop atorvastatin and start rosuvastatin in its place. She was appreciative and hopes the patient can swallow that one better. Rx sent to pharmacy.

## 2020-05-07 NOTE — Telephone Encounter (Signed)
-----   Message from Yvonne Kendall, MD sent at 05/06/2020 11:32 AM EST ----- Merrily Pew,  When I reviewed the labs for Ms. Caruthers, I noted that she should continue her current medications.  However, I remembered that she was having trouble swallowing atorvastatin due to size of the pill.  Would you mind reaching out to her caregiver to switch her to rosuvastatin 20 mg daily; I believe this pill will be a little smaller.  Thanks.  Thayer Ohm

## 2020-07-05 ENCOUNTER — Other Ambulatory Visit: Payer: Self-pay | Admitting: Physician Assistant

## 2020-07-05 NOTE — Telephone Encounter (Signed)
Rx request sent to pharmacy.  

## 2020-09-02 ENCOUNTER — Ambulatory Visit: Payer: Medicare HMO | Admitting: Internal Medicine

## 2020-09-08 ENCOUNTER — Other Ambulatory Visit: Payer: Self-pay

## 2020-09-08 ENCOUNTER — Emergency Department: Payer: Medicare HMO

## 2020-09-08 ENCOUNTER — Telehealth: Payer: Self-pay | Admitting: Internal Medicine

## 2020-09-08 ENCOUNTER — Emergency Department
Admission: EM | Admit: 2020-09-08 | Discharge: 2020-09-08 | Disposition: A | Discharge status: Home / Self Care | Payer: Medicare HMO | Source: Home / Self Care | Attending: Emergency Medicine | Admitting: Emergency Medicine

## 2020-09-08 ENCOUNTER — Ambulatory Visit: Payer: Medicare HMO | Admitting: Internal Medicine

## 2020-09-08 DIAGNOSIS — S0990XA Unspecified injury of head, initial encounter: Secondary | ICD-10-CM

## 2020-09-08 DIAGNOSIS — S161XXA Strain of muscle, fascia and tendon at neck level, initial encounter: Secondary | ICD-10-CM

## 2020-09-08 DIAGNOSIS — W01198A Fall on same level from slipping, tripping and stumbling with subsequent striking against other object, initial encounter: Secondary | ICD-10-CM | POA: Insufficient documentation

## 2020-09-08 DIAGNOSIS — F0390 Unspecified dementia without behavioral disturbance: Secondary | ICD-10-CM | POA: Insufficient documentation

## 2020-09-08 DIAGNOSIS — Y92009 Unspecified place in unspecified non-institutional (private) residence as the place of occurrence of the external cause: Secondary | ICD-10-CM | POA: Insufficient documentation

## 2020-09-08 DIAGNOSIS — J189 Pneumonia, unspecified organism: Secondary | ICD-10-CM | POA: Diagnosis not present

## 2020-09-08 DIAGNOSIS — R531 Weakness: Secondary | ICD-10-CM | POA: Diagnosis not present

## 2020-09-08 MED ORDER — CLOPIDOGREL BISULFATE 75 MG PO TABS: ORAL_TABLET | ORAL | 0 refills | Status: DC

## 2020-09-08 MED ORDER — ROSUVASTATIN CALCIUM 20 MG PO TABS: 20.0000 mg | ORAL_TABLET | Freq: Every day | ORAL | 0 refills | Status: DC

## 2020-09-08 MED ORDER — ACETAMINOPHEN 325 MG PO TABS
650.0000 mg | ORAL_TABLET | Freq: Once | ORAL | Status: AC
  Administered 2020-09-08: 650 mg via ORAL
  Filled 2020-09-08: qty 2

## 2020-09-08 NOTE — Telephone Encounter (Signed)
Requested Prescriptions   Signed Prescriptions Disp Refills   clopidogrel (PLAVIX) 75 MG tablet 90 tablet 0    Sig: Take 1 tablet PO QD    Authorizing Provider: END, CHRISTOPHER    Ordering User: NEWCOMER MCCLAIN, Domnique Vanegas L   rosuvastatin (CRESTOR) 20 MG tablet 90 tablet 0    Sig: Take 1 tablet (20 mg total) by mouth daily.    Authorizing Provider: END, CHRISTOPHER    Ordering User: Thayer Headings, Apryll Hinkle L

## 2020-09-08 NOTE — Progress Notes (Deleted)
   Follow-up Outpatient Visit Date: 09/08/2020  Primary Care Provider: Barbette Reichmann, MD [address] [place] [address]  Chief Complaint: ***  HPI:  Elizabeth [name] is a 87 y.o. female with history of coronary artery disease with inferolateral STEMI in 08/2019 status post PCI to the RCA, hyperlipidemia, stroke, and dementia, who presents for follow-up of coronary artery disease.  I last saw her in January, at which time she was feeling well.  Her caregiver was concerned about Elizabeth [name]'s ability to swallow atorvastatin due to the large pill size.  We agreed to switch her to rosuvastatin 20 mg daily.  --------------------------------------------------------------------------------------------------  Past Medical History:  Diagnosis Date  . CAD (coronary artery disease)    a.  Inferolateral STEMI 09/02/2019  . Dementia (HCC)   . History of CVA (cerebrovascular accident)   . Hyperlipidemia LDL goal <70    Past Surgical History:  Procedure Laterality Date  . CORONARY/GRAFT ACUTE MI REVASCULARIZATION N/A 09/02/2019   Procedure: Coronary/Graft Acute MI Revascularization;  Surgeon: Yvonne Kendall, MD;  Location: [facility] INVASIVE CV LAB;  Service: Cardiovascular;  Laterality: N/A;  . LEFT HEART CATH AND CORONARY ANGIOGRAPHY N/A 09/02/2019   Procedure: LEFT HEART CATH AND CORONARY ANGIOGRAPHY;  Surgeon: Yvonne Kendall, MD;  Location: [facility] INVASIVE CV LAB;  Service: Cardiovascular;  Laterality: N/A;    No outpatient medications have been marked as taking for the 09/08/20 encounter (Appointment) with Bayli Quesinberry, Elizabeth Deer, MD.    Allergies: Patient has no known allergies.  Social History   Tobacco Use  . Smoking status: Never Smoker  . Smokeless tobacco: Never Used  Substance Use Topics  . Alcohol use: Never    No family history on file.  Review of Systems: A 12-system review of systems was performed and was negative except as noted in the  HPI.  --------------------------------------------------------------------------------------------------  Physical Exam: There were no vitals taken for this visit.  General:  NAD. Neck: No JVD or HJR. Lungs: Clear to auscultation bilaterally without wheezes or crackles. Heart: Regular rate and rhythm without murmurs, rubs, or gallops. Abdomen: Soft, nontender, nondistended. Extremities: No lower extremity edema.  EKG:  ***  Lab Results  Component Value Date   WBC 7.6 05/01/2020   HGB 12.9 05/01/2020   HCT 37.8 05/01/2020   MCV 87.3 05/01/2020   PLT 275 05/01/2020    Lab Results  Component Value Date   NA 145 (H) 05/05/2020   K 3.4 (L) 05/05/2020   CL 98 05/05/2020   CO2 27 05/05/2020   BUN 12 05/05/2020   CREATININE 0.80 05/05/2020   GLUCOSE 129 (H) 05/05/2020   ALT 19 05/01/2020    Lab Results  Component Value Date   CHOL 132 05/05/2020   HDL 48 05/05/2020   LDLCALC 68 05/05/2020   TRIG 85 05/05/2020   CHOLHDL 2.8 05/05/2020    --------------------------------------------------------------------------------------------------  ASSESSMENT AND PLAN: Elizabeth Deer Irianna Gilday, MD 09/08/2020 7:28 AM

## 2020-09-08 NOTE — ED Triage Notes (Signed)
Pt presents to the [facility] via EMS from home with c/o fall with head injury. EMS states that fall was witnessed by caregiver at home where pt lost her balance then fell and hit her head. EMS denies any blood thinner use. No obvious signs of trauma noted at this time. Vital signs WNL and currently at baseline mental status (hx of dementia).

## 2020-09-08 NOTE — ED Provider Notes (Signed)
[facility] Emergency Department Provider Note   ____________________________________________   Event Date/Time   First MD Initiated Contact with Patient 09/08/20 1442     (approximate)  I have reviewed the triage vital signs and the nursing notes.   HISTORY  Chief Complaint Fall and Head Injury    HPI Elizabeth [name] is a 87 y.o. female patient arrived via EMS secondary to a fall which occurred at home.  This was a witnessed event by care giver.  Caregiver stated patient also balance and fell hit her head.  Denies LOC.  Patient does not take blood thinners.  History given by caretaker secondary to patient dementia.         No past medical history on file.  There are no problems to display for this patient.     Prior to Admission medications   Not on File    Allergies Patient has no known allergies.  No family history on file.  Social History    Review of Systems Constitutional: No fever/chills Eyes: No visual changes. ENT: No sore throat. Cardiovascular: Denies chest pain. Respiratory: Denies shortness of breath. Gastrointestinal: No abdominal pain.  No nausea, no vomiting.  No diarrhea.  No constipation. Genitourinary: Negative for dysuria. Musculoskeletal: Positive for neck pain.   Skin: Negative for rash. Neurological: Negative for headaches, focal weakness or numbness.   ____________________________________________   PHYSICAL EXAM:  VITAL SIGNS: ED Triage Vitals  Enc Vitals Group     BP 09/08/20 1412 (!) 163/69     Pulse Rate 09/08/20 1412 77     Resp 09/08/20 1412 16     Temp 09/08/20 1412 97.8 F (36.6 C)     Temp Source 09/08/20 1412 Oral     SpO2 09/08/20 1412 93 %     Weight 09/08/20 1413 115 lb (52.2 kg)     Height 09/08/20 1413 5\' 2"  (1.575 m)     Head Circumference --      Peak Flow --      Pain Score 09/08/20 1412 4     Pain Loc --      Pain Edu? --      Excl. in GC? --     Constitutional: Alert  and oriented. Well appearing and in no acute distress. Eyes: Conjunctivae are normal. PERRL. EOMI. Head: Atraumatic.  No hematoma. Nose: No congestion/rhinnorhea. Mouth/Throat: Mucous membranes are moist.  Oropharynx non-erythematous. Neck: No stridor.  No cervical spine tenderness to palpation. Hematological/Lymphatic/Immunilogical: No cervical lymphadenopathy. Cardiovascular: Normal rate, regular rhythm. Grossly normal heart sounds.  Good peripheral circulation. Respiratory: Normal respiratory effort.  No retractions. Lungs CTAB. Gastrointestinal: Soft and nontender. No distention. No abdominal bruits. No CVA tenderness. Genitourinary: Deferred Musculoskeletal: No lower extremity tenderness nor edema.  No joint effusions. Neurologic:  Normal speech and language. No gross focal neurologic deficits are appreciated. No gait instability. Skin:  Skin is warm, dry and intact. No rash noted.  No abrasion or ecchymosis. Psychiatric: Mood and affect are normal. Speech and behavior are normal.  ____________________________________________   LABS (all labs ordered are listed, but only abnormal results are displayed)  Labs Reviewed - No data to display ____________________________________________  EKG  ____________________________________________  RADIOLOGY I, 11/08/20, personally viewed and evaluated these images (plain radiographs) as part of my medical decision making, as well as reviewing the written report by the radiologist.  ED MD interpretation:    Official radiology report(s): CT Head Wo Contrast  Result Date: 09/08/2020 CLINICAL DATA:  Fall. EXAM: CT HEAD WITHOUT CONTRAST CT CERVICAL SPINE WITHOUT CONTRAST TECHNIQUE: Multidetector CT imaging of the head and cervical spine was performed following the standard protocol without intravenous contrast. Multiplanar CT image reconstructions of the cervical spine were also generated. COMPARISON:  None. FINDINGS: CT HEAD FINDINGS  Brain: Mild chronic ischemic white matter disease. No mass effect or midline shift is noted. Ventricular size is within normal limits. There is no evidence of mass lesion, hemorrhage or acute infarction. Vascular: No hyperdense vessel or unexpected calcification. Skull: Normal. Negative for fracture or focal lesion. Sinuses/Orbits: No acute finding. Other: None. CT CERVICAL SPINE FINDINGS Alignment: Mild grade 1 anterolisthesis of C7-T1 is noted secondary to posterior facet joint hypertrophy. Skull base and vertebrae: No acute fracture. No primary bone lesion or focal pathologic process. Soft tissues and spinal canal: No prevertebral fluid or swelling. No visible canal hematoma. Disc levels: Moderate to severe degenerative disc disease is noted at C3-4, C4-5, C5-6 and C6-7. Upper chest: Negative. Other: None. IMPRESSION: No acute intracranial abnormality seen. Multilevel degenerative disc disease. No acute abnormality is noted in the cervical spine. Electronically Signed   By: Lupita Raider M.D.   On: 09/08/2020 15:20   CT Cervical Spine Wo Contrast  Result Date: 09/08/2020 CLINICAL DATA:  Fall. EXAM: CT HEAD WITHOUT CONTRAST CT CERVICAL SPINE WITHOUT CONTRAST TECHNIQUE: Multidetector CT imaging of the head and cervical spine was performed following the standard protocol without intravenous contrast. Multiplanar CT image reconstructions of the cervical spine were also generated. COMPARISON:  None. FINDINGS: CT HEAD FINDINGS Brain: Mild chronic ischemic white matter disease. No mass effect or midline shift is noted. Ventricular size is within normal limits. There is no evidence of mass lesion, hemorrhage or acute infarction. Vascular: No hyperdense vessel or unexpected calcification. Skull: Normal. Negative for fracture or focal lesion. Sinuses/Orbits: No acute finding. Other: None. CT CERVICAL SPINE FINDINGS Alignment: Mild grade 1 anterolisthesis of C7-T1 is noted secondary to posterior facet joint  hypertrophy. Skull base and vertebrae: No acute fracture. No primary bone lesion or focal pathologic process. Soft tissues and spinal canal: No prevertebral fluid or swelling. No visible canal hematoma. Disc levels: Moderate to severe degenerative disc disease is noted at C3-4, C4-5, C5-6 and C6-7. Upper chest: Negative. Other: None. IMPRESSION: No acute intracranial abnormality seen. Multilevel degenerative disc disease. No acute abnormality is noted in the cervical spine. Electronically Signed   By: Lupita Raider M.D.   On: 09/08/2020 15:20    ____________________________________________   PROCEDURES  Procedure(s) performed (including Critical Care):  Procedures   ____________________________________________   INITIAL IMPRESSION / ASSESSMENT AND PLAN / ED COURSE  As part of my medical decision making, I reviewed the following data within the electronic MEDICAL RECORD NUMBER         Patient presents for evaluation secondary to a fall which she had the back of her head.  Discussed no acute findings on CT of the head and neck.  Patient complaint physical exam consistent with minor head injury secondary to fall.  Caregivers given discharge care instructions and advised return med ED if condition worsens.      ____________________________________________   FINAL CLINICAL IMPRESSION(S) / ED DIAGNOSES  Final diagnoses:  Minor head injury, initial encounter  Acute strain of neck muscle, initial encounter     ED Discharge Orders    None       Note:  This document was prepared using Dragon voice recognition software and may include unintentional dictation errors.  Joni Reining, PA-C 09/08/20 1548    Minna Antis, MD 09/10/20 [phone]

## 2020-09-08 NOTE — Telephone Encounter (Signed)
*  STAT* If patient is at the pharmacy, call can be transferred to refill team.   1. Which medications need to be refilled? (please list name of each medication and dose if known) clopidogrel, rosuvastatin   2. Which pharmacy/location (including street and city if local pharmacy) is medication to be sent to? [facility] [address]  3. Do they need a 30 day or 90 day supply? 90

## 2020-09-08 NOTE — Discharge Instructions (Signed)
No acute findings on CT of the head and cervical spine.  Advise over-the-counter Tylenol as needed for pain.

## 2020-09-10 ENCOUNTER — Encounter: Payer: Self-pay | Admitting: Emergency Medicine

## 2020-09-10 ENCOUNTER — Emergency Department: Payer: Medicare HMO

## 2020-09-10 ENCOUNTER — Inpatient Hospital Stay
Admission: EM | Admit: 2020-09-10 | Discharge: 2020-09-14 | DRG: 193 | Disposition: A | Discharge status: Hospice | Payer: Medicare HMO | Attending: Internal Medicine | Admitting: Internal Medicine

## 2020-09-10 ENCOUNTER — Other Ambulatory Visit: Payer: Self-pay

## 2020-09-10 DIAGNOSIS — J189 Pneumonia, unspecified organism: Principal | ICD-10-CM

## 2020-09-10 DIAGNOSIS — Z951 Presence of aortocoronary bypass graft: Secondary | ICD-10-CM

## 2020-09-10 DIAGNOSIS — E872 Acidosis: Secondary | ICD-10-CM | POA: Diagnosis present

## 2020-09-10 DIAGNOSIS — Z8673 Personal history of transient ischemic attack (TIA), and cerebral infarction without residual deficits: Secondary | ICD-10-CM

## 2020-09-10 DIAGNOSIS — Z79899 Other long term (current) drug therapy: Secondary | ICD-10-CM

## 2020-09-10 DIAGNOSIS — Z66 Do not resuscitate: Secondary | ICD-10-CM | POA: Diagnosis present

## 2020-09-10 DIAGNOSIS — I252 Old myocardial infarction: Secondary | ICD-10-CM

## 2020-09-10 DIAGNOSIS — R4182 Altered mental status, unspecified: Secondary | ICD-10-CM

## 2020-09-10 DIAGNOSIS — R0902 Hypoxemia: Secondary | ICD-10-CM

## 2020-09-10 DIAGNOSIS — Z20822 Contact with and (suspected) exposure to covid-19: Secondary | ICD-10-CM | POA: Diagnosis present

## 2020-09-10 DIAGNOSIS — E785 Hyperlipidemia, unspecified: Secondary | ICD-10-CM | POA: Diagnosis present

## 2020-09-10 DIAGNOSIS — I639 Cerebral infarction, unspecified: Secondary | ICD-10-CM

## 2020-09-10 DIAGNOSIS — R778 Other specified abnormalities of plasma proteins: Secondary | ICD-10-CM | POA: Diagnosis present

## 2020-09-10 DIAGNOSIS — G8191 Hemiplegia, unspecified affecting right dominant side: Secondary | ICD-10-CM | POA: Diagnosis present

## 2020-09-10 DIAGNOSIS — I2510 Atherosclerotic heart disease of native coronary artery without angina pectoris: Secondary | ICD-10-CM | POA: Diagnosis present

## 2020-09-10 DIAGNOSIS — R296 Repeated falls: Secondary | ICD-10-CM | POA: Diagnosis present

## 2020-09-10 DIAGNOSIS — J9601 Acute respiratory failure with hypoxia: Secondary | ICD-10-CM | POA: Diagnosis present

## 2020-09-10 DIAGNOSIS — W19XXXA Unspecified fall, initial encounter: Principal | ICD-10-CM

## 2020-09-10 DIAGNOSIS — R531 Weakness: Secondary | ICD-10-CM

## 2020-09-10 DIAGNOSIS — S42211A Unspecified displaced fracture of surgical neck of right humerus, initial encounter for closed fracture: Secondary | ICD-10-CM

## 2020-09-10 DIAGNOSIS — E876 Hypokalemia: Secondary | ICD-10-CM | POA: Diagnosis present

## 2020-09-10 DIAGNOSIS — R7989 Other specified abnormal findings of blood chemistry: Secondary | ICD-10-CM

## 2020-09-10 DIAGNOSIS — Z7982 Long term (current) use of aspirin: Secondary | ICD-10-CM

## 2020-09-10 DIAGNOSIS — Z515 Encounter for palliative care: Secondary | ICD-10-CM

## 2020-09-10 DIAGNOSIS — G309 Alzheimer's disease, unspecified: Secondary | ICD-10-CM | POA: Diagnosis present

## 2020-09-10 DIAGNOSIS — A419 Sepsis, unspecified organism: Secondary | ICD-10-CM

## 2020-09-10 DIAGNOSIS — Z7902 Long term (current) use of antithrombotics/antiplatelets: Secondary | ICD-10-CM

## 2020-09-10 DIAGNOSIS — F0280 Dementia in other diseases classified elsewhere without behavioral disturbance: Secondary | ICD-10-CM | POA: Diagnosis present

## 2020-09-10 LAB — CBC WITH DIFFERENTIAL/PLATELET
Abs Immature Granulocytes: 0.11 10*3/uL — ABNORMAL HIGH (ref 0.00–0.07)
Basophils Absolute: 0 10*3/uL (ref 0.0–0.1)
Basophils Relative: 0 %
Eosinophils Absolute: 0 10*3/uL (ref 0.0–0.5)
Eosinophils Relative: 0 %
HCT: 36.4 % (ref 36.0–46.0)
Hemoglobin: 12.3 g/dL (ref 12.0–15.0)
Immature Granulocytes: 1 %
Lymphocytes Relative: 9 %
Lymphs Abs: 1.1 10*3/uL (ref 0.7–4.0)
MCH: 30.7 pg (ref 26.0–34.0)
MCHC: 33.8 g/dL (ref 30.0–36.0)
MCV: 90.8 fL (ref 80.0–100.0)
Monocytes Absolute: 1.7 10*3/uL — ABNORMAL HIGH (ref 0.1–1.0)
Monocytes Relative: 13 %
Neutro Abs: 9.5 10*3/uL — ABNORMAL HIGH (ref 1.7–7.7)
Neutrophils Relative %: 77 %
Platelets: 198 10*3/uL (ref 150–400)
RBC: 4.01 MIL/uL (ref 3.87–5.11)
RDW: 15.1 % (ref 11.5–15.5)
WBC: 12.4 10*3/uL — ABNORMAL HIGH (ref 4.0–10.5)
nRBC: 0 % (ref 0.0–0.2)

## 2020-09-10 IMAGING — CT CT HEAD W/O CM
3 series · 15 of 46 positions shown, 18 images · non-contrast
Comparison: [DATE]

CLINICAL DATA: Fall and hyperglycemia

EXAM:
CT HEAD WITHOUT CONTRAST
TECHNIQUE: Contiguous axial images were obtained from the base of the skull
through the vertex without intravenous contrast.

[Series 3: head wo · axial · 0.42mm/px · z∈[+389,+509]mm · 9 of 29 slices shown, 12 images]
[im 3/29  brain]
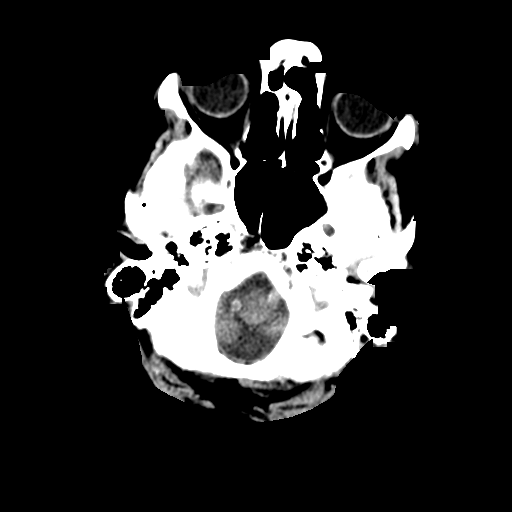
[im 3/29  bone]
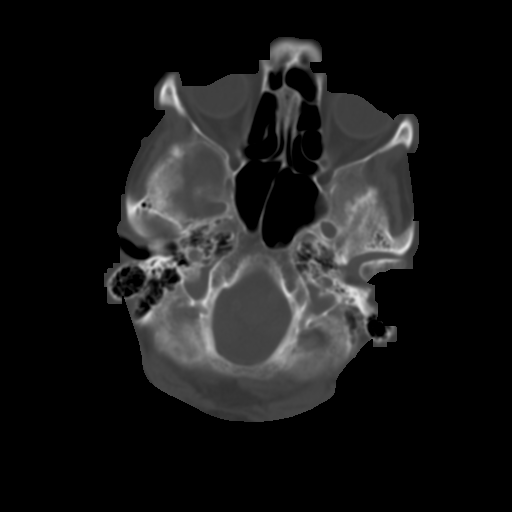
[im 6/29  brain]
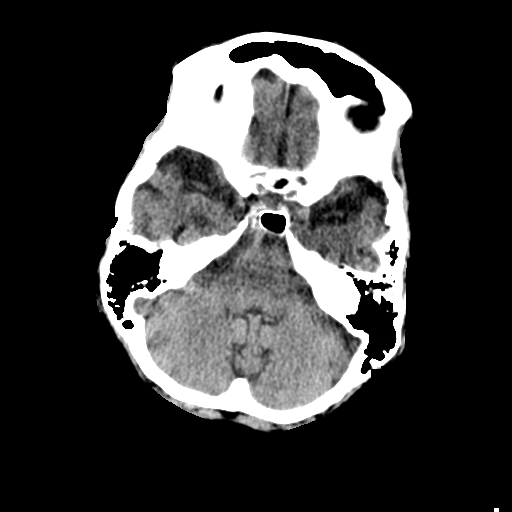
[im 9/29  brain]
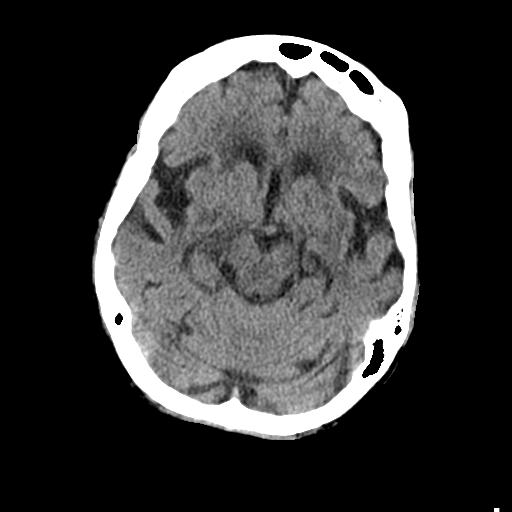
[im 12/29  brain]
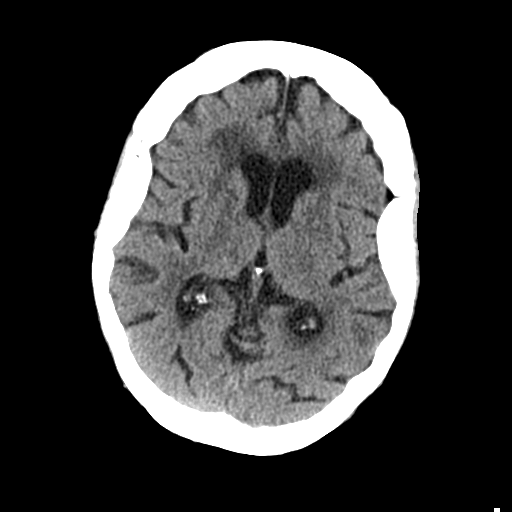
[im 15/29  brain]
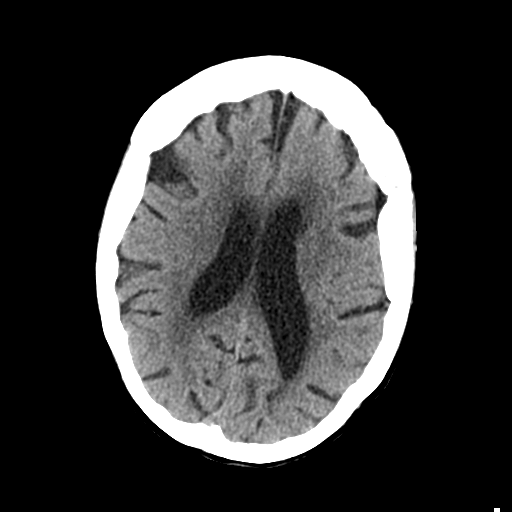
[im 15/29  bone]
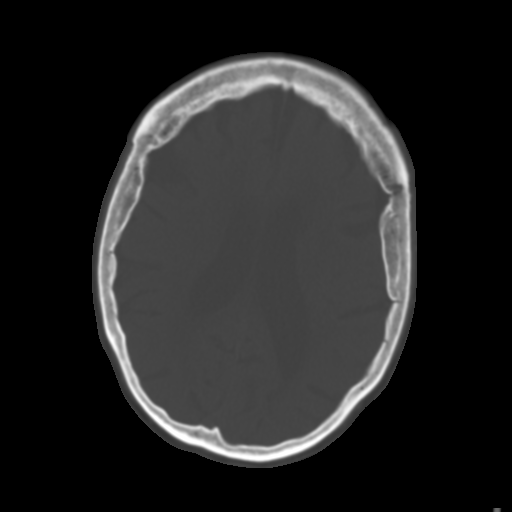
[im 18/29  brain]
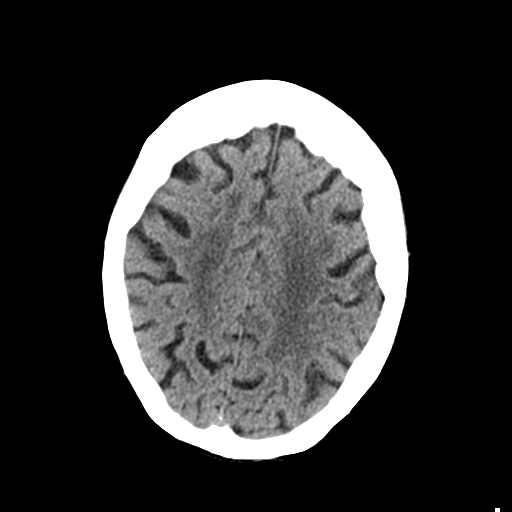
[im 21/29  brain]
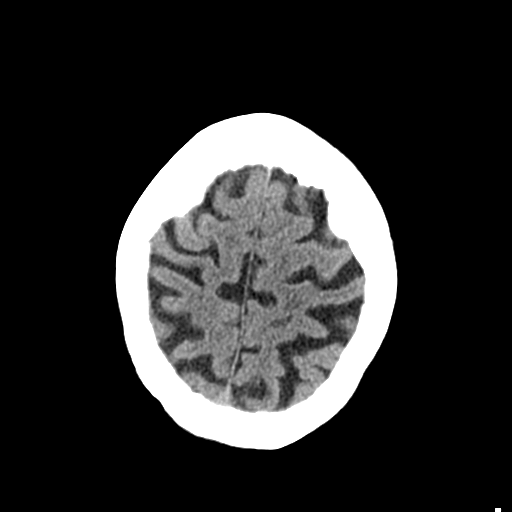
[im 24/29  brain]
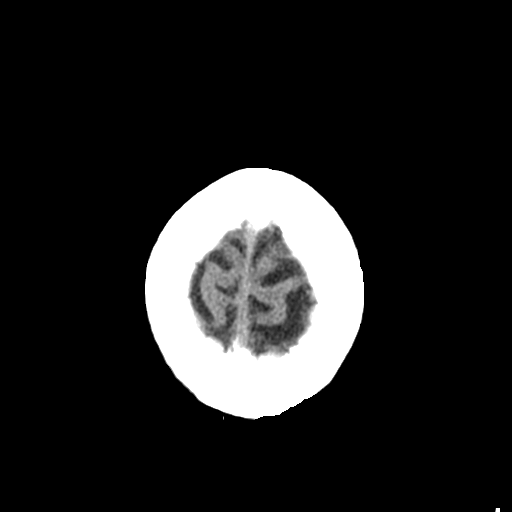
[im 27/29  brain]
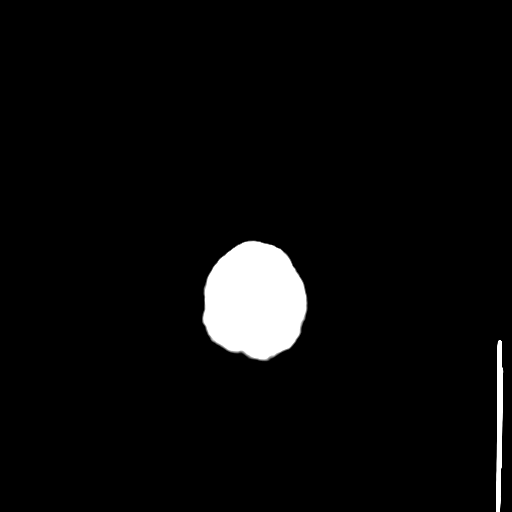
[im 27/29  bone]
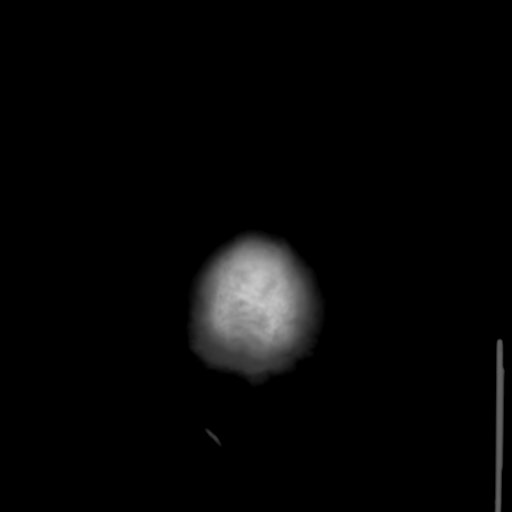

[Series 4: coronal soft tissue · coronal · 0.29mm/px · 3 of 62 slices shown]
[im 21/62  brain]
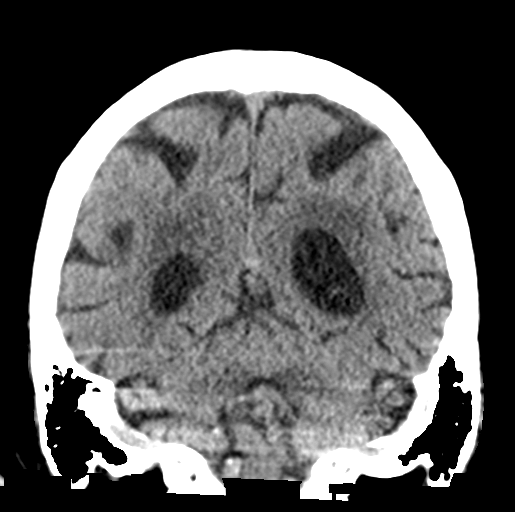
[im 28/62  brain]
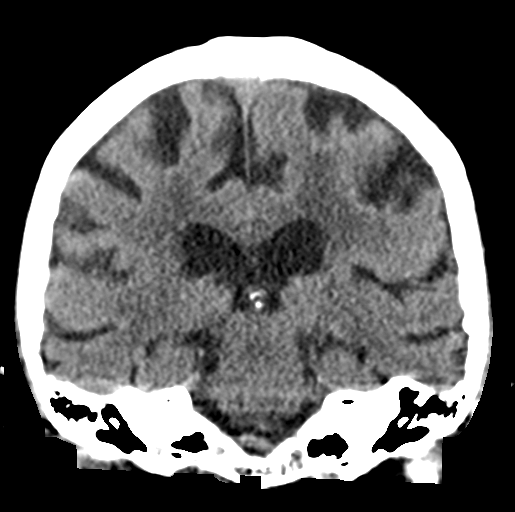
[im 34/62  brain]
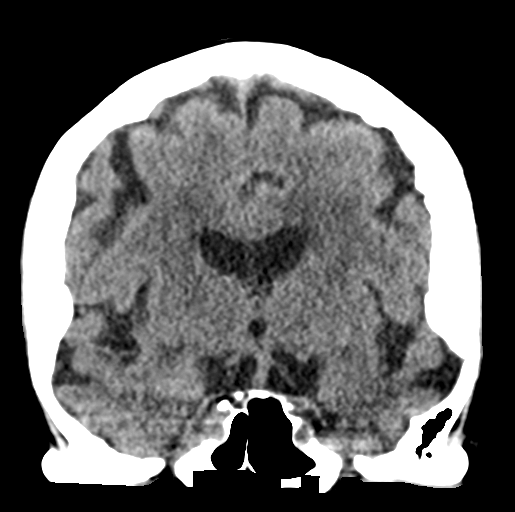

[Series 5: sagittal soft tissue · sagittal · 0.29mm/px · 3 of 49 slices shown]
[im 17/49  brain]
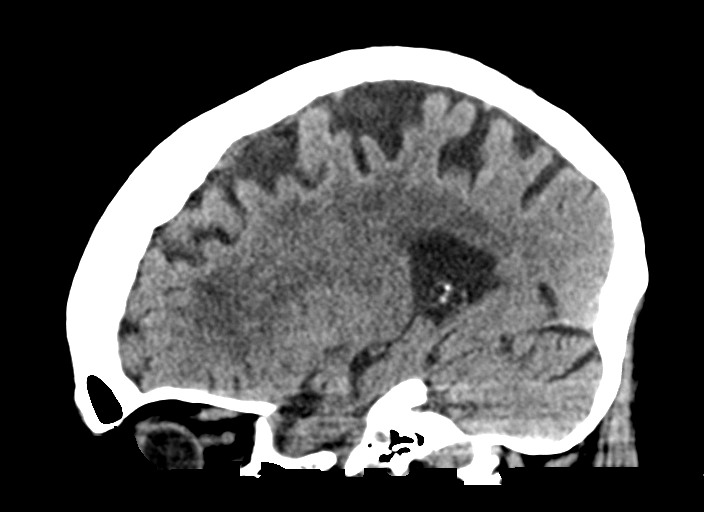
[im 25/49  brain]
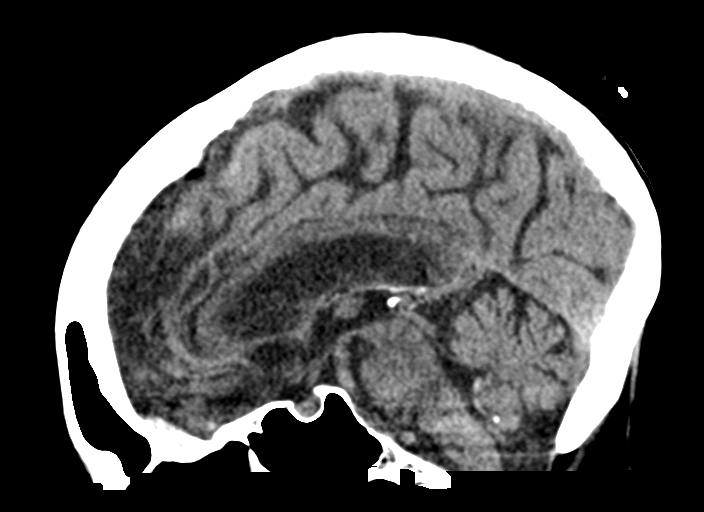
[im 33/49  brain]
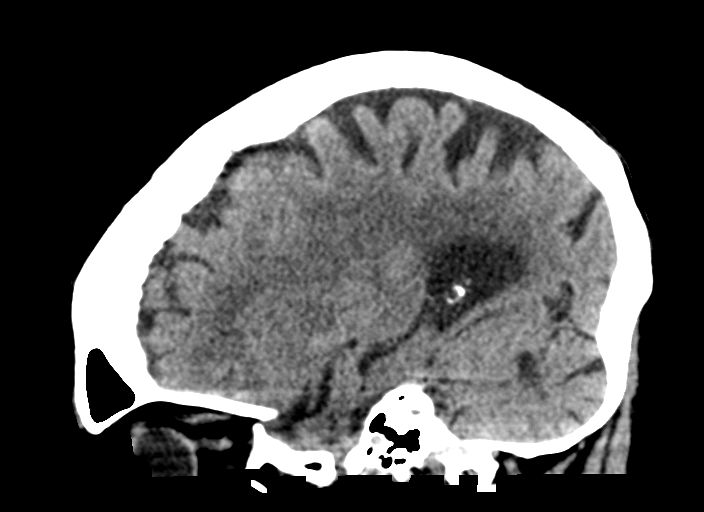

[15 of 46 positions shown; findings below may reference images not displayed]

FINDINGS: Brain: There is no mass, hemorrhage or extra-axial collection. There
is generalized atrophy without lobar predilection. Hypodensity of
the white matter is most commonly associated with chronic
microvascular disease.

Vascular: No abnormal hyperdensity of the major intracranial
arteries or dural venous sinuses. No intracranial atherosclerosis.

Skull: The visualized skull base, calvarium and extracranial soft
tissues are normal.

Sinuses/Orbits: No fluid levels or advanced mucosal thickening of
the visualized paranasal sinuses. No mastoid or middle ear effusion.
The orbits are normal.
IMPRESSION: Generalized atrophy and chronic microvascular ischemia without acute
intracranial abnormality.

## 2020-09-10 IMAGING — DX DG CHEST 1V PORT
1 series · 1 of 1 positions shown · non-contrast
Comparison: [DATE]

CLINICAL DATA: Weakness

EXAM:
PORTABLE CHEST 1 VIEW

[chest ap]
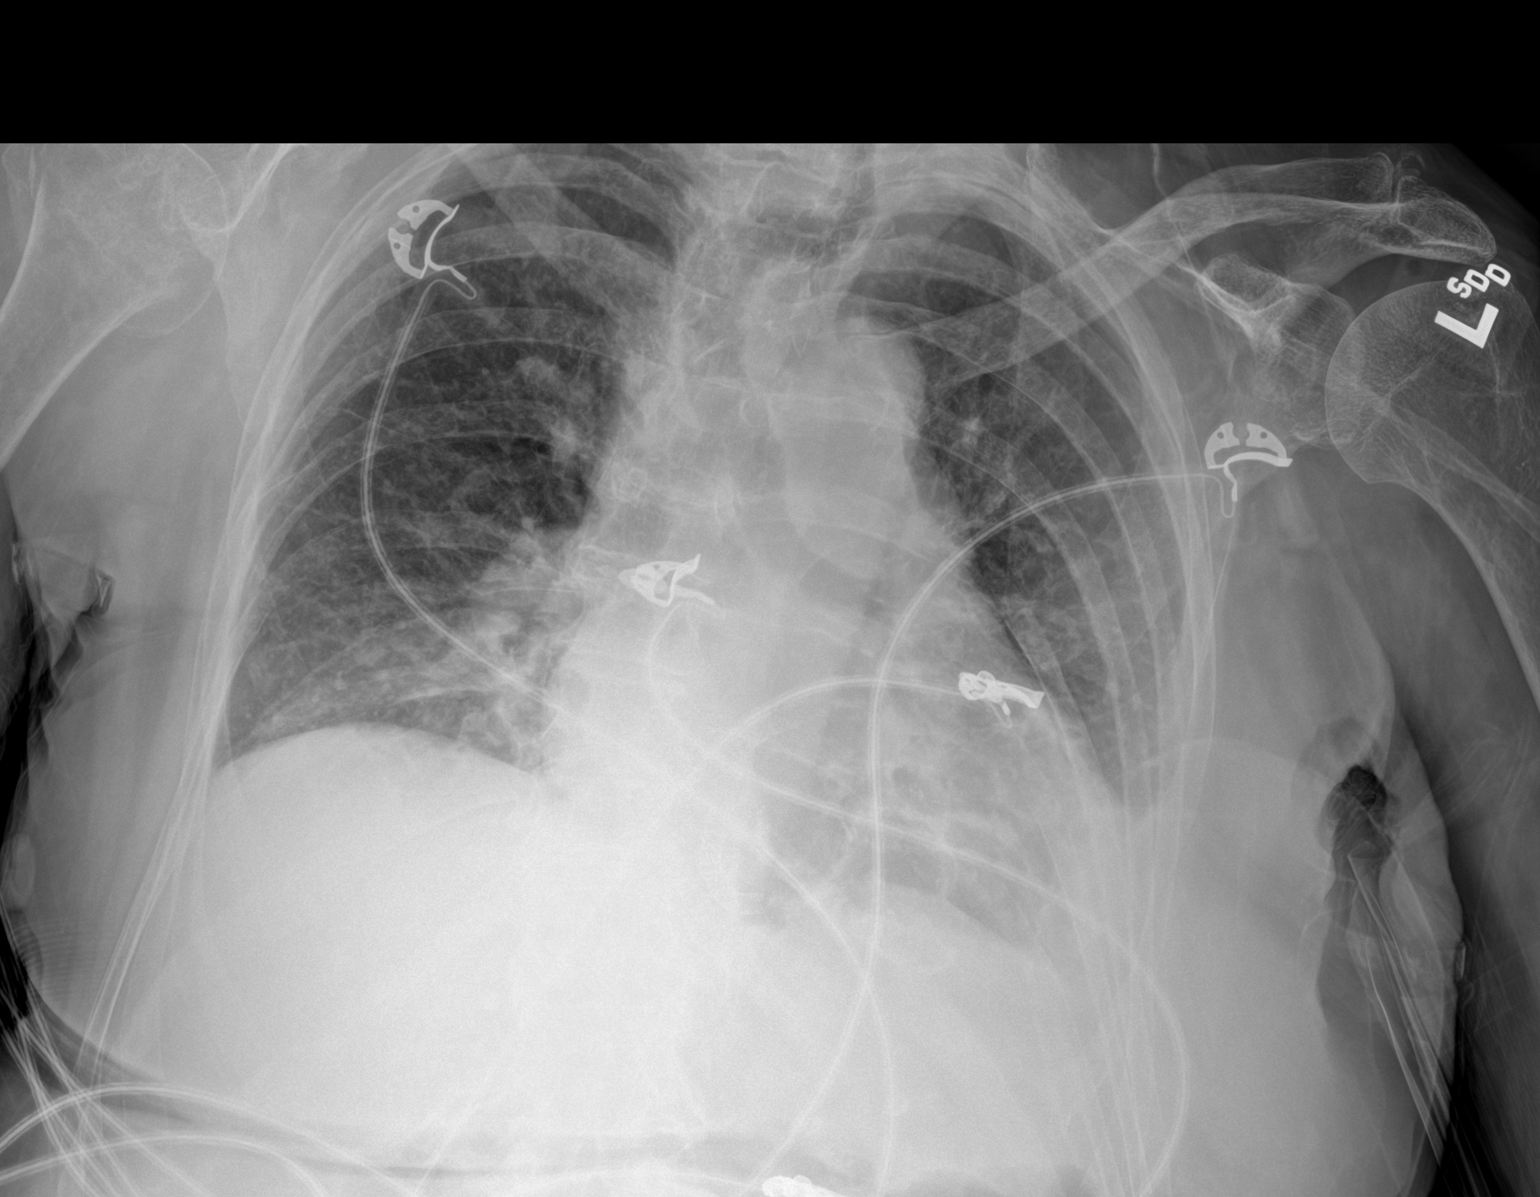

[1 of 1 positions shown; findings below may reference images not displayed]

FINDINGS: Bibasilar opacities, likely atelectasis. Heart is borderline in
size. No effusions or acute bony abnormality.
IMPRESSION: Bibasilar opacities, favor atelectasis.

## 2020-09-10 MED ORDER — SODIUM CHLORIDE 0.9% FLUSH: 3.0000 mL | Freq: Once | INTRAVENOUS | Status: DC

## 2020-09-10 NOTE — ED Provider Notes (Signed)
[facility] Emergency Department Provider Note   ____________________________________________   Event Date/Time   First MD Initiated Contact with Patient 09/10/20 2305     (approximate)  I have reviewed the triage vital signs and the nursing notes.   HISTORY  Chief Complaint Hyperglycemia and Fall  Level of V caveat: Limited by altered mentation  HPI Elizabeth [name] is a 87 y.o. female brought to the ED via EMS from home with a chief complaint of hyperglycemia, recurrent falls and right arm weakness noted in the past 48 hours.  Patient confused and unable to answer questions.  Room air saturations 88% on arrival, patient placed on 2 L nasal cannula oxygen with improvement to 95%.  Seen in the ED 09/08/2020 for fall with minor head injury.  According to that note, patient does not take anticoagulants     Past Medical History:  Diagnosis Date  . CAD (coronary artery disease)    a.  Inferolateral STEMI 09/02/2019  . Dementia (HCC)   . History of CVA (cerebrovascular accident)   . Hyperlipidemia LDL goal <70     Patient Active Problem List   Diagnosis Date Noted  . CAP (community acquired pneumonia) 09/11/2020  . Coronary artery disease involving native coronary artery of native heart without angina pectoris 05/06/2020  . Hyperlipidemia LDL goal <70 05/06/2020  . Hypokalemia 05/06/2020  . Protein-calorie malnutrition, severe 09/08/2019  . STEMI involving right coronary artery (HCC) 09/03/2019  . STEMI (ST elevation myocardial infarction) (HCC) 09/02/2019    Past Surgical History:  Procedure Laterality Date  . CORONARY/GRAFT ACUTE MI REVASCULARIZATION N/A 09/02/2019   Procedure: Coronary/Graft Acute MI Revascularization;  Surgeon: Yvonne Kendall, MD;  Location: [facility] INVASIVE CV LAB;  Service: Cardiovascular;  Laterality: N/A;  . LEFT HEART CATH AND CORONARY ANGIOGRAPHY N/A 09/02/2019   Procedure: LEFT HEART CATH AND CORONARY ANGIOGRAPHY;   Surgeon: Yvonne Kendall, MD;  Location: [facility] INVASIVE CV LAB;  Service: Cardiovascular;  Laterality: N/A;    Prior to Admission medications   Medication Sig Start Date End Date Taking? Authorizing Provider  aspirin 81 MG chewable tablet Chew 1 tablet (81 mg total) by mouth daily. 09/17/19   Tresa Moore, MD  clopidogrel (PLAVIX) 75 MG tablet Take 1 tablet PO QD 09/08/20   End, Cristal Deer, MD  Cyanocobalamin (B-12 PO) Take by mouth daily at 12 noon.    [provider]  nitroGLYCERIN (NITROSTAT) 0.4 MG SL tablet Place 1 tablet (0.4 mg total) under the tongue every 5 (five) minutes as needed for chest pain. 01/05/20   Dunn, Raymon Mutton, PA-C  rosuvastatin (CRESTOR) 20 MG tablet Take 1 tablet (20 mg total) by mouth daily. 09/08/20 12/07/20  End, Cristal Deer, MD    Allergies Patient has no known allergies.  No family history on file.  Social History Social History   Tobacco Use  . Smoking status: Never Smoker  . Smokeless tobacco: Never Used  Substance Use Topics  . Alcohol use: Never    Review of Systems  Constitutional: Positive for recurrent falls and generalized weakness.  No fever/chills Eyes: No visual changes. ENT: No sore throat. Cardiovascular: Denies chest pain. Respiratory: Denies shortness of breath. Gastrointestinal: No abdominal pain.  No nausea, no vomiting.  No diarrhea.  No constipation. Genitourinary: Negative for dysuria. Musculoskeletal: Negative for back pain. Skin: Negative for rash. Neurological: Negative for headaches, focal weakness or numbness. Endocrine: Positive for hyperglycemia.   ____________________________________________   PHYSICAL EXAM:  VITAL SIGNS: ED Triage Vitals  Enc Vitals Group     BP 09/10/20 2302 137/81     Pulse Rate 09/10/20 2302 88     Resp 09/10/20 2302 (!) 22     Temp 09/10/20 2302 97.6 F (36.4 C)     Temp Source 09/10/20 2302 Oral     SpO2 09/10/20 2257 (!) 88 %     Weight 09/10/20 2300 117 lb 12.8 oz (53.4  kg)     Height 09/10/20 2300 5\' 2"  (1.575 m)     Head Circumference --      Peak Flow --      Pain Score --      Pain Loc --      Pain Edu? --      Excl. in GC? --     Constitutional: Alert. Elderly appearing and in mild acute distress. Eyes: Conjunctivae are normal. PERRL. EOMI. Head: Atraumatic. Nose: No congestion/rhinnorhea. Mouth/Throat: Mucous membranes are mildly dry. Neck: No stridor.   Cardiovascular: Normal rate, regular rhythm. Grossly normal heart sounds.  Good peripheral circulation. Respiratory: Increased respiratory effort.  No retractions. Lungs with scattered rhonchi. Gastrointestinal: Soft and nontender to light or deep palpation. No distention. No abdominal bruits. No CVA tenderness. Musculoskeletal: No lower extremity tenderness nor edema.  No joint effusions. Neurologic: Alert and oriented to person.  Mumbling speech and language. 3/5 RUE & RLE motor strength.  Skin:  Skin is warm, dry and intact. No rash noted. Psychiatric: Mood and affect are normal. Speech and behavior are normal.  ____________________________________________   LABS (all labs ordered are listed, but only abnormal results are displayed)  Labs Reviewed  LACTIC ACID, PLASMA - Abnormal; Notable for the following components:      Result Value   Lactic Acid, Venous 3.4 (*)    All other components within normal limits  CBC WITH DIFFERENTIAL/PLATELET - Abnormal; Notable for the following components:   WBC 12.4 (*)    Neutro Abs 9.5 (*)    Monocytes Absolute 1.7 (*)    Abs Immature Granulocytes 0.11 (*)    All other components within normal limits  COMPREHENSIVE METABOLIC PANEL - Abnormal; Notable for the following components:   Potassium 3.4 (*)    Glucose, Bld 224 (*)    BUN 30 (*)    Calcium 8.7 (*)    Total Bilirubin 2.5 (*)    All other components within normal limits  BRAIN NATRIURETIC PEPTIDE - Abnormal; Notable for the following components:   B Natriuretic Peptide 747.3 (*)     All other components within normal limits  URINALYSIS, COMPLETE (UACMP) WITH MICROSCOPIC - Abnormal; Notable for the following components:   Color, Urine YELLOW (*)    APPearance HAZY (*)    Glucose, UA 150 (*)    Hgb urine dipstick MODERATE (*)    Ketones, ur 5 (*)    Protein, ur 100 (*)    All other components within normal limits  LACTIC ACID, PLASMA - Abnormal; Notable for the following components:   Lactic Acid, Venous 2.4 (*)    All other components within normal limits  TROPONIN I (HIGH SENSITIVITY) - Abnormal; Notable for the following components:   Troponin I (High Sensitivity) 224 (*)    All other components within normal limits  TROPONIN I (HIGH SENSITIVITY) - Abnormal; Notable for the following components:   Troponin I (High Sensitivity) 159 (*)    All other components within normal limits  RESP PANEL BY RT-PCR (FLU A&B, COVID) ARPGX2  CULTURE, BLOOD (ROUTINE X  2)  CULTURE, BLOOD (ROUTINE X 2)  URINE CULTURE  EXPECTORATED SPUTUM ASSESSMENT W GRAM STAIN, RFLX TO RESP C  PROTIME-INR  APTT  HEMOGLOBIN A1C  LIPID PANEL  PROTIME-INR  CORTISOL-AM, BLOOD  PROCALCITONIN  BASIC METABOLIC PANEL  CBC   ____________________________________________  EKG  ED ECG REPORT I, Aireana Ryland J, the attending physician, personally viewed and interpreted this ECG.   Date: 09/10/2020  EKG Time: 2301  Rate: 82  Rhythm: normal EKG, normal sinus rhythm  Axis: Normal  Intervals:none  ST&T Change: Nonspecific  ____________________________________________  RADIOLOGY I, Katlin Ciszewski J, personally viewed and evaluated these images (plain radiographs) as part of my medical decision making, as well as reviewing the written report by the radiologist.  ED MD interpretation:  Bibasilar opacities; no ICH  Official radiology report(s): CT HEAD WO CONTRAST  Result Date: 09/10/2020 CLINICAL DATA:  Fall and hyperglycemia EXAM: CT HEAD WITHOUT CONTRAST TECHNIQUE: Contiguous axial images were  obtained from the base of the skull through the vertex without intravenous contrast. COMPARISON:  09/08/2020 FINDINGS: Brain: There is no mass, hemorrhage or extra-axial collection. There is generalized atrophy without lobar predilection. Hypodensity of the white matter is most commonly associated with chronic microvascular disease. Vascular: No abnormal hyperdensity of the major intracranial arteries or dural venous sinuses. No intracranial atherosclerosis. Skull: The visualized skull base, calvarium and extracranial soft tissues are normal. Sinuses/Orbits: No fluid levels or advanced mucosal thickening of the visualized paranasal sinuses. No mastoid or middle ear effusion. The orbits are normal. IMPRESSION: Generalized atrophy and chronic microvascular ischemia without acute intracranial abnormality. Electronically Signed   By: Deatra Robinson M.D.   On: 09/10/2020 23:45   DG Chest Port 1 View  Result Date: 09/10/2020 CLINICAL DATA:  Weakness EXAM: PORTABLE CHEST 1 VIEW COMPARISON:  06/26/2017 FINDINGS: Bibasilar opacities, likely atelectasis. Heart is borderline in size. No effusions or acute bony abnormality. IMPRESSION: Bibasilar opacities, favor atelectasis. Electronically Signed   By: Charlett Nose M.D.   On: 09/10/2020 23:27   DG Humerus Right  Result Date: 09/11/2020 CLINICAL DATA:  Bruising/deformity of right upper extremity. Status post fall. Right arm weakness. EXAM: RIGHT HUMERUS - 2+ VIEW COMPARISON:  None. FINDINGS: Displaced fracture of the greater tuberosity and humeral neck. Alignment of the glenohumeral joint not well visualized. Associated overlying subcutaneus soft tissue edema. IMPRESSION: 1. Poorly visualized displaced fracture of the greater tuberosity and humeral neck. 2. Alignment of the glenohumeral joint not well visualized. Electronically Signed   By: Tish Frederickson M.D.   On: 09/11/2020 02:46    ____________________________________________   PROCEDURES  Procedure(s)  performed (including Critical Care):  .1-3 Lead EKG Interpretation Performed by: Irean Hong, MD Authorized by: Irean Hong, MD     Interpretation: normal     ECG rate:  88   ECG rate assessment: normal     Rhythm: sinus rhythm     Ectopy: none     Conduction: normal   Comments:     Patient placed on cardiac monitor to evaluate for arrthymias    CRITICAL CARE Performed by: Irean Hong   Total critical care time: 45 minutes  Critical care time was exclusive of separately billable procedures and treating other patients.  Critical care was necessary to treat or prevent imminent or life-threatening deterioration.  Critical care was time spent personally by me on the following activities: development of treatment plan with patient and/or surrogate as well as nursing, discussions with consultants, evaluation of patient's response to treatment, examination of  patient, obtaining history from patient or surrogate, ordering and performing treatments and interventions, ordering and review of laboratory studies, ordering and review of radiographic studies, pulse oximetry and re-evaluation of patient's condition.  ____________________________________________   INITIAL IMPRESSION / ASSESSMENT AND PLAN / ED COURSE  As part of my medical decision making, I reviewed the following data within the electronic MEDICAL RECORD NUMBER Nursing notes reviewed and incorporated, Labs reviewed, EKG interpreted, Old chart reviewed, Radiograph reviewed, Discussed with admitting physician and Notes from prior ED visits     87 year old female who presents with altered mentation, recurrent falls, generalized weakness, right-sided weakness and hypoxia. Differential diagnosis includes, but is not limited to, alcohol, illicit or prescription medications, or other toxic ingestion; intracranial pathology such as stroke or intracerebral hemorrhage; fever or infectious causes including sepsis; hypoxemia and/or hypercarbia;  uremia; trauma; endocrine related disorders such as diabetes, hypoglycemia, and thyroid-related diseases; hypertensive encephalopathy; etc.  Will obtain lab work, CT head, chest x-ray.  Patient took 3 person assist to position her for x-ray because she could not due to weakness.  Anticipate hospitalization.  Clinical Course as of 09/11/20 0444  Sat Sep 11, 2020  0011 Noted elevated lactic acid; code sepsis initiated.  Elevated troponin most likely demand ischemia, will cover with aspirin and Lovenox.  Will discuss with hospitalist services for admission. [JS]    Clinical Course User Index [JS] Irean Hong, MD     ____________________________________________   FINAL CLINICAL IMPRESSION(S) / ED DIAGNOSES  Final diagnoses:  Fall, initial encounter  Altered mental status, unspecified altered mental status type  Weakness  Cerebrovascular accident (CVA), unspecified mechanism (HCC)  Hypoxia  Community acquired pneumonia, unspecified laterality  Sepsis, due to unspecified organism, unspecified whether acute organ dysfunction present (HCC)  Elevated troponin  Closed displaced fracture of surgical neck of right humerus, unspecified fracture morphology, initial encounter     ED Discharge Orders    None       Note:  This document was prepared using Dragon voice recognition software and may include unintentional dictation errors.   Irean Hong, MD 09/11/20 [phone]

## 2020-09-10 NOTE — ED Triage Notes (Signed)
Patient arrives via [facility] from home for hyper glycemia, falls (past few weeks), and right arm weakness (past 48 hours). Patient able to answer some questions at this time.

## 2020-09-11 ENCOUNTER — Inpatient Hospital Stay: Payer: Medicare HMO

## 2020-09-11 DIAGNOSIS — Z515 Encounter for palliative care: Secondary | ICD-10-CM | POA: Diagnosis not present

## 2020-09-11 DIAGNOSIS — Z66 Do not resuscitate: Secondary | ICD-10-CM | POA: Diagnosis present

## 2020-09-11 DIAGNOSIS — I252 Old myocardial infarction: Secondary | ICD-10-CM | POA: Diagnosis not present

## 2020-09-11 DIAGNOSIS — R296 Repeated falls: Secondary | ICD-10-CM

## 2020-09-11 DIAGNOSIS — W19XXXA Unspecified fall, initial encounter: Secondary | ICD-10-CM | POA: Diagnosis present

## 2020-09-11 DIAGNOSIS — I639 Cerebral infarction, unspecified: Secondary | ICD-10-CM | POA: Diagnosis not present

## 2020-09-11 DIAGNOSIS — Z7982 Long term (current) use of aspirin: Secondary | ICD-10-CM | POA: Diagnosis not present

## 2020-09-11 DIAGNOSIS — Z951 Presence of aortocoronary bypass graft: Secondary | ICD-10-CM | POA: Diagnosis not present

## 2020-09-11 DIAGNOSIS — J9601 Acute respiratory failure with hypoxia: Secondary | ICD-10-CM

## 2020-09-11 DIAGNOSIS — Z8673 Personal history of transient ischemic attack (TIA), and cerebral infarction without residual deficits: Secondary | ICD-10-CM | POA: Diagnosis not present

## 2020-09-11 DIAGNOSIS — Z79899 Other long term (current) drug therapy: Secondary | ICD-10-CM | POA: Diagnosis not present

## 2020-09-11 DIAGNOSIS — J189 Pneumonia, unspecified organism: Secondary | ICD-10-CM | POA: Diagnosis not present

## 2020-09-11 DIAGNOSIS — I2510 Atherosclerotic heart disease of native coronary artery without angina pectoris: Secondary | ICD-10-CM | POA: Diagnosis present

## 2020-09-11 DIAGNOSIS — Z20822 Contact with and (suspected) exposure to covid-19: Secondary | ICD-10-CM | POA: Diagnosis present

## 2020-09-11 DIAGNOSIS — R4182 Altered mental status, unspecified: Secondary | ICD-10-CM | POA: Diagnosis not present

## 2020-09-11 DIAGNOSIS — E872 Acidosis: Secondary | ICD-10-CM | POA: Diagnosis present

## 2020-09-11 DIAGNOSIS — Z7902 Long term (current) use of antithrombotics/antiplatelets: Secondary | ICD-10-CM | POA: Diagnosis not present

## 2020-09-11 DIAGNOSIS — S40021A Contusion of right upper arm, initial encounter: Secondary | ICD-10-CM

## 2020-09-11 DIAGNOSIS — S42211A Unspecified displaced fracture of surgical neck of right humerus, initial encounter for closed fracture: Secondary | ICD-10-CM | POA: Diagnosis present

## 2020-09-11 DIAGNOSIS — R0902 Hypoxemia: Secondary | ICD-10-CM | POA: Diagnosis not present

## 2020-09-11 DIAGNOSIS — R531 Weakness: Secondary | ICD-10-CM

## 2020-09-11 DIAGNOSIS — E876 Hypokalemia: Secondary | ICD-10-CM | POA: Diagnosis present

## 2020-09-11 DIAGNOSIS — R778 Other specified abnormalities of plasma proteins: Secondary | ICD-10-CM | POA: Diagnosis present

## 2020-09-11 DIAGNOSIS — G309 Alzheimer's disease, unspecified: Secondary | ICD-10-CM | POA: Diagnosis present

## 2020-09-11 DIAGNOSIS — E785 Hyperlipidemia, unspecified: Secondary | ICD-10-CM | POA: Diagnosis present

## 2020-09-11 DIAGNOSIS — A419 Sepsis, unspecified organism: Secondary | ICD-10-CM | POA: Diagnosis not present

## 2020-09-11 DIAGNOSIS — I6389 Other cerebral infarction: Secondary | ICD-10-CM | POA: Diagnosis not present

## 2020-09-11 DIAGNOSIS — R6520 Severe sepsis without septic shock: Secondary | ICD-10-CM

## 2020-09-11 DIAGNOSIS — G8191 Hemiplegia, unspecified affecting right dominant side: Secondary | ICD-10-CM | POA: Diagnosis present

## 2020-09-11 DIAGNOSIS — F0280 Dementia in other diseases classified elsewhere without behavioral disturbance: Secondary | ICD-10-CM | POA: Diagnosis present

## 2020-09-11 LAB — BLOOD CULTURE ID PANEL (REFLEXED) - BCID2

## 2020-09-11 LAB — URINALYSIS, COMPLETE (UACMP) WITH MICROSCOPIC
Bacteria, UA: NONE SEEN
Bilirubin Urine: NEGATIVE
Glucose, UA: 150 mg/dL — AB
Ketones, ur: 5 mg/dL — AB
Leukocytes,Ua: NEGATIVE
Nitrite: NEGATIVE
Protein, ur: 100 mg/dL — AB
Specific Gravity, Urine: 1.029 (ref 1.005–1.030)
pH: 5 (ref 5.0–8.0)

## 2020-09-11 LAB — BASIC METABOLIC PANEL
Anion gap: 4 — ABNORMAL LOW (ref 5–15)
Anion gap: 8 (ref 5–15)
BUN: 20 mg/dL (ref 8–23)
BUN: 19 mg/dL (ref 8–23)
CO2: 22 mmol/L (ref 22–32)
CO2: 22 mmol/L (ref 22–32)
Calcium: 6 mg/dL — CL (ref 8.9–10.3)
Calcium: 7.5 mg/dL — ABNORMAL LOW (ref 8.9–10.3)
Chloride: 116 mmol/L — ABNORMAL HIGH (ref 98–111)
Chloride: 110 mmol/L (ref 98–111)
Creatinine, Ser: 0.46 mg/dL (ref 0.44–1.00)
Creatinine, Ser: 0.43 mg/dL — ABNORMAL LOW (ref 0.44–1.00)
GFR, Estimated: >60 mL/min (ref >60)
GFR, Estimated: >60 mL/min (ref >60)
Glucose, Bld: 118 mg/dL — ABNORMAL HIGH (ref 70–99)
Glucose, Bld: 134 mg/dL — ABNORMAL HIGH (ref 70–99)
Potassium: 2.8 mmol/L — ABNORMAL LOW (ref 3.5–5.1)
Potassium: 3.3 mmol/L — ABNORMAL LOW (ref 3.5–5.1)
Sodium: 142 mmol/L (ref 135–145)
Sodium: 140 mmol/L (ref 135–145)

## 2020-09-11 LAB — COMPREHENSIVE METABOLIC PANEL
ALT: 25 U/L (ref 0–44)
AST: 37 U/L (ref 15–41)
Albumin: 3.6 g/dL (ref 3.5–5.0)
Alkaline Phosphatase: 62 U/L (ref 38–126)
Anion gap: 12 (ref 5–15)
BUN: 30 mg/dL — ABNORMAL HIGH (ref 8–23)
CO2: 24 mmol/L (ref 22–32)
Calcium: 8.7 mg/dL — ABNORMAL LOW (ref 8.9–10.3)
Chloride: 100 mmol/L (ref 98–111)
Creatinine, Ser: 0.61 mg/dL (ref 0.44–1.00)
GFR, Estimated: >60 mL/min (ref >60)
Glucose, Bld: 224 mg/dL — ABNORMAL HIGH (ref 70–99)
Potassium: 3.4 mmol/L — ABNORMAL LOW (ref 3.5–5.1)
Sodium: 136 mmol/L (ref 135–145)
Total Bilirubin: 2.5 mg/dL — ABNORMAL HIGH (ref 0.3–1.2)
Total Protein: 7 g/dL (ref 6.5–8.1)

## 2020-09-11 LAB — LIPID PANEL
Cholesterol: 85 mg/dL (ref 0–200)
HDL: 42 mg/dL (ref >40)
LDL Cholesterol: 32 mg/dL (ref 0–99)
Total CHOL/HDL Ratio: 2 RATIO
Triglycerides: 54 mg/dL (ref <150)
VLDL: 11 mg/dL (ref 0–40)

## 2020-09-11 LAB — PROTIME-INR
INR: 1.1 (ref 0.8–1.2)
INR: 1.3 — ABNORMAL HIGH (ref 0.8–1.2)
Prothrombin Time: 14.2 seconds (ref 11.4–15.2)
Prothrombin Time: 16.2 seconds — ABNORMAL HIGH (ref 11.4–15.2)

## 2020-09-11 LAB — CBC
HCT: 32.1 % — ABNORMAL LOW (ref 36.0–46.0)
Hemoglobin: 11 g/dL — ABNORMAL LOW (ref 12.0–15.0)
MCH: 31.2 pg (ref 26.0–34.0)
MCHC: 34.3 g/dL (ref 30.0–36.0)
MCV: 90.9 fL (ref 80.0–100.0)
Platelets: 166 10*3/uL (ref 150–400)
RBC: 3.53 MIL/uL — ABNORMAL LOW (ref 3.87–5.11)
RDW: 15.2 % (ref 11.5–15.5)
WBC: 9.9 10*3/uL (ref 4.0–10.5)
nRBC: 0.2 % (ref 0.0–0.2)

## 2020-09-11 LAB — LACTIC ACID, PLASMA
Lactic Acid, Venous: 3.4 mmol/L (ref 0.5–1.9)
Lactic Acid, Venous: 2.4 mmol/L (ref 0.5–1.9)
Lactic Acid, Venous: 1.3 mmol/L (ref 0.5–1.9)
Lactic Acid, Venous: 2.1 mmol/L (ref 0.5–1.9)

## 2020-09-11 LAB — BRAIN NATRIURETIC PEPTIDE: B Natriuretic Peptide: 747.3 pg/mL — ABNORMAL HIGH (ref 0.0–100.0)

## 2020-09-11 LAB — TROPONIN I (HIGH SENSITIVITY)
Troponin I (High Sensitivity): 224 ng/L (ref <18)
Troponin I (High Sensitivity): 159 ng/L (ref <18)

## 2020-09-11 LAB — CORTISOL-AM, BLOOD: Cortisol - AM: 18.6 ug/dL (ref 6.7–22.6)

## 2020-09-11 LAB — MAGNESIUM: Magnesium: 1.5 mg/dL — ABNORMAL LOW (ref 1.7–2.4)

## 2020-09-11 LAB — APTT: aPTT: 30 seconds (ref 24–36)

## 2020-09-11 LAB — RESP PANEL BY RT-PCR (FLU A&B, COVID) ARPGX2
Influenza A by PCR: NEGATIVE
Influenza B by PCR: NEGATIVE
SARS Coronavirus 2 by RT PCR: NEGATIVE

## 2020-09-11 LAB — PROCALCITONIN: Procalcitonin: 0.33 ng/mL

## 2020-09-11 IMAGING — DX DG HUMERUS 2V *R*
2 series · 3 of 3 positions shown · non-contrast
Comparison: None.

CLINICAL DATA: Bruising/deformity of right upper extremity. Status
post fall. Right arm weakness.

EXAM:
RIGHT HUMERUS - 2+ VIEW

[humerus ap]
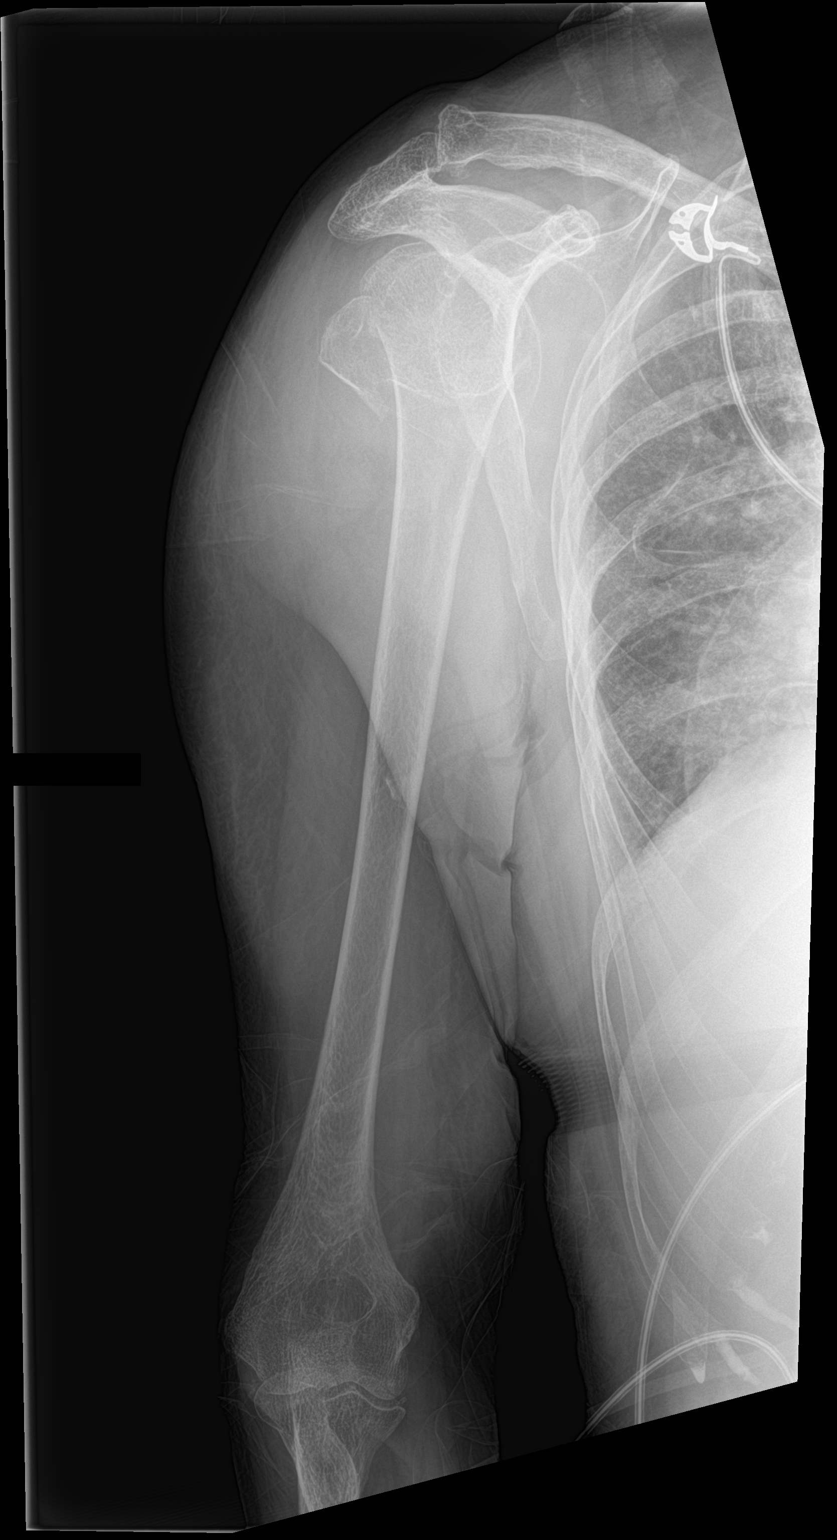

[Series 2: humerus lat · 0.14mm/px · 2 of 2 slices shown]
[im 1/2]
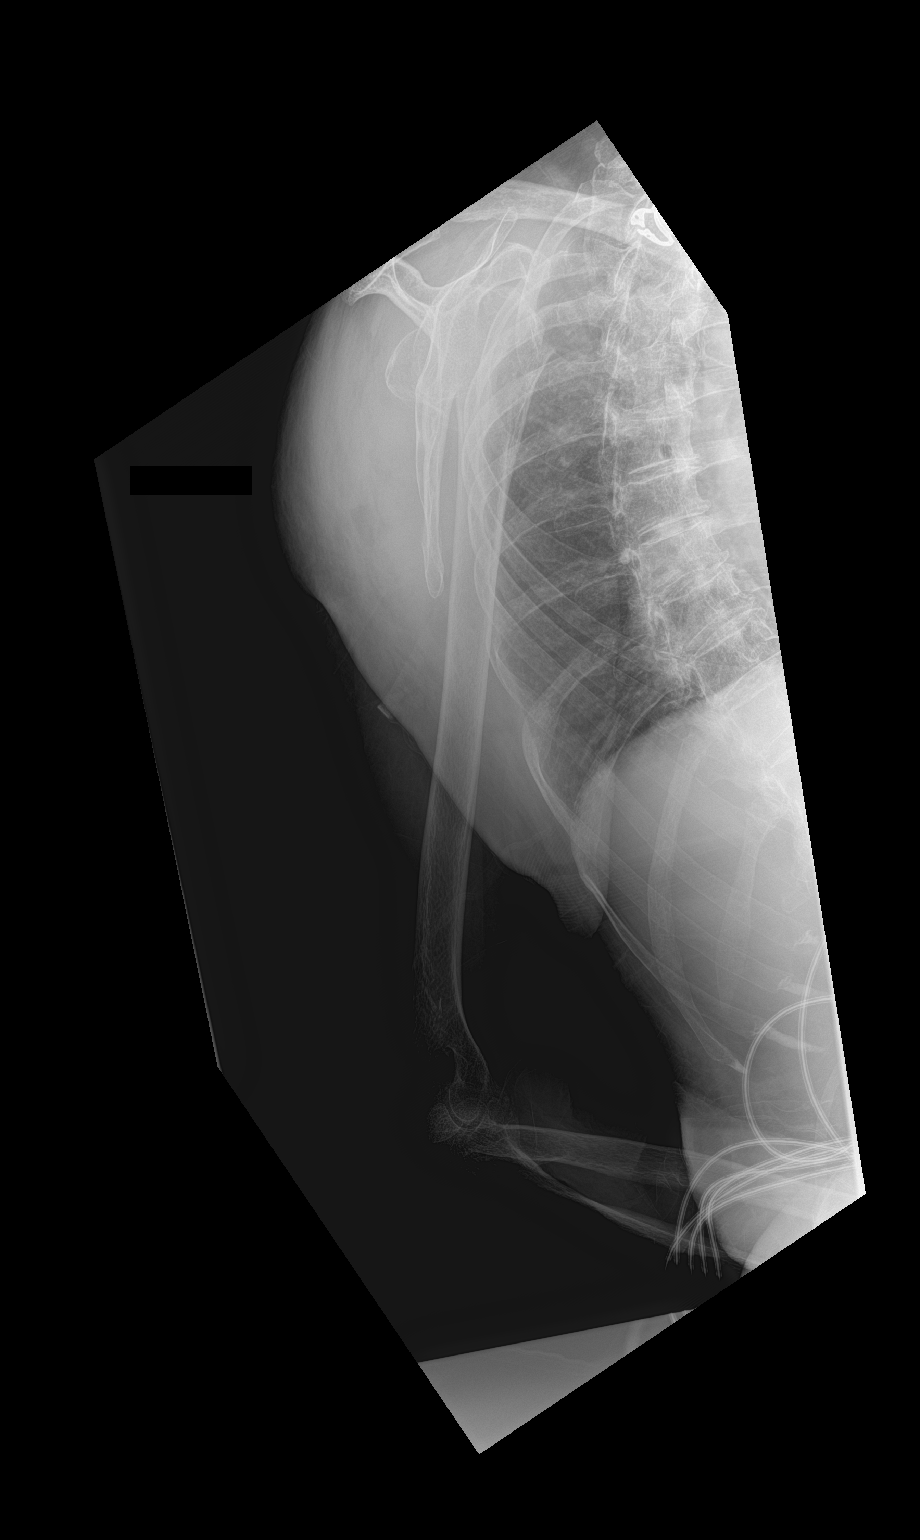
[im 2/2]
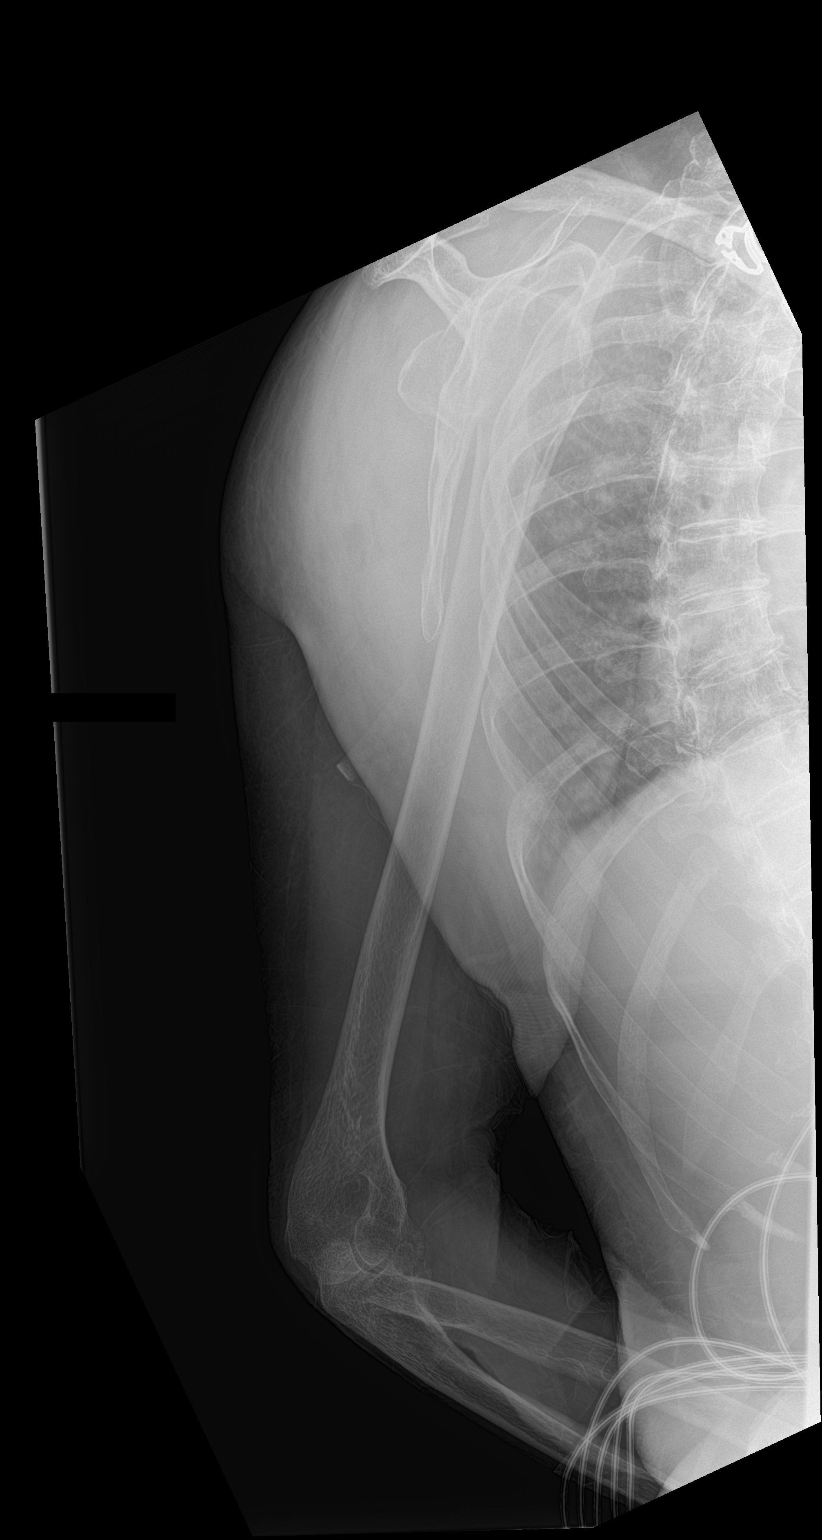

[3 of 3 positions shown; findings below may reference images not displayed]

FINDINGS: Displaced fracture of the greater tuberosity and humeral neck.
Alignment of the glenohumeral joint not well visualized. Associated
overlying subcutaneus soft tissue edema.
IMPRESSION: 1. Poorly visualized displaced fracture of the greater tuberosity
and humeral neck.
2. Alignment of the glenohumeral joint not well visualized.

## 2020-09-11 IMAGING — US US CAROTID DUPLEX BILAT
1 series · 13 of 24 positions shown · non-contrast
Comparison: Head CT [DATE].

CLINICAL DATA: [AGE] female with recent neurologic deficit.

EXAM:
BILATERAL CAROTID DUPLEX ULTRASOUND
TECHNIQUE: Gray scale imaging, color Doppler and duplex ultrasound were
performed of bilateral carotid and vertebral arteries in the neck.

[Series 1: us carotid bilateral · 13 of 65 slices shown]
[im 1/65]
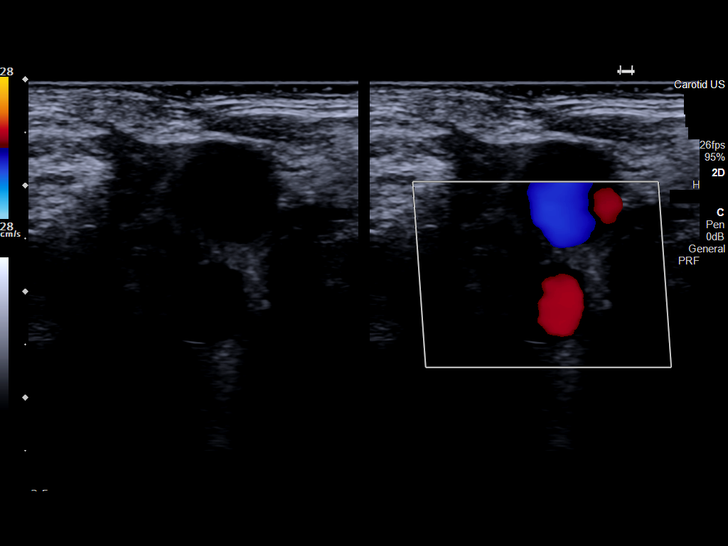
[im 6/65]
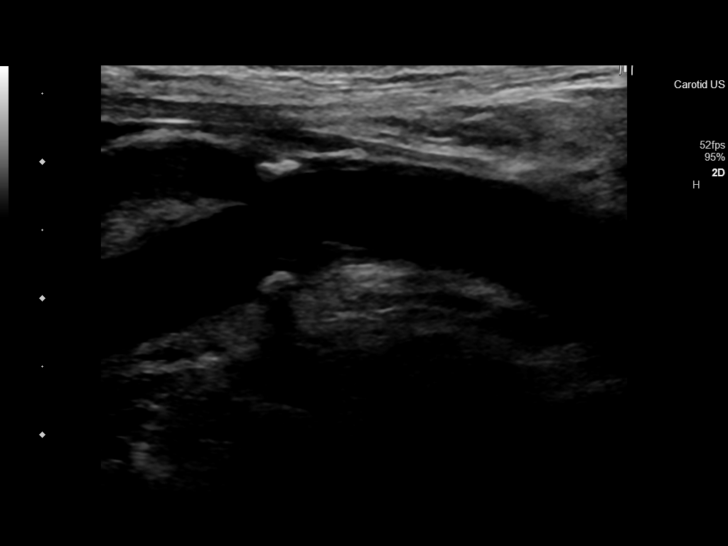
[im 12/65]
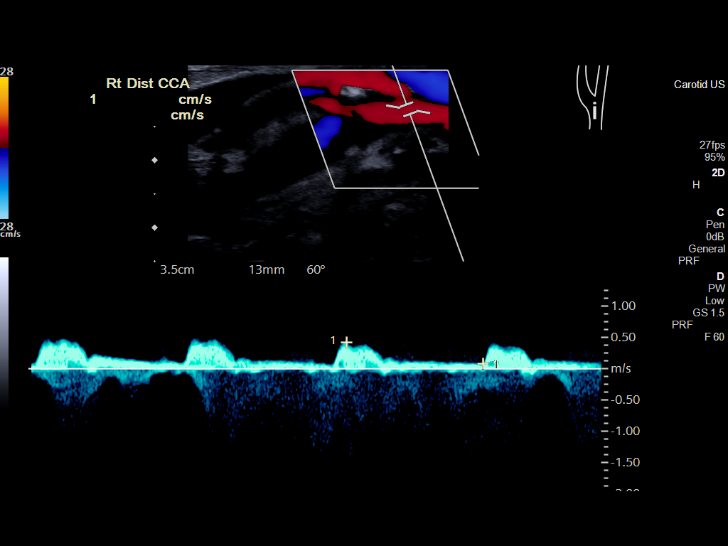
[im 17/65]
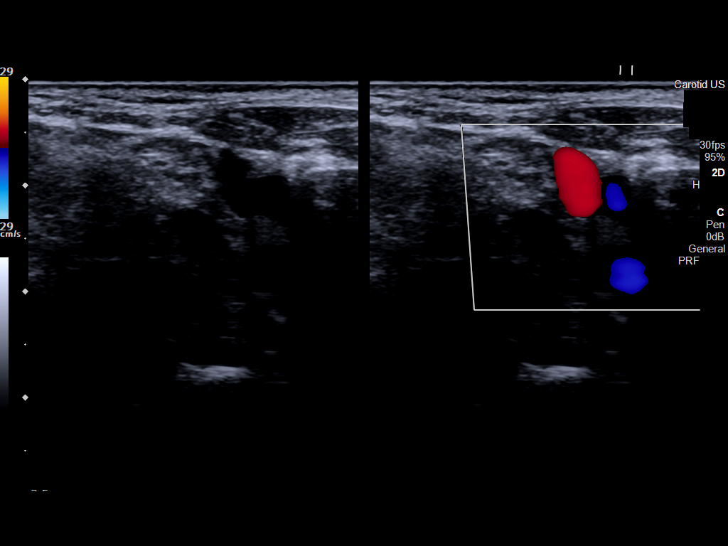
[im 23/65]
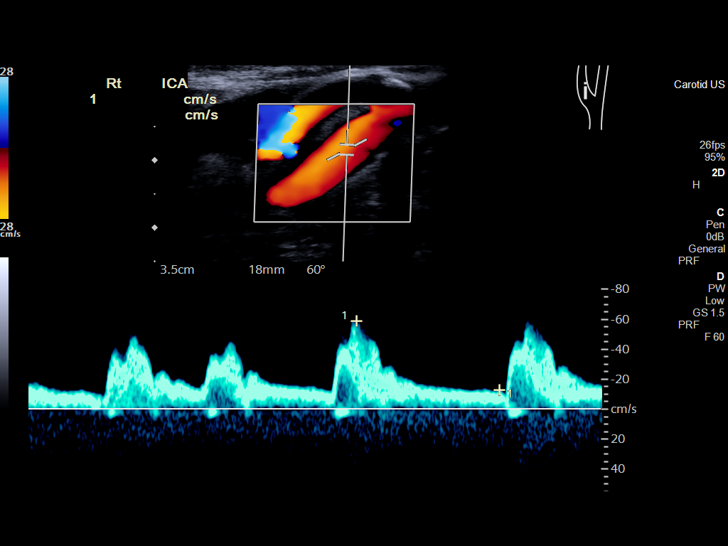
[im 28/65]
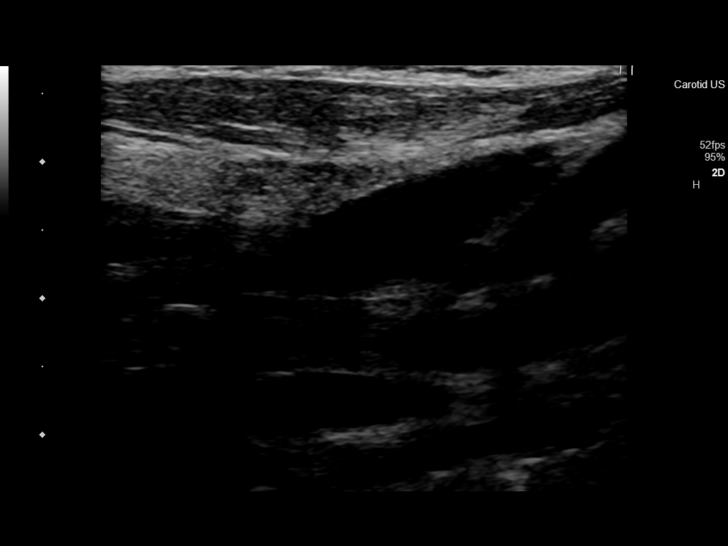
[im 34/65]
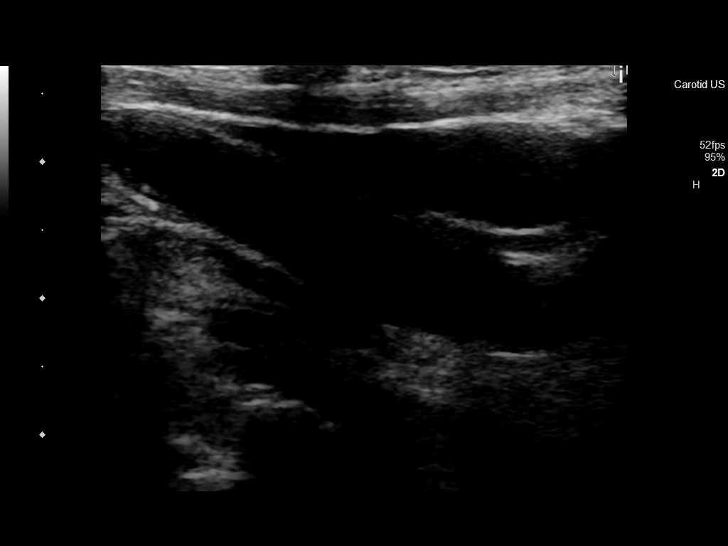
[im 37/65]
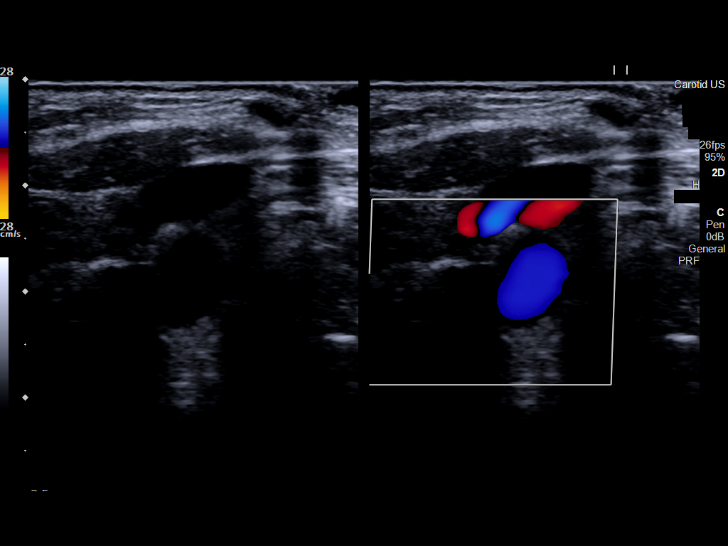
[im 42/65]
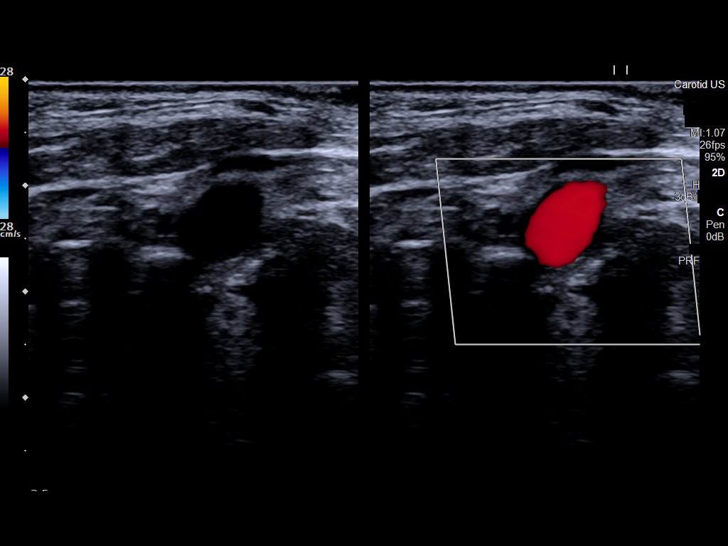
[im 48/65]
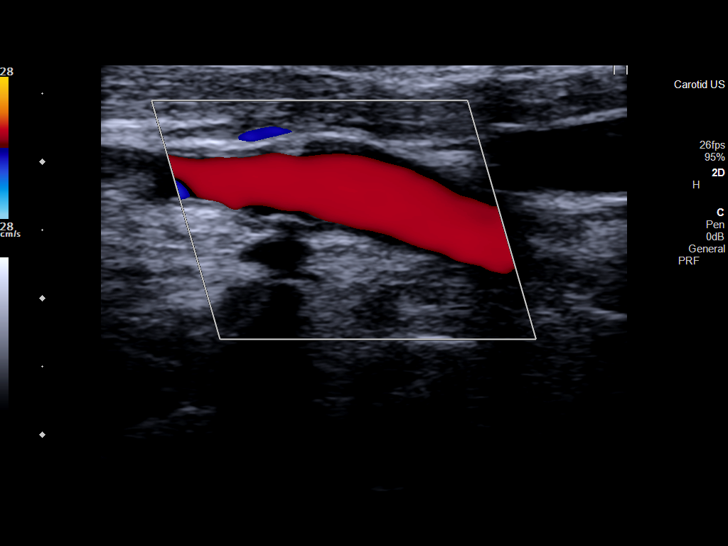
[im 53/65]
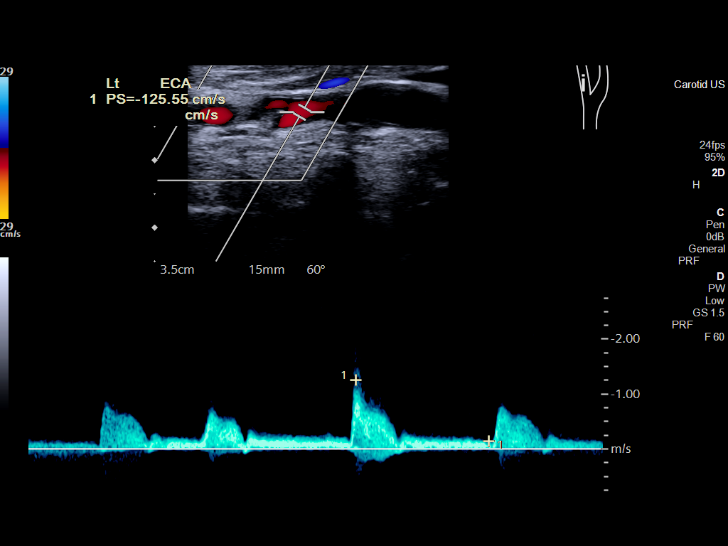
[im 59/65]
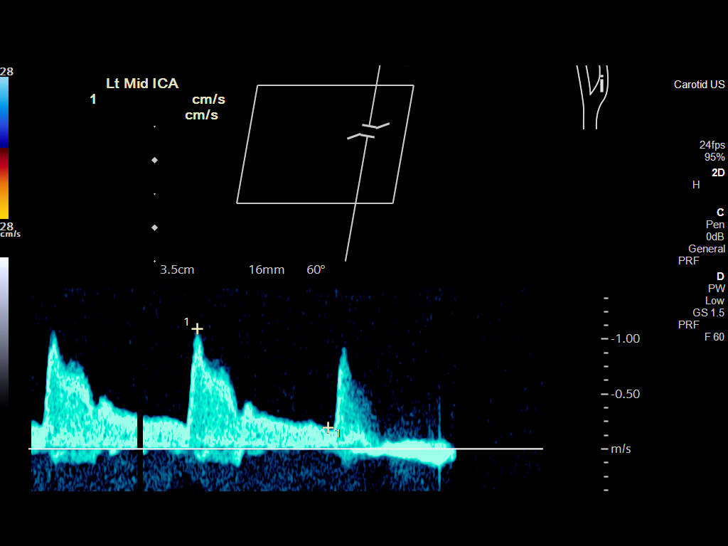
[im 65/65]
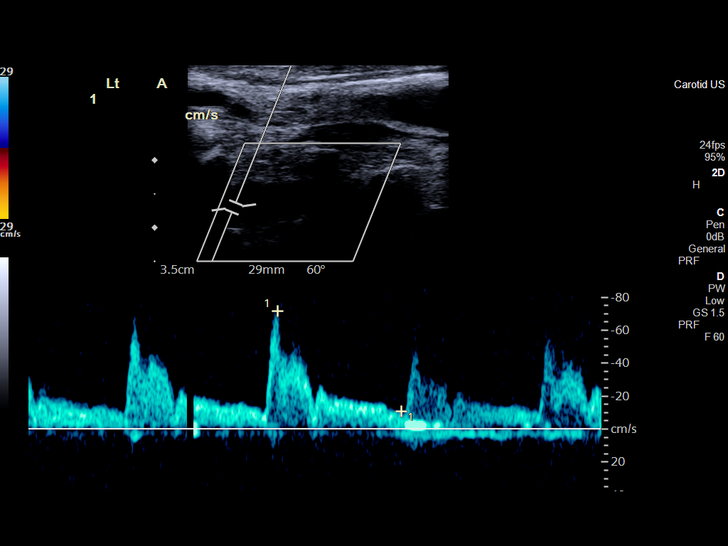

[13 of 24 positions shown; findings below may reference images not displayed]

FINDINGS: Criteria: Quantification of carotid stenosis is based on velocity
parameters that correlate the residual internal carotid diameter
with NASCET-based stenosis levels, using the diameter of the distal
internal carotid lumen as the denominator for stenosis measurement.

The following velocity measurements were obtained:

RIGHT

ICA: 87 cm/sec

CCA: 56 cm/sec

SYSTOLIC ICA/CCA RATIO:

ECA: 73 cm/sec

LEFT

ICA: 109 cm/sec

CCA: 72 cm/sec

SYSTOLIC ICA/CCA RATIO:

ECA: 126 cm/sec

RIGHT CAROTID ARTERY: Atherosclerosis at the right carotid
bifurcation and right ICA origin, proximal bulb.

RIGHT VERTEBRAL ARTERY:  Patent, antegrade flow.

LEFT CAROTID ARTERY: Atherosclerosis visible at the left carotid
bifurcation and proximal bulb.

LEFT VERTEBRAL ARTERY:  Patent, antegrade flow.
IMPRESSION: Bilateral carotid bifurcation atherosclerosis but Doppler criteria
indicate less than 50% stenosis.

## 2020-09-11 IMAGING — MR MR HEAD W/O CM
10 of 11 series · 37 of 48 positions shown · non-contrast
Comparison: Brain MRI [DATE].  Head CT [DATE].

CLINICAL DATA: [AGE] female with right extremity weakness.
Generalized weakness and recurrent falls.

EXAM:
MRI HEAD WITHOUT CONTRAST
TECHNIQUE: Multiplanar, multiecho pulse sequences of the brain and surrounding
structures were obtained without intravenous contrast.

[Series 5: ax dwi_tracew · axial · 3.0mm · 0.65mm/px · z∈[-48,+88]mm · 5 of 42 slices shown]
[im 1/42]
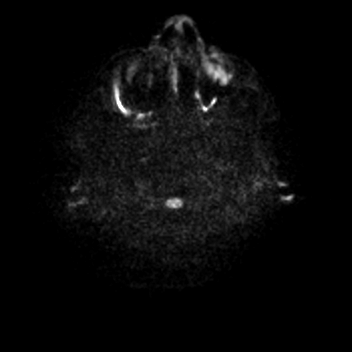
[im 11/42]
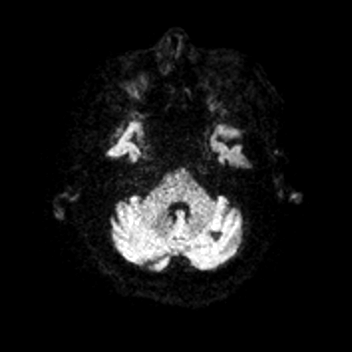
[im 21/42]
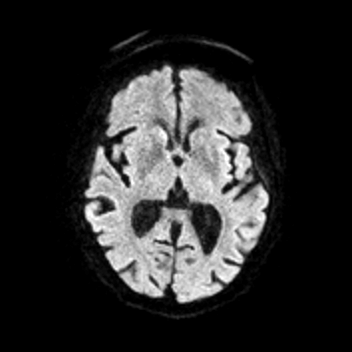
[im 31/42]
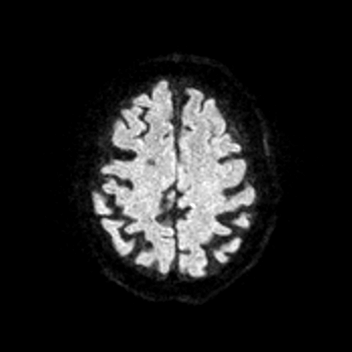
[im 42/42]
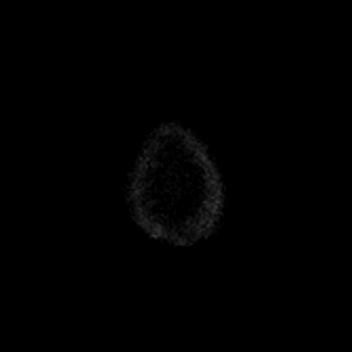

[Series 6: ax dwi_adc · axial · 3.0mm · 0.65mm/px · z∈[-48,+88]mm · 5 of 42 slices shown]
[im 1/42]
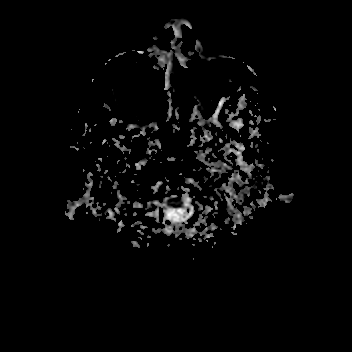
[im 11/42]
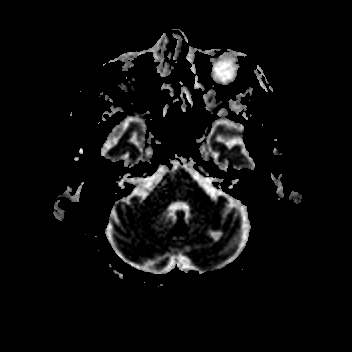
[im 21/42]
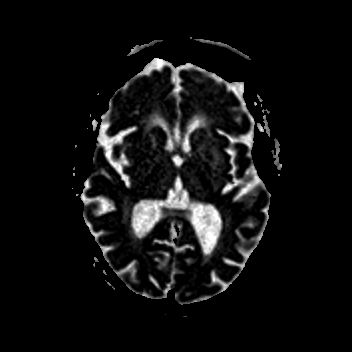
[im 31/42]
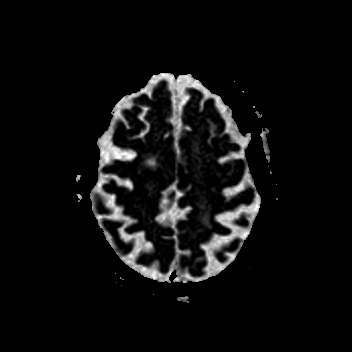
[im 42/42]
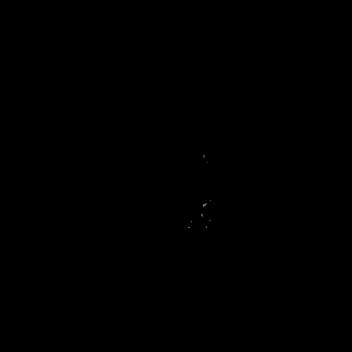

[Series 7: cor dwi_tracew · coronal · 5.0mm · 0.60mm/px · 3 of 36 slices shown]
[im 1/36]
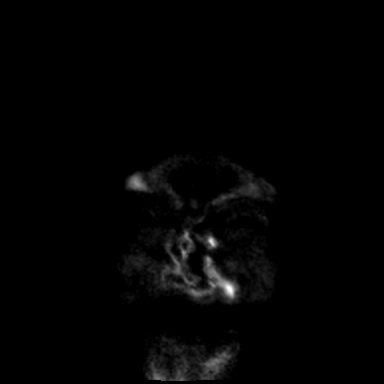
[im 18/36]
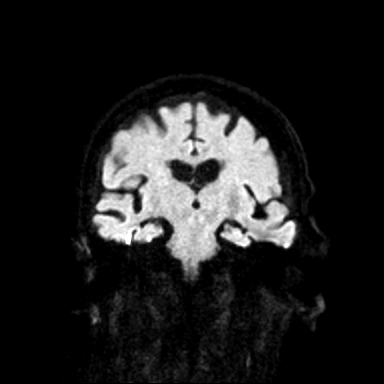
[im 36/36]
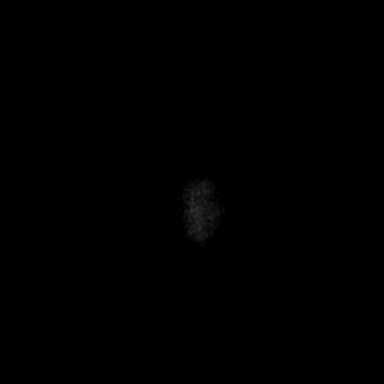

[Series 8: cor dwi_adc · coronal · 5.0mm · 0.60mm/px · 3 of 36 slices shown]
[im 1/36]
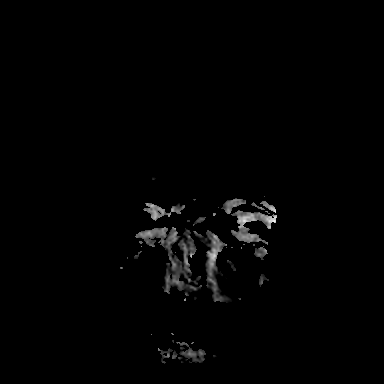
[im 18/36]
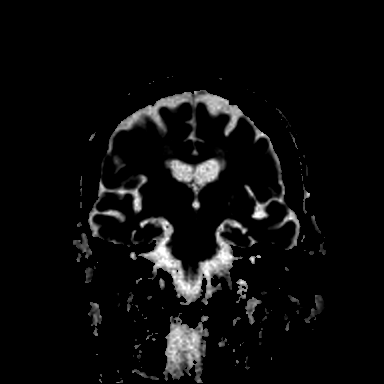
[im 36/36]
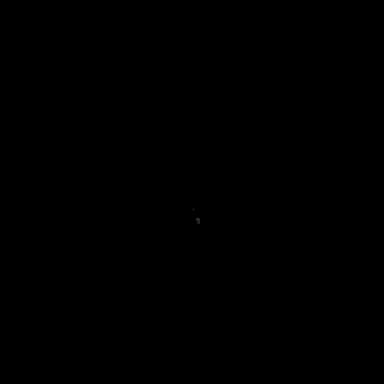

[Series 9: T1 · sagittal · 5.0mm · 0.62mm/px · 2 of 21 slices shown (1 of 2)]
[im 1/21]
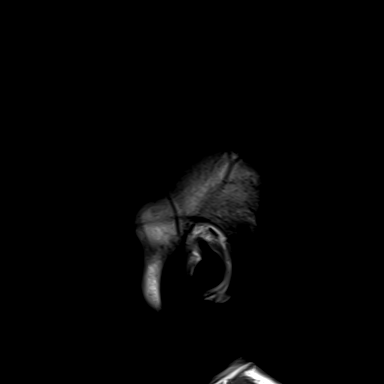
[im 21/21]
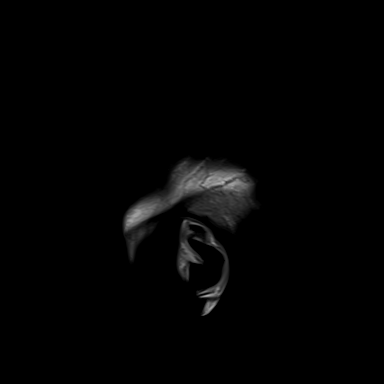

[Series 10: T2 · axial · 5.0mm · 0.45mm/px · z∈[-49,+89]mm · 2 of 24 slices shown (1 of 2)]
[im 1/24]
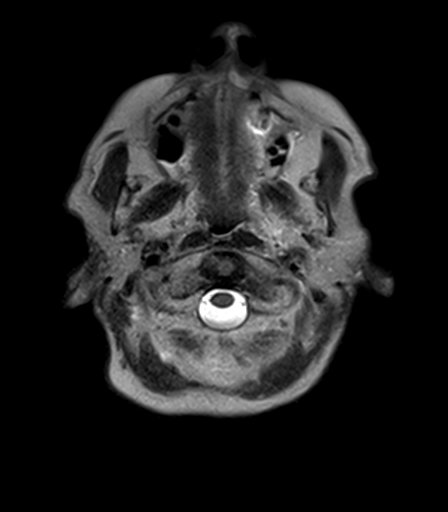
[im 24/24]
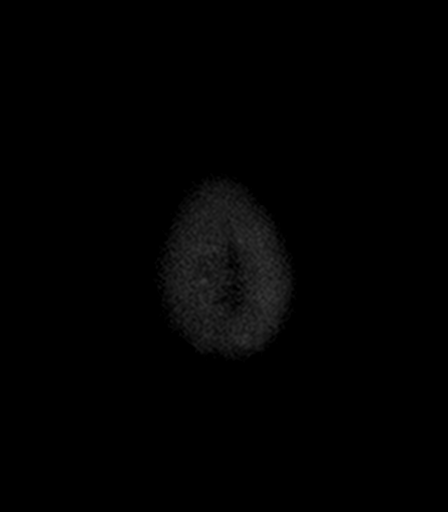

[Series 12: pha_images · axial · 3.0mm · 0.90mm/px · z∈[-50,-5]mm · 2 of 47 slices shown]
[im 1/47]
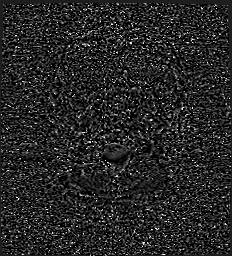
[im 16/47]
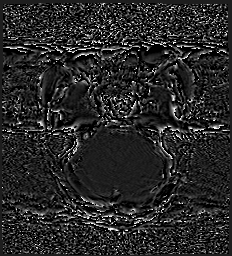

[Series 15: FLAIR · axial · 3.0mm · 0.53mm/px · z∈[-51,+90]mm · 4 of 48 slices shown]
[im 1/48]
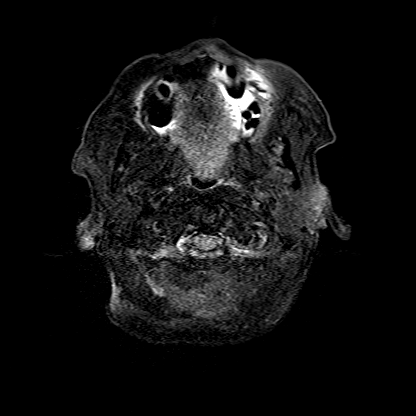
[im 16/48]
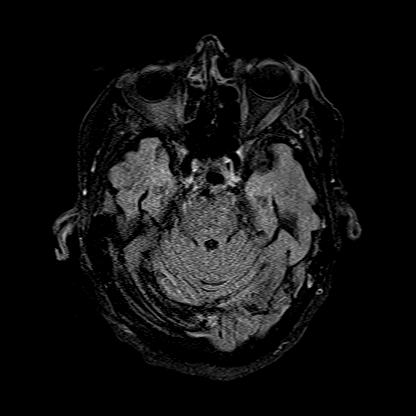
[im 32/48]
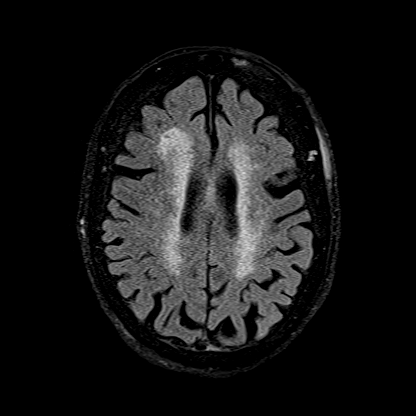
[im 48/48]
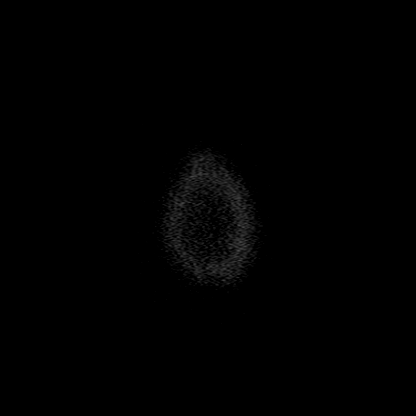

[Series 16: T1 · axial · 1.0mm · 0.98mm/px · z∈[-52,+91]mm · 8 of 144 slices shown (2 of 2)]
[im 1/144]
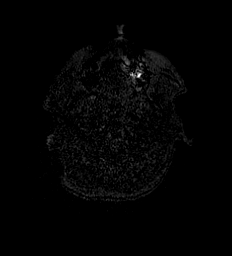
[im 24/144]
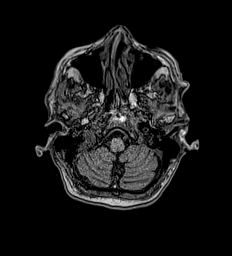
[im 48/144]
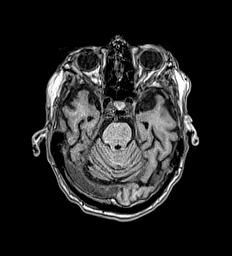
[im 60/144]
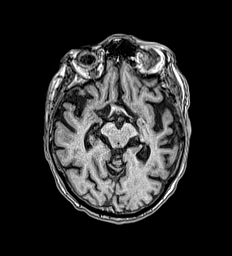
[im 84/144]
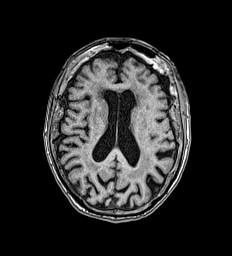
[im 96/144]
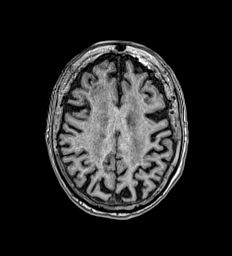
[im 120/144]
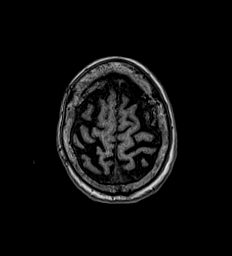
[im 144/144]
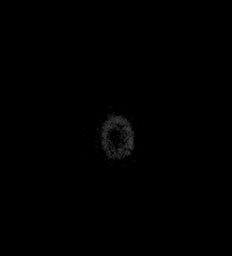

[Series 17: T2 · coronal · 5.0mm · 0.45mm/px · 3 of 29 slices shown (2 of 2)]
[im 1/29]
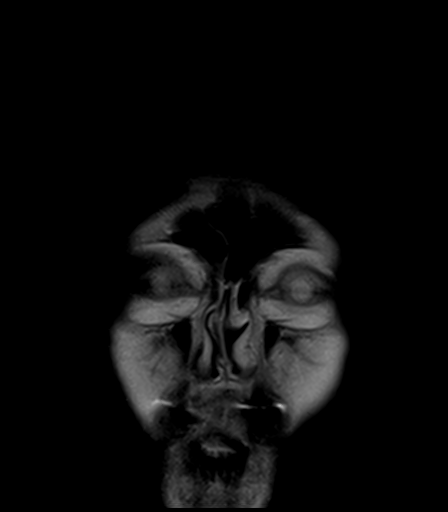
[im 15/29]
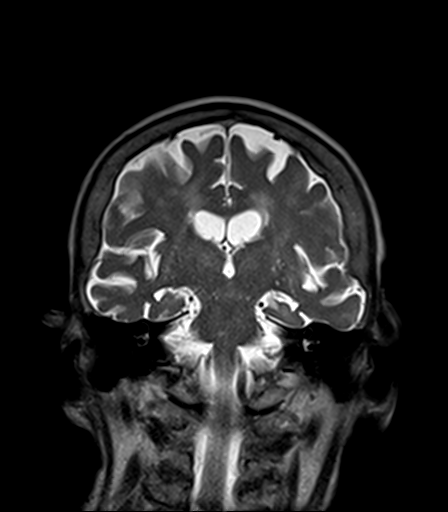
[im 29/29]
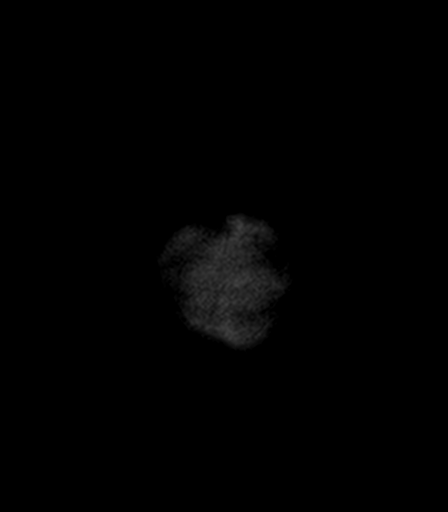

[37 of 48 positions shown; findings below may reference images not displayed]

FINDINGS: Brain: Small 4-5 mm focus of restricted diffusion in the left
central pons on series 5, image 12 (series 7, image 20 and series 8,
image 20). Faint T2 and FLAIR hyperintensity. No hemorrhage or mass
effect.

No other restricted diffusion. No midline shift, mass effect,
evidence of mass lesion, ventriculomegaly, extra-axial collection or
acute intracranial hemorrhage. Cervicomedullary junction and
pituitary are within normal limits.

Stable small chronic infarcts in the bilateral cerebellum. Patchy
and confluent T2 and FLAIR heterogeneity in the bilateral cerebral
white matter and deep gray matter nuclei is stable. Small focus of
cystic encephalomalacia in the right centrum semiovale is stable
compatible with chronic white matter lacune. No chronic cerebral
blood products or cortical encephalomalacia identified.

Vascular: Major intracranial vascular flow voids are stable since
last year.

Skull and upper cervical spine: Negative for age.

Sinuses/Orbits: Stable and negative.

Other: Mastoids remain clear. Visible internal auditory structures
appear normal. Negative visible scalp and face.
IMPRESSION: 1. Small acute infarct in the anterior left paracentral pons,
basilar perforator artery territory. No associated hemorrhage or
mass effect.
2. Otherwise stable MRI appearance of chronic small vessel disease
since last year.

## 2020-09-11 IMAGING — MR MR MRA HEAD W/O CM
2 series · 19 of 48 positions shown · non-contrast
Comparison: MRI same day.

CLINICAL DATA: Right extremity weakness with recurrent falls. Acute
infarction left pons

EXAM:
MRA HEAD WITHOUT CONTRAST
TECHNIQUE: Angiographic images of the Circle of Willis were acquired using MRA
technique without intravenous contrast.

[Series 5: TOF · axial · 0.5mm · 0.41mm/px · z∈[-89,+4]mm · 10 of 205 slices shown (1 of 2)]
[im 9/205]
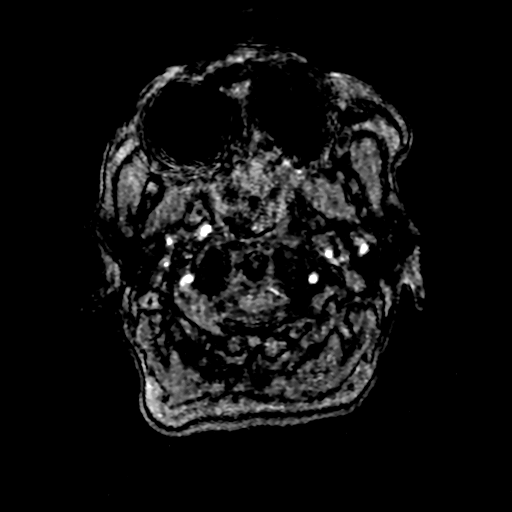
[im 36/205]
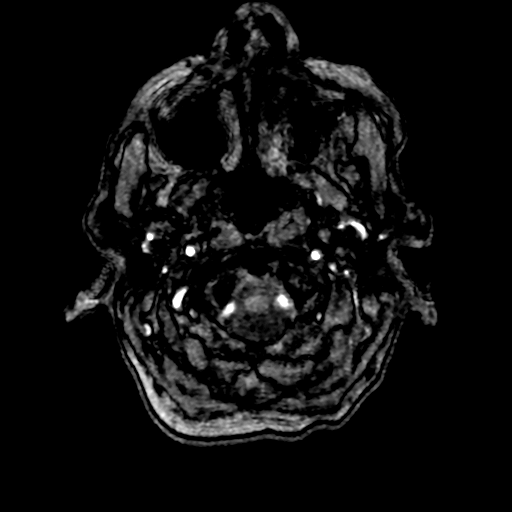
[im 63/205]
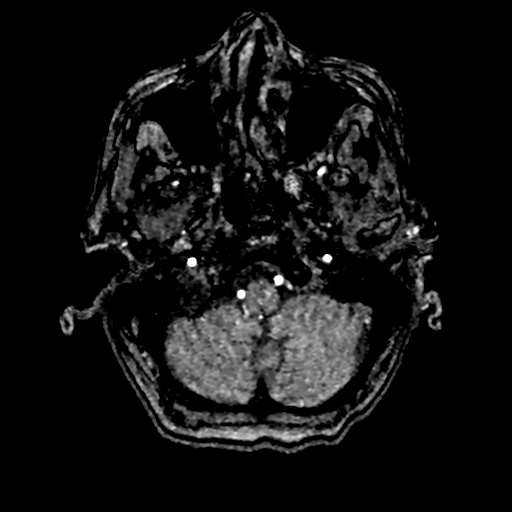
[im 89/205]
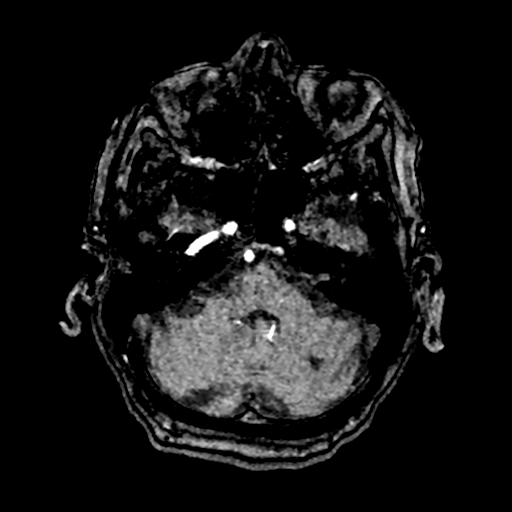
[im 107/205]
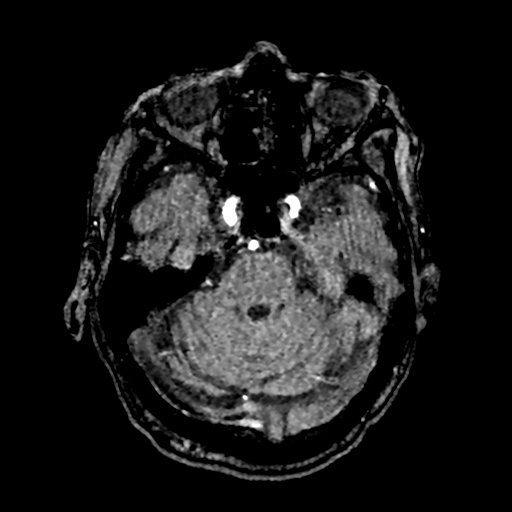
[im 116/205]
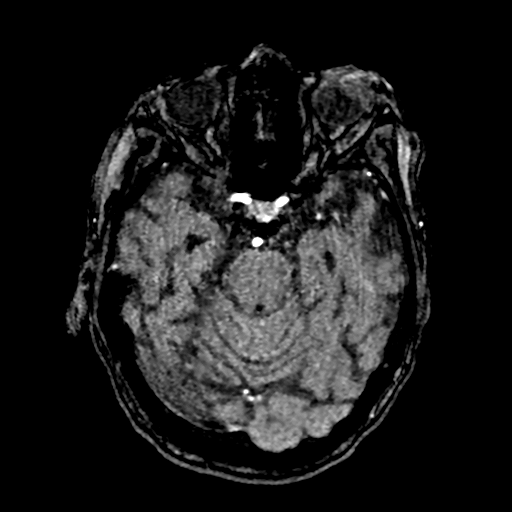
[im 142/205]
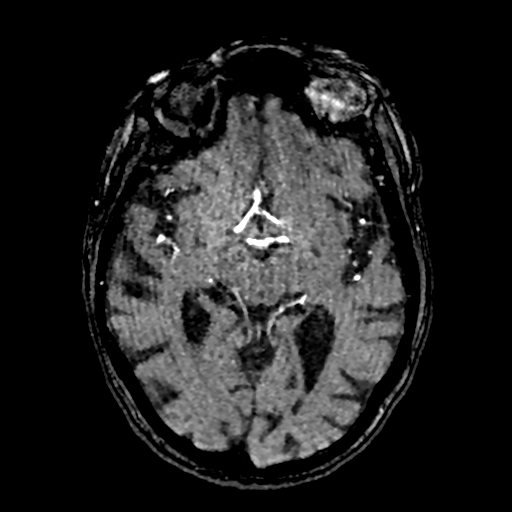
[im 169/205]
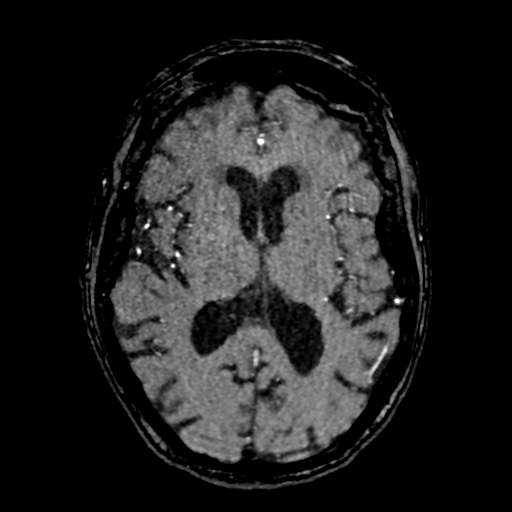
[im 178/205]
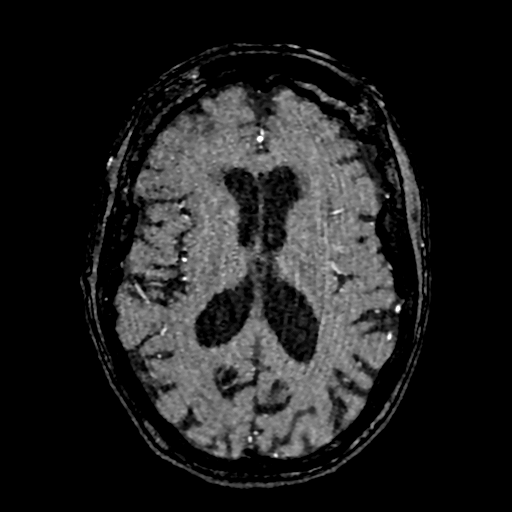
[im 196/205]
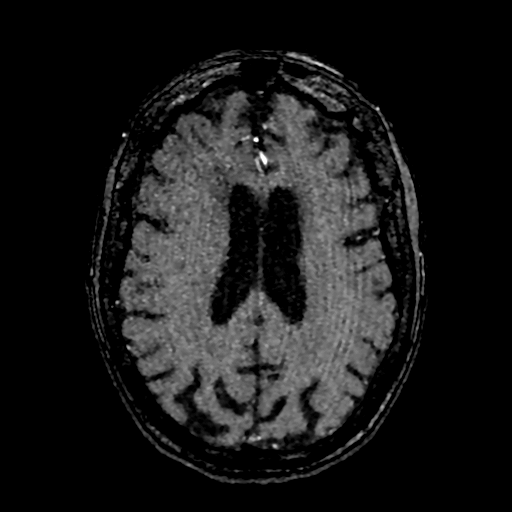

[Series 10: TOF · axial · 0.5mm · 0.41mm/px · z∈[-89,-5]mm · 9 of 205 slices shown (2 of 2)]
[im 9/205]
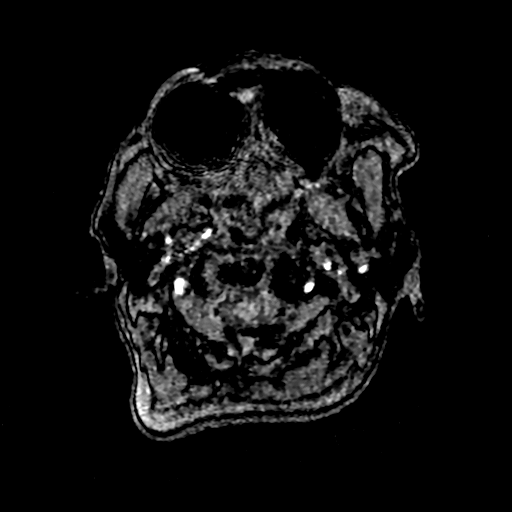
[im 36/205]
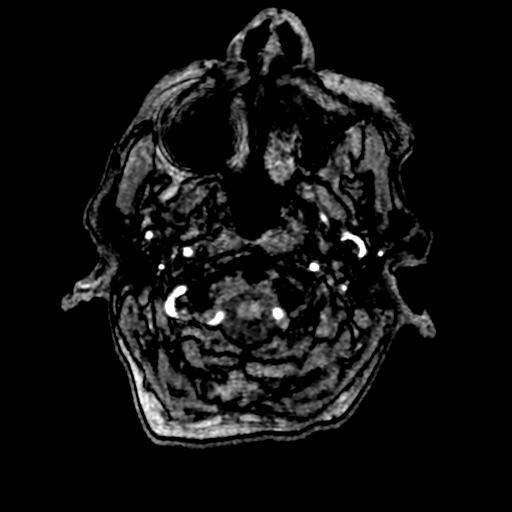
[im 63/205]
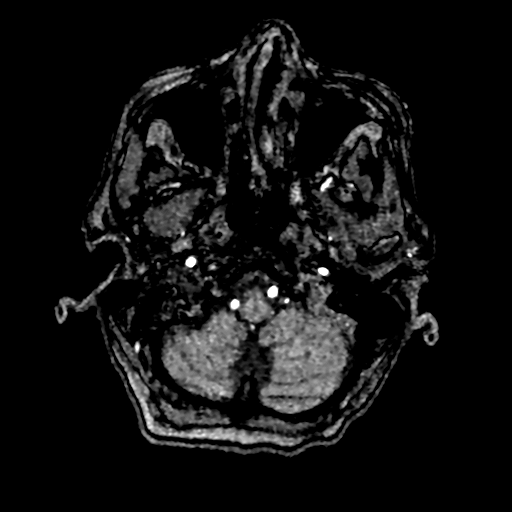
[im 89/205]
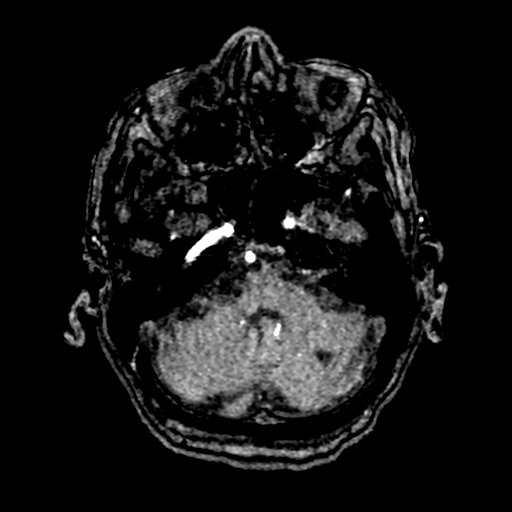
[im 107/205]
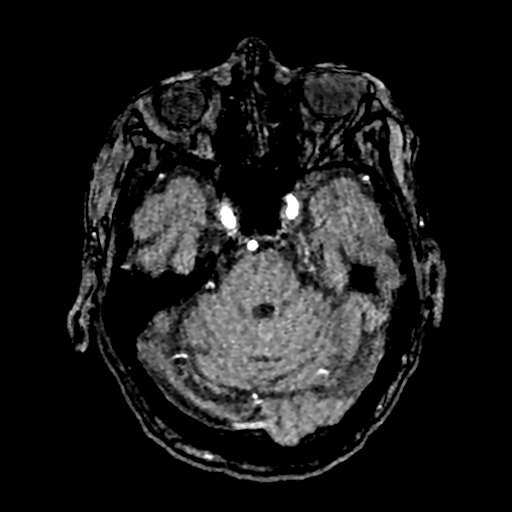
[im 116/205]
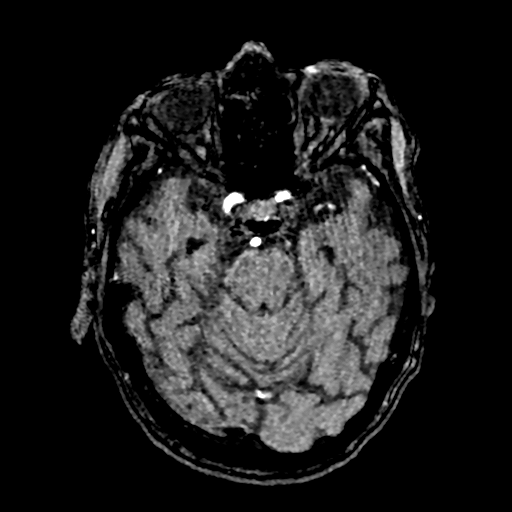
[im 142/205]
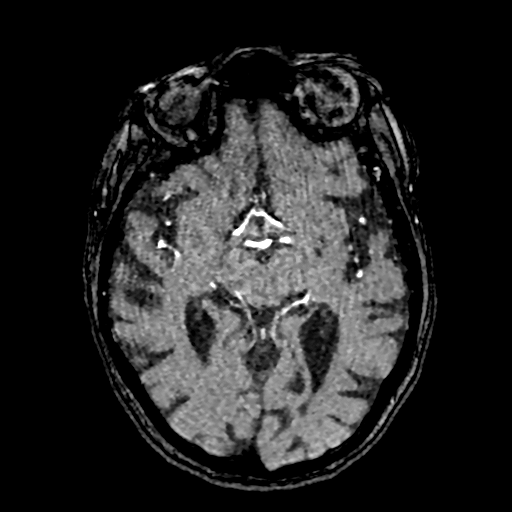
[im 169/205]
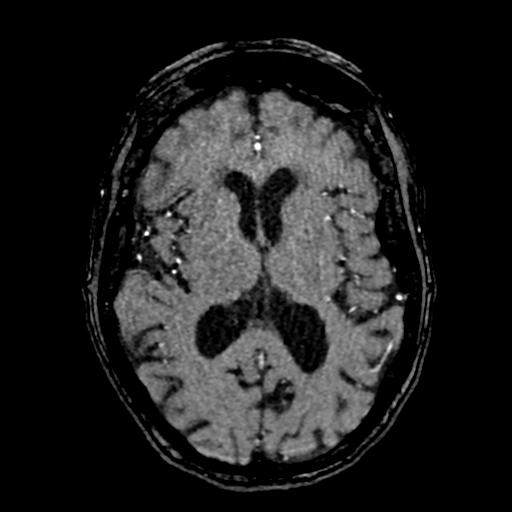
[im 178/205]
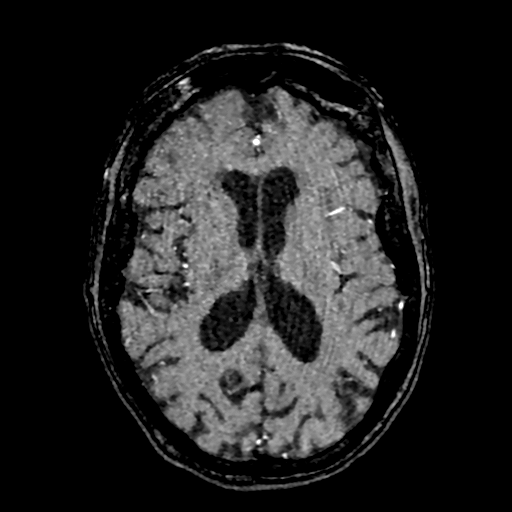

[19 of 48 positions shown; findings below may reference images not displayed]

FINDINGS: Anterior circulation: Both internal carotid arteries widely patent
through the skull base and siphon regions. Anterior and middle
cerebral vessels show flow. Moderate atherosclerotic irregularity of
the more distal branch vessels.

Posterior circulation: Both vertebral arteries widely patent to the
basilar. Artifactual signal loss in both distal vertebral artery V4
segments. No basilar stenosis. Posterior circulation branch vessels
show flow. Flow present in both superior cerebellar and posterior
cerebral arteries. There is severe stenotic disease within both P1
P2 junction regions.

Anatomic variants: None significant

Other: None.
IMPRESSION: Both vertebral arteries appear widely patent to the basilar. No
basilar stenosis. Severe stenotic disease within the posterior
cerebral arteries, particularly at the P1 P2 junction regions.

## 2020-09-11 MED ORDER — SODIUM CHLORIDE 0.9 % IV SOLN: INTRAVENOUS | Status: AC

## 2020-09-11 MED ORDER — CLOPIDOGREL BISULFATE 75 MG PO TABS
75.0000 mg | ORAL_TABLET | Freq: Every day | ORAL | Status: DC
  Administered 2020-09-11 – 2020-09-12 (×2): 75 mg via ORAL
  Filled 2020-09-11 (×2): qty 1

## 2020-09-11 MED ORDER — SODIUM CHLORIDE 0.9 % IV BOLUS (SEPSIS): 1000.0000 mL | Freq: Once | INTRAVENOUS | Status: DC

## 2020-09-11 MED ORDER — ROSUVASTATIN CALCIUM 20 MG PO TABS
20.0000 mg | ORAL_TABLET | Freq: Every day | ORAL | Status: DC
  Administered 2020-09-11 – 2020-09-12 (×2): 20 mg via ORAL
  Filled 2020-09-11 (×2): qty 1

## 2020-09-11 MED ORDER — ONDANSETRON HCL 4 MG/2ML IJ SOLN: 4.0000 mg | Freq: Four times a day (QID) | INTRAMUSCULAR | Status: DC | PRN

## 2020-09-11 MED ORDER — CALCIUM GLUCONATE-NACL 1-0.675 GM/50ML-% IV SOLN
1.0000 g | Freq: Once | INTRAVENOUS | Status: AC
  Administered 2020-09-11: 1000 mg via INTRAVENOUS
  Filled 2020-09-11: qty 50

## 2020-09-11 MED ORDER — STROKE: EARLY STAGES OF RECOVERY BOOK: Freq: Once | Status: DC

## 2020-09-11 MED ORDER — ROSUVASTATIN CALCIUM 20 MG PO TABS
20.0000 mg | ORAL_TABLET | Freq: Every day | ORAL | Status: DC
  Filled 2020-09-11: qty 1

## 2020-09-11 MED ORDER — ASPIRIN 81 MG PO CHEW: 81.0000 mg | CHEWABLE_TABLET | Freq: Every day | ORAL | Status: DC

## 2020-09-11 MED ORDER — SODIUM CHLORIDE 0.9 % IV BOLUS (SEPSIS)
250.0000 mL | Freq: Once | INTRAVENOUS | Status: AC
  Administered 2020-09-11: 250 mL via INTRAVENOUS

## 2020-09-11 MED ORDER — ASPIRIN 81 MG PO CHEW
324.0000 mg | CHEWABLE_TABLET | Freq: Once | ORAL | Status: AC
  Administered 2020-09-11: 324 mg via ORAL
  Filled 2020-09-11: qty 4

## 2020-09-11 MED ORDER — SODIUM CHLORIDE 0.9 % IV BOLUS (SEPSIS)
500.0000 mL | Freq: Once | INTRAVENOUS | Status: AC
  Administered 2020-09-11: 500 mL via INTRAVENOUS

## 2020-09-11 MED ORDER — ENOXAPARIN SODIUM 60 MG/0.6ML IJ SOSY
1.0000 mg/kg | PREFILLED_SYRINGE | Freq: Once | INTRAMUSCULAR | Status: AC
  Administered 2020-09-11: 52.5 mg via SUBCUTANEOUS
  Filled 2020-09-11: qty 0.6

## 2020-09-11 MED ORDER — CALCIUM CARBONATE-VITAMIN D 500-200 MG-UNIT PO TABS
1.0000 | ORAL_TABLET | Freq: Two times a day (BID) | ORAL | Status: DC
  Filled 2020-09-11 (×2): qty 1

## 2020-09-11 MED ORDER — CALCIUM CARBONATE-VITAMIN D 500-200 MG-UNIT PO TABS
1.0000 | ORAL_TABLET | Freq: Two times a day (BID) | ORAL | Status: DC
  Administered 2020-09-11 – 2020-09-12 (×4): 1 via ORAL
  Filled 2020-09-11 (×5): qty 1

## 2020-09-11 MED ORDER — MAGNESIUM HYDROXIDE 400 MG/5ML PO SUSP
30.0000 mL | Freq: Every day | ORAL | Status: DC | PRN
  Filled 2020-09-11: qty 30

## 2020-09-11 MED ORDER — HYDROCOD POLST-CPM POLST ER 10-8 MG/5ML PO SUER: 5.0000 mL | Freq: Two times a day (BID) | ORAL | Status: DC | PRN

## 2020-09-11 MED ORDER — CLOPIDOGREL BISULFATE 75 MG PO TABS: 75.0000 mg | ORAL_TABLET | Freq: Every day | ORAL | Status: DC

## 2020-09-11 MED ORDER — SODIUM CHLORIDE 0.9 % IV SOLN: INTRAVENOUS | Status: DC

## 2020-09-11 MED ORDER — GUAIFENESIN ER 600 MG PO TB12
600.0000 mg | ORAL_TABLET | Freq: Two times a day (BID) | ORAL | Status: DC
  Administered 2020-09-11 – 2020-09-12 (×4): 600 mg via ORAL
  Filled 2020-09-11 (×4): qty 1

## 2020-09-11 MED ORDER — POTASSIUM CHLORIDE 20 MEQ PO PACK
40.0000 meq | PACK | Freq: Once | ORAL | Status: AC
  Administered 2020-09-11: 40 meq via ORAL
  Filled 2020-09-11: qty 2

## 2020-09-11 MED ORDER — CYANOCOBALAMIN 500 MCG PO TABS
250.0000 ug | ORAL_TABLET | Freq: Every day | ORAL | Status: DC
  Administered 2020-09-11 – 2020-09-12 (×2): 250 ug via ORAL
  Filled 2020-09-11 (×2): qty 1

## 2020-09-11 MED ORDER — POTASSIUM CHLORIDE 10 MEQ/100ML IV SOLN
10.0000 meq | INTRAVENOUS | Status: AC
  Administered 2020-09-11 (×2): 10 meq via INTRAVENOUS
  Filled 2020-09-11 (×2): qty 100

## 2020-09-11 MED ORDER — CYANOCOBALAMIN 500 MCG PO TABS
250.0000 ug | ORAL_TABLET | Freq: Every day | ORAL | Status: DC
  Filled 2020-09-11: qty 1

## 2020-09-11 MED ORDER — ONDANSETRON HCL 4 MG PO TABS: 4.0000 mg | ORAL_TABLET | Freq: Four times a day (QID) | ORAL | Status: DC | PRN

## 2020-09-11 MED ORDER — SODIUM CHLORIDE 0.9 % IV SOLN
500.0000 mg | INTRAVENOUS | Status: DC
  Administered 2020-09-11 – 2020-09-13 (×3): 500 mg via INTRAVENOUS
  Filled 2020-09-11 (×3): qty 500

## 2020-09-11 MED ORDER — SODIUM CHLORIDE 0.9 % IV SOLN
500.0000 mg | Freq: Once | INTRAVENOUS | Status: DC
  Filled 2020-09-11: qty 500

## 2020-09-11 MED ORDER — SODIUM CHLORIDE 0.9 % IV SOLN: 500.0000 mg | INTRAVENOUS | Status: DC

## 2020-09-11 MED ORDER — TRAZODONE HCL 50 MG PO TABS: 25.0000 mg | ORAL_TABLET | Freq: Every evening | ORAL | Status: DC | PRN

## 2020-09-11 MED ORDER — NITROGLYCERIN 0.4 MG SL SUBL: 0.4000 mg | SUBLINGUAL_TABLET | SUBLINGUAL | Status: DC | PRN

## 2020-09-11 MED ORDER — SODIUM CHLORIDE 0.9 % IV BOLUS
1000.0000 mL | Freq: Once | INTRAVENOUS | Status: AC
  Administered 2020-09-11: 1000 mL via INTRAVENOUS

## 2020-09-11 MED ORDER — ENOXAPARIN SODIUM 40 MG/0.4ML IJ SOSY
40.0000 mg | PREFILLED_SYRINGE | INTRAMUSCULAR | Status: DC
  Administered 2020-09-12 – 2020-09-13 (×2): 40 mg via SUBCUTANEOUS
  Filled 2020-09-11 (×2): qty 0.4

## 2020-09-11 MED ORDER — ACETAMINOPHEN 650 MG RE SUPP: 650.0000 mg | Freq: Four times a day (QID) | RECTAL | Status: DC | PRN

## 2020-09-11 MED ORDER — ACETAMINOPHEN 325 MG PO TABS: 650.0000 mg | ORAL_TABLET | Freq: Four times a day (QID) | ORAL | Status: DC | PRN

## 2020-09-11 MED ORDER — SODIUM CHLORIDE 0.9 % IV SOLN
2.0000 g | INTRAVENOUS | Status: DC
  Administered 2020-09-11 – 2020-09-13 (×3): 2 g via INTRAVENOUS
  Filled 2020-09-11 (×3): qty 20
  Filled 2020-09-11: qty 2

## 2020-09-11 MED ORDER — SODIUM CHLORIDE 0.9 % IV SOLN: 2.0000 g | INTRAVENOUS | Status: DC

## 2020-09-11 MED ORDER — SODIUM CHLORIDE 0.9 % IV SOLN
1.0000 g | Freq: Once | INTRAVENOUS | Status: DC
  Filled 2020-09-11: qty 10

## 2020-09-11 NOTE — ED Notes (Signed)
Patient to MRI at this time.

## 2020-09-11 NOTE — ED Notes (Signed)
Message sent to pharmacy to re-time medications just now verified.

## 2020-09-11 NOTE — ED Notes (Signed)
Patient transported to MRI 

## 2020-09-11 NOTE — ED Notes (Signed)
1C charge nurse, Zella Ball, notified of admission delay.

## 2020-09-11 NOTE — ED Notes (Signed)
Portable xray performed at bedside at this time.

## 2020-09-11 NOTE — Sepsis Progress Note (Signed)
Notified provider of need to order repeat lactic acid. ° °

## 2020-09-11 NOTE — ED Notes (Signed)
US at bedside

## 2020-09-11 NOTE — Sepsis Progress Note (Signed)
Following for Code sepsis  

## 2020-09-11 NOTE — ED Notes (Signed)
Patient transported to CT 

## 2020-09-11 NOTE — ED Notes (Signed)
Pharmacy tech at bedside 

## 2020-09-11 NOTE — ED Notes (Signed)
Patient is resting comfortably. Sister in law, who is patient's POA is at bedside. POA reports patient has advanced dementia. She also states the patient has had more frequent falls recently, despite a 24/7 caregiver in the home.

## 2020-09-11 NOTE — Progress Notes (Signed)
SLP Cancellation Note  Patient Details Name: Elizabeth [name] MRN: [id number] DOB: 03/05/1928   Cancelled treatment:       Reason Eval/Treat Not Completed: SLP screened, no needs identified, will sign off  At this time, it doesn't appear that pt has any acute neurological cognitive decline over baseline. Her pain and multiple recent hospitalizations as well as reported general decline can increase confusion among pt's with advanced dementia.     Kaisa Wofford B. Dreama Saa M.S., CCC-SLP, [facility] Speech-Language Pathologist Rehabilitation Services Office [phone]  Aariv Medlock Dreama Saa 09/11/2020, 11:29 AM

## 2020-09-11 NOTE — Consult Note (Signed)
Neurology Consultation  Reason for Consult: right hemiparesis Referring Physician: Dr Andrez Grime   CC: weakness, confusion  History is obtained from: Chart, family at bedside  HPI: Elizabeth [name] is a 87 y.o. female who has a past medical history is significant for advanced dementia, coronary artery disease and a prior history of stroke, presented to the emergency room for evaluation of weakness and recurrent falls.  She also was having poor appetite prior to presentation. The family member at bedside said that she is not feeling well since Wednesday of this week and had recurrent falls along with right extremity bruising and swelling that has been now progressive for the last couple of days prior to presentation. She also had some difficulty swallowing and her appetite was reduced leading her to not eating or drinking much during the last 2 days. She is noted to have right hemiparesis, for which neurological consultation was called. X-ray of the right humerus showed poorly visualized displaced fracture of the greater tuberosity and humeral neck Chest x-ray concerning for pneumonia MRI of the brain was done that showed a small acute infarct in the anterior left paracentral pons and the basilar artery perforator territory.  No associated hemorrhage or mass-effect.  At baseline she has advanced dementia, lives at home with 24/7 care, is able to feed herself some but requires assistance for every other activity.  LKW: Multiple days prior to presentation tpa given?: no, outside the window Premorbid modified Rankin scale (mRS): 4 to 5 ROS:  Unable to obtain due to advanced dementia  Past Medical History:  Diagnosis Date  . CAD (coronary artery disease)    a.  Inferolateral STEMI 09/02/2019  . Dementia (HCC)   . History of CVA (cerebrovascular accident)   . Hyperlipidemia LDL goal <70       No family history on file.   Social History:   reports that she has never smoked. She has  never used smokeless tobacco. She reports that she does not drink alcohol. No history on file for drug use.  Medications  Current Facility-Administered Medications:  .   stroke: mapping our early stages of recovery book, , Does not apply, Once, Mansy, Jan A, MD .  0.9 %  sodium chloride infusion, , Intravenous, Continuous, Irean Hong, MD, Held at 09/11/20 [phone] .  0.9 %  sodium chloride infusion, , Intravenous, Continuous, Mansy, Jan A, MD, Last Rate: 100 mL/hr at 09/11/20 0151, New Bag at 09/11/20 0151 .  acetaminophen (TYLENOL) tablet 650 mg, 650 mg, Oral, Q6H PRN **OR** acetaminophen (TYLENOL) suppository 650 mg, 650 mg, Rectal, Q6H PRN, Mansy, Jan A, MD .  aspirin chewable tablet 81 mg, 81 mg, Oral, Daily, Mansy, Jan A, MD .  azithromycin (ZITHROMAX) 500 mg in sodium chloride 0.9 % 250 mL IVPB, 500 mg, Intravenous, Q24H, Irean Hong, MD, Stopped at 09/11/20 0142 .  B-12 TABS, , Oral, Q1200, Mansy, Jan A, MD .  calcium-vitamin D (OSCAL WITH D) 500-200 MG-UNIT per tablet 1 tablet, 1 tablet, Oral, BID, Mansy, Jan A, MD .  cefTRIAXone (ROCEPHIN) 2 g in sodium chloride 0.9 % 100 mL IVPB, 2 g, Intravenous, Q24H, Irean Hong, MD, Stopped at 09/11/20 0242 .  chlorpheniramine-HYDROcodone (TUSSIONEX) 10-8 MG/5ML suspension 5 mL, 5 mL, Oral, Q12H PRN, Mansy, Jan A, MD .  clopidogrel (PLAVIX) tablet 75 mg, 75 mg, Oral, Daily, Mansy, Jan A, MD .  enoxaparin (LOVENOX) injection 40 mg, 40 mg, Subcutaneous, Q24H, Mansy, Vernetta Honey, MD .  guaiFENesin (MUCINEX) 12 hr tablet 600 mg, 600 mg, Oral, BID, Mansy, Jan A, MD .  magnesium hydroxide (MILK OF MAGNESIA) suspension 30 mL, 30 mL, Oral, Daily PRN, Mansy, Jan A, MD .  nitroGLYCERIN (NITROSTAT) SL tablet 0.4 mg, 0.4 mg, Sublingual, Q5 min PRN, Mansy, Jan A, MD .  ondansetron (ZOFRAN) tablet 4 mg, 4 mg, Oral, Q6H PRN **OR** ondansetron (ZOFRAN) injection 4 mg, 4 mg, Intravenous, Q6H PRN, Mansy, Jan A, MD .  rosuvastatin (CRESTOR) tablet 20 mg, 20 mg, Oral,  Daily, Mansy, Jan A, MD .  sodium chloride flush (NS) 0.9 % injection 3 mL, 3 mL, Intravenous, Once, Dolores Frame, Jade J, MD .  traZODone (DESYREL) tablet 25 mg, 25 mg, Oral, QHS PRN, Mansy, Jan A, MD  Current Outpatient Medications:  .  aspirin 81 MG chewable tablet, Chew 1 tablet (81 mg total) by mouth daily., Disp:  , Rfl:  .  clopidogrel (PLAVIX) 75 MG tablet, Take 1 tablet PO QD, Disp: 90 tablet, Rfl: 0 .  Cyanocobalamin (B-12 PO), Take by mouth daily at 12 noon., Disp: , Rfl:  .  nitroGLYCERIN (NITROSTAT) 0.4 MG SL tablet, Place 1 tablet (0.4 mg total) under the tongue every 5 (five) minutes as needed for chest pain., Disp: 25 tablet, Rfl: 0 .  rosuvastatin (CRESTOR) 20 MG tablet, Take 1 tablet (20 mg total) by mouth daily., Disp: 90 tablet, Rfl: 0   Exam: Current vital signs: BP (!) 148/76   Pulse 65   Temp 97.6 F (36.4 C) (Oral)   Resp 13   Ht 5\' 2"  (1.575 m)   Wt 53.4 kg   SpO2 99%   BMI 21.55 kg/m  Vital signs in last 24 hours: Temp:  [97.6 F (36.4 C)] 97.6 F (36.4 C) (06/03 2302) Pulse Rate:  [65-88] 65 (06/04 0930) Resp:  [13-22] 13 (06/04 0930) BP: (122-152)/(75-96) 148/76 (06/04 0930) SpO2:  [88 %-100 %] 99 % (06/04 0930) Weight:  [53.4 kg] 53.4 kg (06/03 2300) General: Awake, alert, no distress HEENT: Normocephalic atraumatic Lungs: Clear CVs regular rate rhythm Abdomen nondistended nontender Extremities: Right arm in a sling Neurological exam Awake, alert and oriented to self. Could not tell me her age, today's date or why she is here. Speech is mildly dysarthric Extremely poor attention concentration and very slow to respond to questions. Difficulty naming objects and following commands Cranial nerves: Pupils equal round react light, visual fields appear full, extraocular movements appeared unhindered, face appears symmetric. Motor examination with significant pain and weakness of the right upper extremity-which is also in a sling.  Right lower extremity  also is weak without drift.  Left upper and lower extremity appear full strength Sensation appears intact Difficult assess coordination given her mentation and limited ability to follow commands NIHSS 1a Level of Conscious.: 0 1b LOC Questions: 2 1c LOC Commands: 1 2 Best Gaze: 0 3 Visual: 0 4 Facial Palsy: 0 5a Motor Arm - left: 0 5b Motor Arm - Right: 2 6a Motor Leg - Left: 0 6b Motor Leg - Right: 1 7 Limb Ataxia: 0 8 Sensory: 0 9 Best Language: 1 10 Dysarthria: 1 11 Extinct. and Inatten.: 0 TOTAL: 8   Labs I have reviewed labs in epic and the results pertinent to this consultation are:   CBC-anemia with hemoglobin 11, no leukocytosis. Lactate 2.4.    Component Value Date/Time   WBC 9.9 09/11/2020 0536   RBC 3.53 (L) 09/11/2020 0536   HGB 11.0 (L) 09/11/2020 0536  HGB 11.2 01/05/2020 1217   HCT 32.1 (L) 09/11/2020 0536   HCT 35.2 01/05/2020 1217   PLT 166 09/11/2020 0536   PLT 223 01/05/2020 1217   MCV 90.9 09/11/2020 0536   MCV 92 01/05/2020 1217   MCH 31.2 09/11/2020 0536   MCHC 34.3 09/11/2020 0536   RDW 15.2 09/11/2020 0536   RDW 14.6 01/05/2020 1217   LYMPHSABS 1.1 09/10/2020 2306   LYMPHSABS 1.6 01/05/2020 1217   MONOABS 1.7 (H) 09/10/2020 2306   EOSABS 0.0 09/10/2020 2306   EOSABS 0.4 01/05/2020 1217   BASOSABS 0.0 09/10/2020 2306   BASOSABS 0.0 01/05/2020 1217    CMP     Component Value Date/Time   NA 142 09/11/2020 0536   NA 145 (H) 05/05/2020 1225   K 2.8 (L) 09/11/2020 0536   CL 116 (H) 09/11/2020 0536   CO2 22 09/11/2020 0536   GLUCOSE 118 (H) 09/11/2020 0536   BUN 20 09/11/2020 0536   BUN 12 05/05/2020 1225   CREATININE 0.46 09/11/2020 0536   CALCIUM 6.0 (LL) 09/11/2020 0536   PROT 7.0 09/10/2020 2306   PROT 6.2 01/05/2020 1217   ALBUMIN 3.6 09/10/2020 2306   ALBUMIN 3.6 01/05/2020 1217   AST 37 09/10/2020 2306   ALT 25 09/10/2020 2306   ALKPHOS 62 09/10/2020 2306   BILITOT 2.5 (H) 09/10/2020 2306   BILITOT 0.4 01/05/2020 1217    GFRNONAA >60 09/11/2020 0536   GFRAA 75 05/05/2020 1225    Lipid Panel     Component Value Date/Time   CHOL 85 09/11/2020 0536   CHOL 132 05/05/2020 1225   TRIG 54 09/11/2020 0536   HDL 42 09/11/2020 0536   HDL 48 05/05/2020 1225   CHOLHDL 2.0 09/11/2020 0536   VLDL 11 09/11/2020 0536   LDLCALC 32 09/11/2020 0536   LDLCALC 68 05/05/2020 1225   Hypokalemia, hypocalcemia, hyperglycemia.  Hypomagnesemia.  Urinalysis negative for UTI.  Imaging I have reviewed the images obtained: MRI examination of the brain IMPRESSION: 1. Small acute infarct in the anterior left paracentral pons, basilar perforator artery territory. No associated hemorrhage or mass effect. 2. Otherwise stable MRI appearance of chronic small vessel disease since last year.  Carotid Dopplers IMPRESSION: Bilateral carotid bifurcation atherosclerosis but Doppler criteria indicate less than 50% stenosis.  Assessment: 87 year old with above past medical history presenting for greater 24 hours of weakness, falls and diminished appetite noted to have a left anterior pontine stroke. Given her advanced age, advanced dementia and cerebrovascular risk factor history, etiology of the stroke is likely small vessel but an embolic etiology cannot be ruled out. At baseline she is very disabled requiring 24/7 help. Carotid Dopplers unremarkable Rest of the stroke work-up has been ordered and pending.  Impression  Acute ischemic stroke involving the anterior pons on the left with ensuing right hemiparesis  Etiology likely small vessel disease but embolic source cannot be ruled out without full work-up  Advanced dementia with poor baseline functional status  CAP versus aspiration pneumonia  History of coronary artery disease and prior strokes.  Recommendations:  Continue home dual antiplatelets  LDL 32, goal less than 70-continue home statin  2D echo pending-if there is any evidence of an embolic source,  might need anticoagulation for stroke prevention but given her extremely debilitated baseline and advanced dementia, I would be hesitant to put her on anticoagulation.  A1c pending-goal less than 7  For head vessel imaging-I would recommend an MRA of the head without contrast.  PT OT  speech therapy No need for permissive hypertension-goal blood pressure at discharge is normotension. Management of pneumonia per primary team as you are  I will follow-up on the remainder of the work-up and update recommendations as needed.  Plan relayed to patient's hospitalist Dr. Barrie Folk via secure chat  - Milon Dikes, MD Neurologist [facility] Pager: [phone]

## 2020-09-11 NOTE — ED Notes (Signed)
Weakness to right arm documented in NIHSS may be related to patient's pain from fall. Patient appears tearful when right arm is moved. Patient noted to have significant ecchymosis to the right arm, and right shoulder, and reports tenderness to touch. Dr. Dolores Frame aware.

## 2020-09-11 NOTE — Progress Notes (Signed)
CODE SEPSIS - PHARMACY COMMUNICATION  **Broad Spectrum Antibiotics should be administered within 1 hour of Sepsis diagnosis**  Time Code Sepsis Called/Page Received: 3953  Antibiotics Ordered: Azithromycin and Ceftriaxone  Time of 1st antibiotic administration: 2023  Otelia Sergeant, PharmD, MBA 09/11/2020 1:18 AM

## 2020-09-11 NOTE — ED Notes (Signed)
Meal tray at bedside.  

## 2020-09-11 NOTE — ED Notes (Signed)
Dr. Arville Care made aware of patient pain and ecchymosis to RUE. DG right humerus ordered verbally by Dr. Arville Care at bedside.

## 2020-09-11 NOTE — Progress Notes (Addendum)
PROGRESS NOTE  Elizabeth [name] GBE:[id number] DOB: 04-Jul-1927 DOA: 09/10/2020 PCP: Barbette Reichmann, MD  HPI/Recap of past 24 hours: This is a 87 year old Caucasian female who was admitted earlier this morning with acute onset generalized weakness recurrent fall as well as right upper extremity weakness..  Initial blood oxygenation was 88%.  Her lactic acid was elevated she received some IV fluid she has leukocytosis and tachycardia as a result she was admitted for sepsis initially was admitted slated for progressive bed.  Patient seen and examined at bedside this morning she is alert she is communicating there is some speech impediment which is old does not have any acute distress she stated she felt fine denies any fever or chills  Assessment/Plan: Active Problems:   CAP (community acquired pneumonia)  #1 community-acquired pneumonia Her lactic acid was initially 3.4 currently is 2.4 she is receiving IV fluid we will recheck that Continue ceftriaxone and azithromycin Hypoxia is improved continue O2 nasal cannula Blood cultures and sputum cultures are pending.  2.  Right-sided hemiparesis Patient has been seen by neurology MRI has been done is following he does not recommend permissive blood pressure Giving her debilitated baseline and advanced dementia he did not start her on anticoagulation PT/ OT consulted as well as speech Echocardiogram is pending  3.  Elevated troponin this might be due to demand ischemia  and sepsis    4. Right humeral fracture . Will consult orthopedic    5.mild  Hypertension. Will monitor.  Code Status: Full  Severity of Illness: The appropriate patient status for this patient is INPATIENT. Inpatient status is judged to be reasonable and necessary in order to provide the required intensity of service to ensure the patient's safety. The patient's presenting symptoms, physical exam findings, and initial radiographic and laboratory data in the  context of their chronic comorbidities is felt to place them at high risk for further clinical deterioration. Furthermore, it is not anticipated that the patient will be medically stable for discharge from the hospital within 2 midnights of admission. The following factors support the patient status of inpatient.   " The patient's presenting symptoms include hypoxia tachycardia. " The worrisome physical exam findings include patient was hypoxic tachycardic and weakness worrisome for sepsis.  And stroke " The initial radiographic and laboratory data are worrisome because of possible stroke " The chronic co-morbidities include dementia.   * I certify that at the point of admission it is my clinical judgment that the patient will require inpatient hospital care spanning beyond 2 midnights from the point of admission due to high intensity of service, high risk for further deterioration and high frequency of surveillance required.*    Family Communication: None at bedside  Disposition Plan: We will admit to inpatient downgrading her to telemetry   Consultants:  Neurology  Procedures:  MRI  Antimicrobials:  Ceftriaxone and azithromycin  DVT prophylaxis: Lovenox   Objective: Vitals:   09/11/20 0616 09/11/20 0630 09/11/20 0930 09/11/20 1200  BP: (!) 122/92 (!) 152/81 (!) 148/76 (!) 116/102  Pulse: 71 71 65 70  Resp: 15 14 13 14   Temp:      TempSrc:      SpO2: 100% 100% 99% 98%  Weight:      Height:        Intake/Output Summary (Last 24 hours) at 09/11/2020 1350 Last data filed at 09/11/2020 1003 Gross per 24 hour  Intake 50 ml  Output --  Net 50 ml   11/11/2020  09/10/20 2300  Weight: 53.4 kg   Body mass index is 21.55 kg/m.  Exam:  . General: 87 y.o. year-old female well developed well nourished in no acute distress.  Alert and oriented x3.  Frail looking . Cardiovascular: Regular rate and rhythm with no rubs or gallops.  No thyromegaly or JVD noted.    Marland Kitchen Respiratory: Clear to auscultation with no wheezes or rales. Good inspiratory effort. . Abdomen: Soft nontender nondistended with normal bowel sounds x4 quadrants. . Musculoskeletal: No lower extremity edema. 2/4 pulses in all 4 extremities. . Right arm in a sling . Skin: No ulcerative lesions noted or rashes, . Neurology.  Slight speech impediment . Psychiatry: Mood is appropriate for condition and setting    Data Reviewed: CBC: Recent Labs  Lab 09/10/20 2306 09/11/20 0536  WBC 12.4* 9.9  NEUTROABS 9.5*  --   HGB 12.3 11.0*  HCT 36.4 32.1*  MCV 90.8 90.9  PLT 198 166   Basic Metabolic Panel: Recent Labs  Lab 09/10/20 2306 09/11/20 0536  NA 136 142  K 3.4* 2.8*  CL 100 116*  CO2 24 22  GLUCOSE 224* 118*  BUN 30* 20  CREATININE 0.61 0.46  CALCIUM 8.7* 6.0*  MG  --  1.5*   GFR: Estimated Creatinine Clearance: 36.2 mL/min (by C-G formula based on SCr of 0.46 mg/dL). Liver Function Tests: Recent Labs  Lab 09/10/20 2306  AST 37  ALT 25  ALKPHOS 62  BILITOT 2.5*  PROT 7.0  ALBUMIN 3.6   No results for input(s): LIPASE, AMYLASE in the last 168 hours. No results for input(s): AMMONIA in the last 168 hours. Coagulation Profile: Recent Labs  Lab 09/10/20 2321 09/11/20 0536  INR 1.1 1.3*   Cardiac Enzymes: No results for input(s): CKTOTAL, CKMB, CKMBINDEX, TROPONINI in the last 168 hours. BNP (last 3 results) No results for input(s): PROBNP in the last 8760 hours. HbA1C: No results for input(s): HGBA1C in the last 72 hours. CBG: No results for input(s): GLUCAP in the last 168 hours. Lipid Profile: Recent Labs    09/11/20 0536  CHOL 85  HDL 42  LDLCALC 32  TRIG 54  CHOLHDL 2.0   Thyroid Function Tests: No results for input(s): TSH, T4TOTAL, FREET4, T3FREE, THYROIDAB in the last 72 hours. Anemia Panel: No results for input(s): VITAMINB12, FOLATE, FERRITIN, TIBC, IRON, RETICCTPCT in the last 72 hours. Urine analysis:    Component Value  Date/Time   COLORURINE YELLOW (A) 09/10/2020 2323   APPEARANCEUR HAZY (A) 09/10/2020 2323   LABSPEC 1.029 09/10/2020 2323   PHURINE 5.0 09/10/2020 2323   GLUCOSEU 150 (A) 09/10/2020 2323   HGBUR MODERATE (A) 09/10/2020 2323   BILIRUBINUR NEGATIVE 09/10/2020 2323   KETONESUR 5 (A) 09/10/2020 2323   PROTEINUR 100 (A) 09/10/2020 2323   NITRITE NEGATIVE 09/10/2020 2323   LEUKOCYTESUR NEGATIVE 09/10/2020 2323   Sepsis Labs: @LABRCNTIP (procalcitonin:4,lacticidven:4)  ) Recent Results (from the past 240 hour(s))  Culture, blood (routine x 2)     Status: None (Preliminary result)   Collection Time: 09/10/20 11:05 PM   Specimen: BLOOD  Result Value Ref Range Status   Specimen Description BLOOD BLOOD RIGHT FOREARM  Final   Special Requests   Final    BOTTLES DRAWN AEROBIC AND ANAEROBIC Blood Culture results may not be optimal due to an inadequate volume of blood received in culture bottles   Culture   Final    NO GROWTH < 12 HOURS Performed at [facility], [address] [place], [place] [address]    Report Status PENDING  Incomplete  Resp Panel by RT-PCR (Flu A&B, Covid) Nasopharyngeal Swab     Status: None   Collection Time: 09/10/20 11:23 PM   Specimen: Nasopharyngeal Swab; [place](NP) swabs in vial transport medium  Result Value Ref Range Status   SARS Coronavirus 2 by RT PCR NEGATIVE NEGATIVE Final    Comment: (NOTE) SARS-CoV-2 target nucleic acids are NOT DETECTED.  The SARS-CoV-2 RNA is generally detectable in upper respiratory specimens during the acute phase of infection. The lowest concentration of SARS-CoV-2 viral copies this assay can detect is 138 copies/mL. A negative result does not preclude SARS-Cov-2 infection and should not be used as the sole basis for treatment or other patient management decisions. A negative result may occur with  improper specimen collection/handling, submission of specimen other than nasopharyngeal swab, presence of viral  mutation(s) within the areas targeted by this assay, and inadequate number of viral copies(<138 copies/mL). A negative result must be combined with clinical observations, patient history, and epidemiological information. The expected result is Negative.  Fact Sheet for Patients:  BloggerCourse.comhttps://www.fda.gov/media/152166/download  Fact Sheet for Healthcare Providers:  SeriousBroker.ithttps://www.fda.gov/media/152162/download  This test is no t yet approved or cleared by the [place] FDA and  has been authorized for detection and/or diagnosis of SARS-CoV-2 by FDA under an Emergency Use Authorization (EUA). This EUA will remain  in effect (meaning this test can be used) for the duration of the COVID-19 declaration under Section 564(b)(1) of the Act, 21 U.S.C.section 360bbb-3(b)(1), unless the authorization is terminated  or revoked sooner.       Influenza A by PCR NEGATIVE NEGATIVE Final   Influenza B by PCR NEGATIVE NEGATIVE Final    Comment: (NOTE) The Xpert Xpress SARS-CoV-2/FLU/RSV plus assay is intended as an aid in the diagnosis of influenza from Nasopharyngeal swab specimens and should not be used as a sole basis for treatment. Nasal washings and aspirates are unacceptable for Xpert Xpress SARS-CoV-2/FLU/RSV testing.  Fact Sheet for Patients: BloggerCourse.comhttps://www.fda.gov/media/152166/download  Fact Sheet for Healthcare Providers: SeriousBroker.ithttps://www.fda.gov/media/152162/download  This test is not yet approved or cleared by the [place] FDA and has been authorized for detection and/or diagnosis of SARS-CoV-2 by FDA under an Emergency Use Authorization (EUA). This EUA will remain in effect (meaning this test can be used) for the duration of the COVID-19 declaration under Section 564(b)(1) of the Act, 21 U.S.C. section 360bbb-3(b)(1), unless the authorization is terminated or revoked.  Performed at [facility], [address] [place], [place] [address]   Culture, blood (routine x 2)      Status: None (Preliminary result)   Collection Time: 09/10/20 11:24 PM   Specimen: BLOOD  Result Value Ref Range Status   Specimen Description BLOOD BLOOD LEFT WRIST  Final   Special Requests   Final    BOTTLES DRAWN AEROBIC AND ANAEROBIC Blood Culture results may not be optimal due to an inadequate volume of blood received in culture bottles   Culture   Final    NO GROWTH < 12 HOURS Performed at [facility], [address] [place], [place] [address]    Report Status PENDING  Incomplete      Studies: CT HEAD WO CONTRAST  Result Date: 09/10/2020 CLINICAL DATA:  Fall and hyperglycemia EXAM: CT HEAD WITHOUT CONTRAST TECHNIQUE: Contiguous axial images were obtained from the base of the skull through the vertex without intravenous contrast. COMPARISON:  09/08/2020 FINDINGS: Brain: There is no mass, hemorrhage or extra-axial  collection. There is generalized atrophy without lobar predilection. Hypodensity of the white matter is most commonly associated with chronic microvascular disease. Vascular: No abnormal hyperdensity of the major intracranial arteries or dural venous sinuses. No intracranial atherosclerosis. Skull: The visualized skull base, calvarium and extracranial soft tissues are normal. Sinuses/Orbits: No fluid levels or advanced mucosal thickening of the visualized paranasal sinuses. No mastoid or middle ear effusion. The orbits are normal. IMPRESSION: Generalized atrophy and chronic microvascular ischemia without acute intracranial abnormality. Electronically Signed   By: Deatra Robinson M.D.   On: 09/10/2020 23:45   MR BRAIN WO CONTRAST  Result Date: 09/11/2020 CLINICAL DATA:  87 year old female with right extremity weakness. Generalized weakness and recurrent falls. EXAM: MRI HEAD WITHOUT CONTRAST TECHNIQUE: Multiplanar, multiecho pulse sequences of the brain and surrounding structures were obtained without intravenous contrast. COMPARISON:  Brain MRI 09/03/2019.  Head CT  09/10/2020. FINDINGS: Brain: Small 4-5 mm focus of restricted diffusion in the left central pons on series 5, image 12 (series 7, image 20 and series 8, image 20). Faint T2 and FLAIR hyperintensity. No hemorrhage or mass effect. No other restricted diffusion. No midline shift, mass effect, evidence of mass lesion, ventriculomegaly, extra-axial collection or acute intracranial hemorrhage. Cervicomedullary junction and pituitary are within normal limits. 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 Patchy and confluent T2 and FLAIR heterogeneity in the bilateral cerebral white matter and deep gray matter nuclei is stable. Small focus of cystic encephalomalacia in the right centrum semiovale is stable compatible with chronic white matter lacune. No chronic cerebral blood products or cortical encephalomalacia identified. Vascular: Major intracranial vascular flow voids are stable since last year. Skull and upper cervical spine: Negative for age. Sinuses/Orbits: Stable and negative. Other: Mastoids remain clear. Visible internal auditory structures appear normal. Negative visible scalp and face. IMPRESSION: 1. Small acute infarct in the anterior left paracentral pons, basilar perforator artery territory. No associated hemorrhage or mass effect. 2. Otherwise stable MRI appearance of chronic small vessel disease since last year. Electronically Signed   By: Odessa Fleming M.D.   On: 09/11/2020 07:51   US Carotid Bilateral (at [facility] and [facility] only)  Result Date: 09/11/2020 CLINICAL DATA:  87 year old female with recent neurologic deficit. EXAM: BILATERAL CAROTID DUPLEX ULTRASOUND TECHNIQUE: Wallace Cullens scale imaging, color Doppler and duplex ultrasound were performed of bilateral carotid and vertebral arteries in the neck. COMPARISON:  Head CT 09/10/2020. FINDINGS: Criteria: Quantification of carotid stenosis is based on velocity parameters that correlate the residual internal carotid diameter with NASCET-based stenosis  levels, using the diameter of the distal internal carotid lumen as the denominator for stenosis measurement. The following velocity measurements were obtained: RIGHT ICA: 87 cm/sec CCA: 56 cm/sec SYSTOLIC ICA/CCA RATIO:  1.5 ECA: 73 cm/sec LEFT ICA: 109 cm/sec CCA: 72 cm/sec SYSTOLIC ICA/CCA RATIO:  1.5 ECA: 126 cm/sec RIGHT CAROTID ARTERY: Atherosclerosis at the right carotid bifurcation and right ICA origin, proximal bulb. RIGHT VERTEBRAL ARTERY:  Patent, antegrade flow. LEFT CAROTID ARTERY: Atherosclerosis visible at the left carotid bifurcation and proximal bulb. LEFT VERTEBRAL ARTERY:  Patent, antegrade flow. IMPRESSION: Bilateral carotid bifurcation atherosclerosis but Doppler criteria indicate less than 50% stenosis. Electronically Signed   By: Odessa Fleming M.D.   On: 09/11/2020 06:35   DG Chest Port 1 View  Result Date: 09/10/2020 CLINICAL DATA:  Weakness EXAM: PORTABLE CHEST 1 VIEW COMPARISON:  06/26/2017 FINDINGS: Bibasilar opacities, likely atelectasis. Heart is borderline in size. No effusions or acute bony abnormality. IMPRESSION: Bibasilar opacities, favor atelectasis. Electronically  Signed   By: Charlett Nose M.D.   On: 09/10/2020 23:27   DG Humerus Right  Result Date: 09/11/2020 CLINICAL DATA:  Bruising/deformity of right upper extremity. Status post fall. Right arm weakness. EXAM: RIGHT HUMERUS - 2+ VIEW COMPARISON:  None. FINDINGS: Displaced fracture of the greater tuberosity and humeral neck. Alignment of the glenohumeral joint not well visualized. Associated overlying subcutaneus soft tissue edema. IMPRESSION: 1. Poorly visualized displaced fracture of the greater tuberosity and humeral neck. 2. Alignment of the glenohumeral joint not well visualized. Electronically Signed   By: Tish Frederickson M.D.   On: 09/11/2020 02:46    Scheduled Meds: .  stroke: mapping our early stages of recovery book   Does not apply Once  . aspirin  81 mg Oral Daily  . B-12   Oral Q1200  . calcium-vitamin D  1  tablet Oral BID  . clopidogrel  75 mg Oral Daily  . enoxaparin (LOVENOX) injection  40 mg Subcutaneous Q24H  . guaiFENesin  600 mg Oral BID  . rosuvastatin  20 mg Oral Daily  . sodium chloride flush  3 mL Intravenous Once    Continuous Infusions: . sodium chloride Stopped (09/11/20 0152)  . sodium chloride 100 mL/hr at 09/11/20 0151  . azithromycin Stopped (09/11/20 0142)  . cefTRIAXone (ROCEPHIN)  IV Stopped (09/11/20 0242)  . potassium chloride       LOS: 0 days     Myrtie Neither, MD [facility]  To reach me or the doctor on call, go to: [email or web address] Password [email or web address]  09/11/2020, 1:50 PM

## 2020-09-11 NOTE — H&P (Addendum)
[facility]   PATIENT NAME: Elizabeth [name]    MR#:  [id number]  DATE OF BIRTH:  Sep 22, 1927  DATE OF ADMISSION:  09/10/2020  PRIMARY CARE PHYSICIAN: Barbette Reichmann, MD   Patient is coming from: Home  REQUESTING/REFERRING PHYSICIAN: Chiquita Loth, MD  CHIEF COMPLAINT:   Chief Complaint  Patient presents with  . Hyperglycemia  . Fall    HISTORY OF PRESENT ILLNESS:  Elizabeth [name] is a 87 y.o. Caucasian female with medical history significant for Alzheimer's dementia,  dyslipidemia and coronary artery disease who presented to the emergency room with acute onset of generalized weakness and recurrent falls as well as right upper extremity weakness with bruising and swelling which have been going on over the last 48 hours.  She has been fairly confused.  She was noted to have right upper and lower extremity weakness as well.  No dysphagia or dysarthria.  She denied any paresthesias.  No facial droop.  No tinnitus or vertigo.  She denies any nausea or vomiting or diarrhea or abdominal pain.  She was noted to have congested cough without significant dyspnea or wheezing.  No fever or chills.  She denies any chest pain or palpitations.  Pulse oximetry was down to 88% on room air and was up to 95% on 2 L by nasal cannula.  She was seen in the ED on 6/1 for fall with minor head injury and discharged after negative head and C-spine CT scans..  ED Course: Upon presentation to the emergency room, respiratory rate was 22 with otherwise normal vital signs and pulse oximetry was 90% on room air and 95% on 2 L of O2 by nasal cannula.  Labs revealed mild hypokalemia 3.4 and hyperglycemia of 224.  BNP was 747.3 and high-sensitivity troponin I was 224 CBC showed leukocytosis of 12.4.  Influenza antigens and COVID-19 PCR came back negative and UA was unremarkable.  Blood and urine cultures were drawn. EKG as reviewed by me : Showed normal sinus rhythm with a rate of 82 with PACs and T wave inversion  anteroseptally Imaging: Chest x-ray showed bibasal opacities.  The patient was given 1 L bolus of IV normal saline.  She will be admitted to a medically monitored bed for further evaluation and management. PAST MEDICAL HISTORY:   Past Medical History:  Diagnosis Date  . CAD (coronary artery disease)    a.  Inferolateral STEMI 09/02/2019  . Dementia (HCC)   . History of CVA (cerebrovascular accident)   . Hyperlipidemia LDL goal <70     PAST SURGICAL HISTORY:   Past Surgical History:  Procedure Laterality Date  . CORONARY/GRAFT ACUTE MI REVASCULARIZATION N/A 09/02/2019   Procedure: Coronary/Graft Acute MI Revascularization;  Surgeon: Yvonne Kendall, MD;  Location: [facility] INVASIVE CV LAB;  Service: Cardiovascular;  Laterality: N/A;  . LEFT HEART CATH AND CORONARY ANGIOGRAPHY N/A 09/02/2019   Procedure: LEFT HEART CATH AND CORONARY ANGIOGRAPHY;  Surgeon: Yvonne Kendall, MD;  Location: [facility] INVASIVE CV LAB;  Service: Cardiovascular;  Laterality: N/A;    SOCIAL HISTORY:   Social History   Tobacco Use  . Smoking status: Never Smoker  . Smokeless tobacco: Never Used  Substance Use Topics  . Alcohol use: Never    FAMILY HISTORY:  No family history on file.  She did not any familial diseases. DRUG ALLERGIES:  No Known Allergies  REVIEW OF SYSTEMS:   ROS As per history of present illness. All pertinent systems were reviewed above. Constitutional, HEENT, cardiovascular,  respiratory, GI, GU, musculoskeletal, neuro, psychiatric, endocrine, integumentary and hematologic systems were reviewed and are otherwise negative/unremarkable except for positive findings mentioned above in the HPI.   MEDICATIONS AT HOME:   Prior to Admission medications   Medication Sig Start Date End Date Taking? Authorizing Provider  aspirin 81 MG chewable tablet Chew 1 tablet (81 mg total) by mouth daily. 09/17/19   Tresa Moore, MD  clopidogrel (PLAVIX) 75 MG tablet Take 1 tablet PO QD 09/08/20   End,  Cristal Deer, MD  Cyanocobalamin (B-12 PO) Take by mouth daily at 12 noon.    [provider]  nitroGLYCERIN (NITROSTAT) 0.4 MG SL tablet Place 1 tablet (0.4 mg total) under the tongue every 5 (five) minutes as needed for chest pain. 01/05/20   Dunn, Raymon Mutton, PA-C  rosuvastatin (CRESTOR) 20 MG tablet Take 1 tablet (20 mg total) by mouth daily. 09/08/20 12/07/20  End, Cristal Deer, MD      VITAL SIGNS:  Blood pressure 135/75, pulse 73, temperature 97.6 F (36.4 C), temperature source Oral, resp. rate 16, height 5\' 2"  (1.575 m), weight 53.4 kg, SpO2 95 %.  PHYSICAL EXAMINATION:  Physical Exam  GENERAL:  87 y.o.-year-old Caucasian female patient lying in the bed with no acute distress.  EYES: Pupils equal, round, reactive to light and accommodation. No scleral icterus. Extraocular muscles intact.  HEENT: Head atraumatic, normocephalic. Oropharynx and nasopharynx clear.  NECK:  Supple, no jugular venous distention. No thyroid enlargement, no tenderness.  LUNGS: Diminished bibasal breath sounds with bibasal crackles. CARDIOVASCULAR: Regular rate and rhythm, S1, S2 normal. No murmurs, rubs, or gallops.  ABDOMEN: Soft, nondistended, nontender. Bowel sounds present. No organomegaly or mass.  EXTREMITIES: No pedal edema, cyanosis, or clubbing.  She has a right arm swelling and contusion with bluish discoloration. NEUROLOGIC: Cranial nerves II through XII are intact. Muscle strength 3/5 on the right upper and lower extremity compared to 5/5 in the left upper and lower extremity. Sensation intact. Gait not checked.  PSYCHIATRIC: The patient is alert and oriented x 3.  Normal affect and good eye contact. SKIN: No obvious rash, lesion, or ulcer.   LABORATORY PANEL:   CBC Recent Labs  Lab 09/10/20 2306  WBC 12.4*  HGB 12.3  HCT 36.4  PLT 198   ------------------------------------------------------------------------------------------------------------------  Chemistries  Recent Labs  Lab  09/10/20 2306  NA 136  K 3.4*  CL 100  CO2 24  GLUCOSE 224*  BUN 30*  CREATININE 0.61  CALCIUM 8.7*  AST 37  ALT 25  ALKPHOS 62  BILITOT 2.5*   ------------------------------------------------------------------------------------------------------------------  Cardiac Enzymes No results for input(s): TROPONINI in the last 168 hours. ------------------------------------------------------------------------------------------------------------------  RADIOLOGY:  CT HEAD WO CONTRAST  Result Date: 09/10/2020 CLINICAL DATA:  Fall and hyperglycemia EXAM: CT HEAD WITHOUT CONTRAST TECHNIQUE: Contiguous axial images were obtained from the base of the skull through the vertex without intravenous contrast. COMPARISON:  09/08/2020 FINDINGS: Brain: There is no mass, hemorrhage or extra-axial collection. There is generalized atrophy without lobar predilection. Hypodensity of the white matter is most commonly associated with chronic microvascular disease. Vascular: No abnormal hyperdensity of the major intracranial arteries or dural venous sinuses. No intracranial atherosclerosis. Skull: The visualized skull base, calvarium and extracranial soft tissues are normal. Sinuses/Orbits: No fluid levels or advanced mucosal thickening of the visualized paranasal sinuses. No mastoid or middle ear effusion. The orbits are normal. IMPRESSION: Generalized atrophy and chronic microvascular ischemia without acute intracranial abnormality. Electronically Signed   By: 11/08/2020.D.  On: 09/10/2020 23:45   DG Chest Port 1 View  Result Date: 09/10/2020 CLINICAL DATA:  Weakness EXAM: PORTABLE CHEST 1 VIEW COMPARISON:  06/26/2017 FINDINGS: Bibasilar opacities, likely atelectasis. Heart is borderline in size. No effusions or acute bony abnormality. IMPRESSION: Bibasilar opacities, favor atelectasis. Electronically Signed   By: Charlett Nose M.D.   On: 09/10/2020 23:27      IMPRESSION AND PLAN:  Active Problems:   CAP  (community acquired pneumonia)  1.  Community acquired bibasal pneumonia with subsequent sepsis and acute hypoxic respiratory failure.  The patient has leukocytosis and tachypnea.  She meets severe sepsis criteria given elevated lactic acid of 3.4 and hypoxemia. - The patient will be admitted to a medical monitored bed. - We will continue antibiotic therapy with IV Rocephin and Zithromax. - We will follow blood and sputum cultures. - Mucolytic therapy will be provided. - We will follow lactic acid.  2.  Right sided hemiparesis, rule out evolving CVA. - We will obtain a brain MRI without contrast. - We will further assess with bilateral carotid Doppler and 2D echo with bubble study. - The patient will be continued on aspirin and Plavix and I will continue statin therapy and check fasting lipids. -PT/OT and ST consults will be obtained. - Neurology consult to be obtained. - I notified Dr. Wilford Corner about the patient.  3.  Elevated troponin I. - All this could be related to sepsis, will obtain serial troponin I's.  She is chest pain-free.  4.  Recurrent falls with subsequent right upper extremity contusion. - We will obtain an x-ray of the right upper extremity to rule out humerus fracture. - Physical therapy consult will be obtained to assist with ambulation.  5.  Coronary artery disease. - The patient will be continued on aspirin and Plavix as well as statin therapy and as needed sublingual nitroglycerin.  DVT prophylaxis: Lovenox. Code Status: full code. Family Communication:  The plan of care was discussed in details with the patient (and family). I answered all questions. The patient agreed to proceed with the above mentioned plan. Further management will depend upon hospital course. Disposition Plan: Back to previous home environment Consults called: Neurology All the records are reviewed and case discussed with ED provider.  Status is: Inpatient  Remains inpatient appropriate  because:Ongoing diagnostic testing needed not appropriate for outpatient work up, Unsafe d/c plan, IV treatments appropriate due to intensity of illness or inability to take PO and Inpatient level of care appropriate due to severity of illness   Dispo: The patient is from: Home              Anticipated d/c is to: Home              Patient currently is not medically stable to d/c.   Difficult to place patient No  TOTAL TIME TAKING CARE OF THIS PATIENT: 55 minutes.    Hannah Beat M.D on 09/11/2020 at 1:22 AM  [facility]   From 7 PM-7 AM, contact night-coverage [email or web address]  CC: Primary care physician; Barbette Reichmann, MD

## 2020-09-11 NOTE — Progress Notes (Signed)
PT Cancellation Note  Patient Details Name: Elizabeth [name] MRN: [id number] DOB: [birth year]   Cancelled Treatment:    Reason Eval/Treat Not Completed: Patient not medically ready PT orders received, chart reviewed. Pt noted to have critically low calcium (6.0) & PT contraindicated at this time. Per OT note, no current plan for lab re-draws but nurse to f/u. Will f/u with pt as able & as pt is medically appropriate to participate.   Aleda Grana, PT, DPT 09/11/20, 10:33 AM   Sandi Mariscal 09/11/2020, 10:32 AM

## 2020-09-11 NOTE — Progress Notes (Signed)
  PHARMACY - PHYSICIAN COMMUNICATION CRITICAL VALUE ALERT - BLOOD CULTURE IDENTIFICATION (BCID)  Assessment: 87 year old Caucasian female who was admitted 09/10/20 with acute onset generalized weakness recurrent fall as well as right upper extremity weakness. Started on ceftriaxone and azithromycin for suspected CAP  09/10/20 Blood culture: 1/4 (aerobic): Methicillin-resistant Staphylococcus epidermidis    Name of physician (or Provider) Contacted: Audrea Muscat, NP  Changes to prescribed antibiotics required: If suspect clinical infection, recommend Vancomycin as first line therapy. If not, continue current therapy for CAP  Sharen Hones, PharmD, BCPS Clinical Pharmacist   09/11/2020  7:44 PM

## 2020-09-11 NOTE — Progress Notes (Signed)
OT Cancellation Note  Patient Details Name: Elizabeth [name] MRN: [id number] DOB: 1927/06/22   Cancelled Treatment:    Reason Eval/Treat Not Completed: Medical issues which prohibited therapy  OT consult received and chart reviewed. Upon chart review this AM, noted that pt's Calcium level is at 6.0 which is outside of guidelines for therapy participation. Upon speaking with RN, no current orders for lab re-draw at this time, but she will follow up. OT will f/u for evaluation at later date/time as appropriate/able. Thank you.  Rejeana Brock, MS, OTR/L ascom [phone] 09/11/20, 10:22 AM

## 2020-09-12 ENCOUNTER — Inpatient Hospital Stay: Payer: Medicare HMO

## 2020-09-12 ENCOUNTER — Inpatient Hospital Stay (HOSPITAL_COMMUNITY)
Admit: 2020-09-12 | Discharge: 2020-09-12 | Disposition: A | Discharge status: Home / Self Care | Payer: Medicare HMO | Attending: Family Medicine | Admitting: Family Medicine

## 2020-09-12 DIAGNOSIS — I6389 Other cerebral infarction: Secondary | ICD-10-CM

## 2020-09-12 LAB — URINE CULTURE

## 2020-09-12 LAB — BASIC METABOLIC PANEL
Anion gap: 9 (ref 5–15)
BUN: 13 mg/dL (ref 8–23)
CO2: 23 mmol/L (ref 22–32)
Calcium: 7.8 mg/dL — ABNORMAL LOW (ref 8.9–10.3)
Chloride: 104 mmol/L (ref 98–111)
Creatinine, Ser: 0.47 mg/dL (ref 0.44–1.00)
GFR, Estimated: >60 mL/min (ref >60)
Glucose, Bld: 163 mg/dL — ABNORMAL HIGH (ref 70–99)
Potassium: 2.9 mmol/L — ABNORMAL LOW (ref 3.5–5.1)
Sodium: 136 mmol/L (ref 135–145)

## 2020-09-12 LAB — ECHOCARDIOGRAM COMPLETE BUBBLE STUDY
AR max vel: 1.6 cm2
AV Peak grad: 10.8 mmHg
Ao pk vel: 1.64 m/s
Area-P 1/2: 3.33 cm2
S' Lateral: 2.43 cm

## 2020-09-12 IMAGING — CR DG SHOULDER 2+V*R*
1 series · 3 of 3 positions shown · non-contrast
Comparison: [DATE].

CLINICAL DATA: Known right humeral head fracture.

EXAM:
RIGHT SHOULDER - 2+ VIEW

[Series 1: dg shoulder right · 0.14mm/px · 3 of 3 slices shown]
[im 1/3]
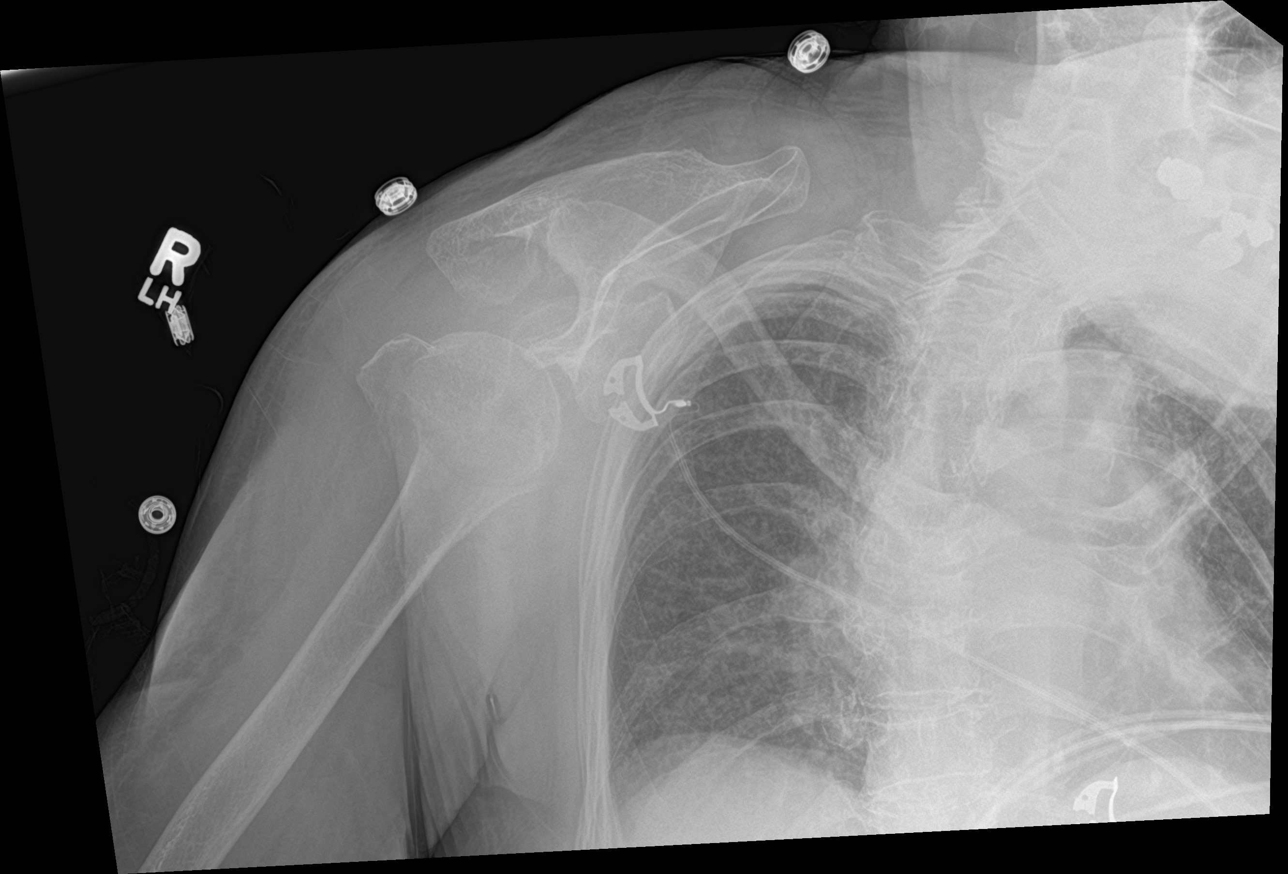
[im 2/3]
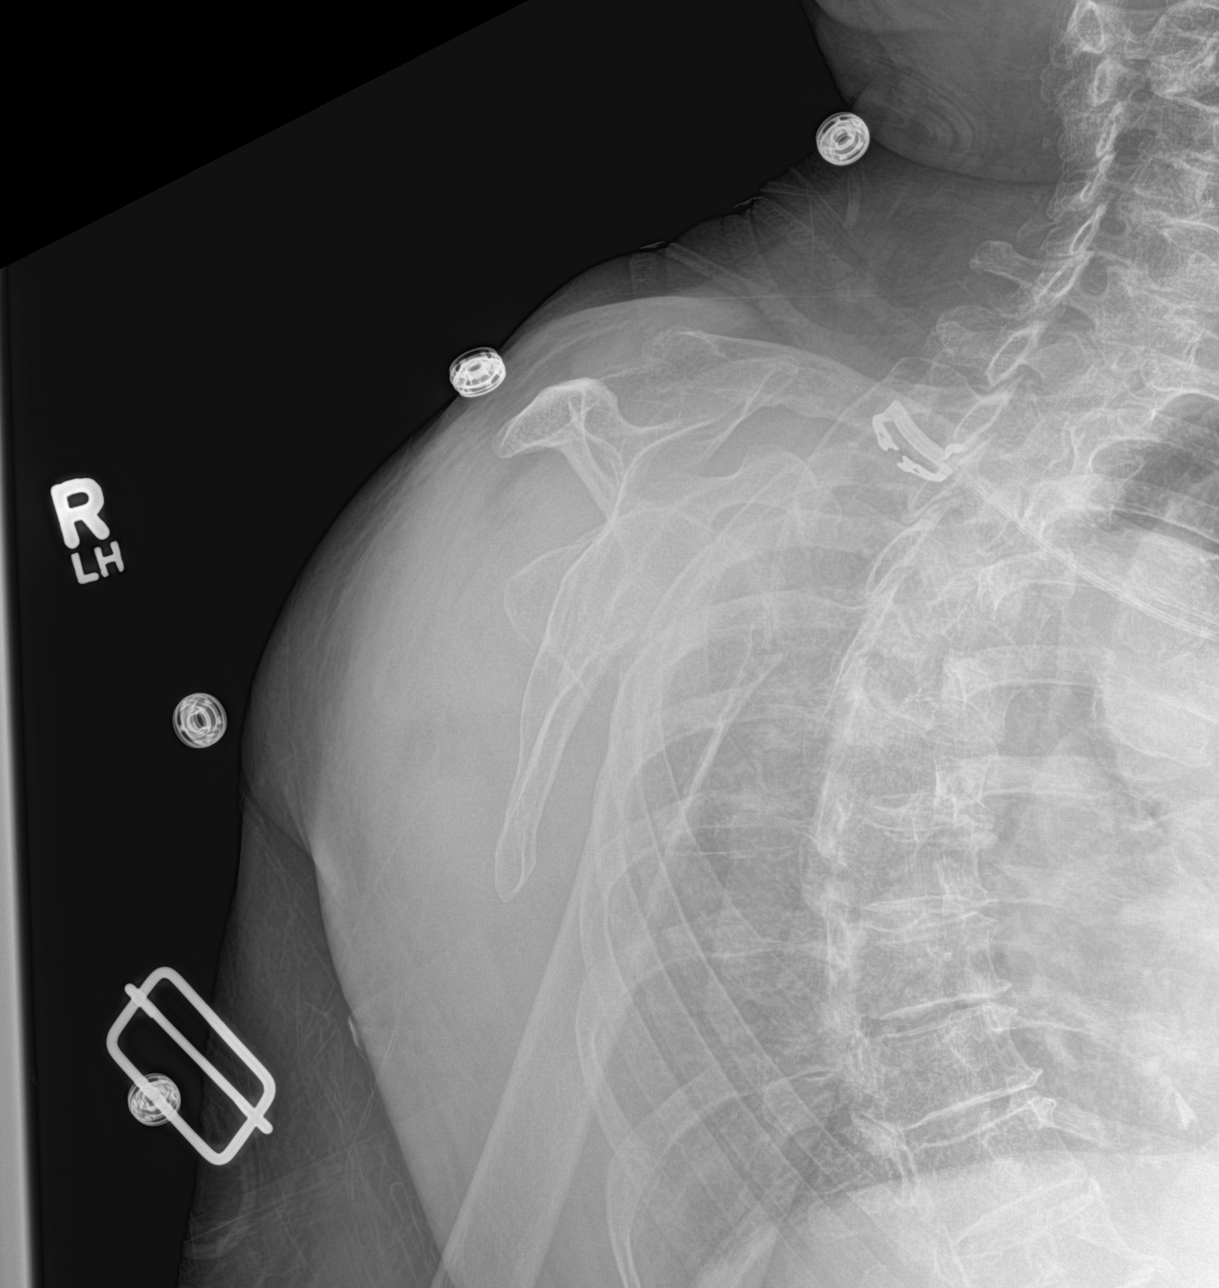
[im 3/3]
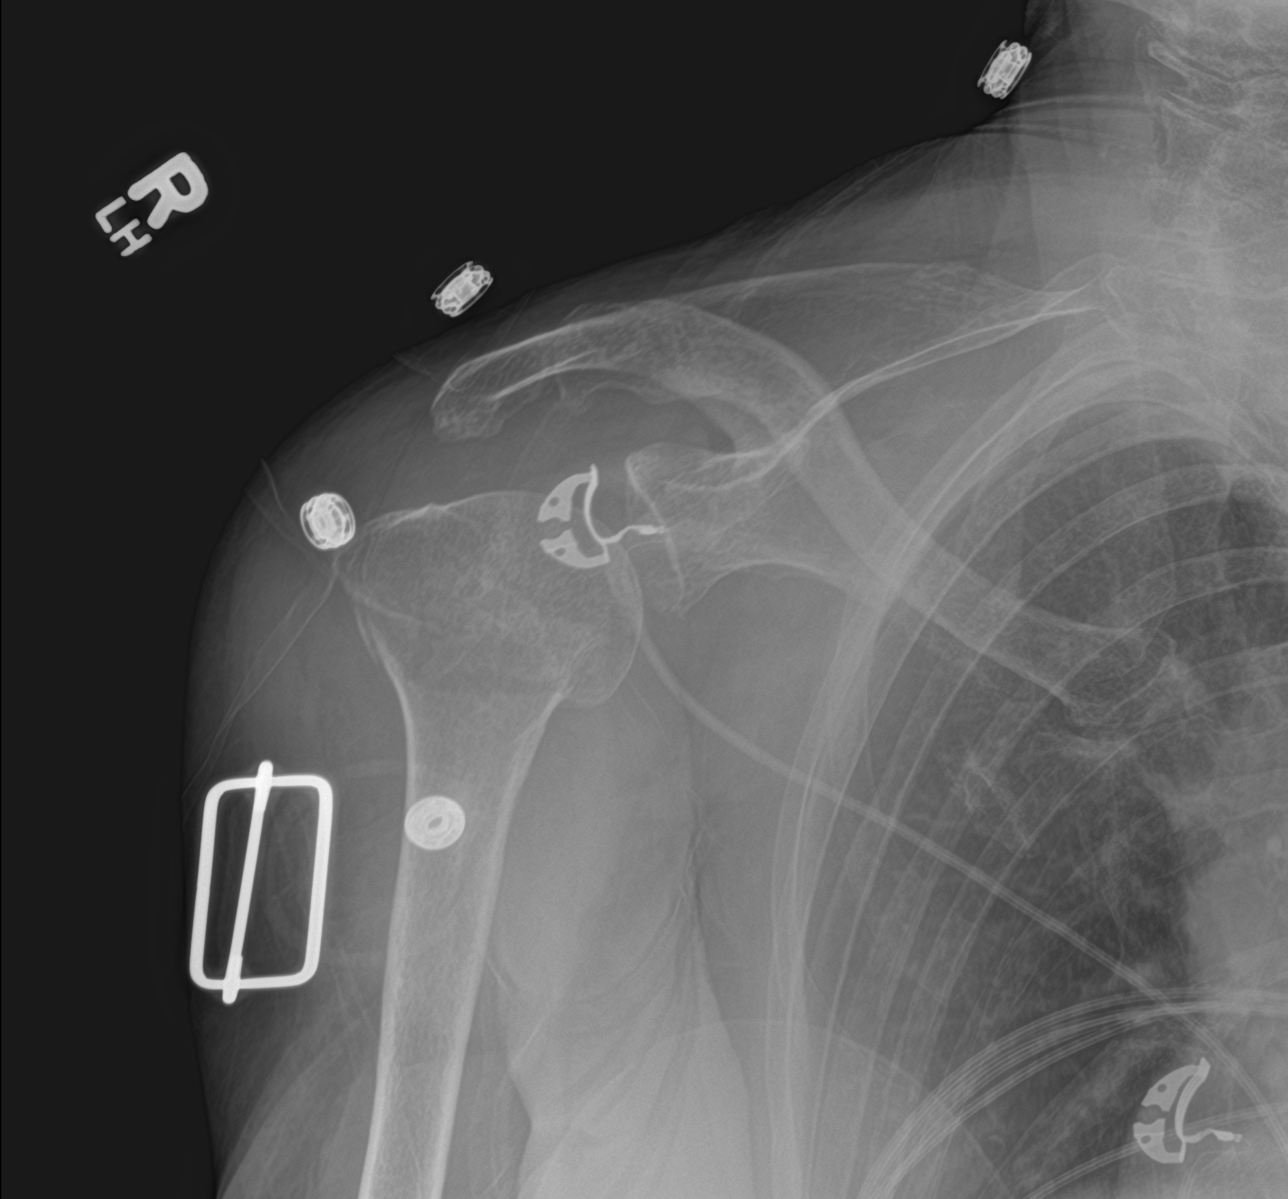

[3 of 3 positions shown; findings below may reference images not displayed]

FINDINGS: Unchanged alignment of the displaced fracture of the greater
tuberosity and humeral neck. Inferior displacement of the humeral
head in relation to the glenoid, likely sequela of a joint effusion.
Visualized lung fields are clear.
IMPRESSION: Unchanged alignment of the displaced fracture of the greater
tuberosity and humeral neck.

## 2020-09-12 MED ORDER — MORPHINE SULFATE (PF) 2 MG/ML IV SOLN
2.0000 mg | INTRAVENOUS | Status: DC | PRN
  Administered 2020-09-12 – 2020-09-13 (×2): 2 mg via INTRAVENOUS
  Filled 2020-09-12 (×2): qty 1

## 2020-09-12 NOTE — Plan of Care (Signed)
Message provider this am. Pt screamed with cleaning. Questioned adding fasting acting pain meds Problem: Activity: Goal: Ability to tolerate increased activity will improve Outcome: Progressing   Problem: Clinical Measurements: Goal: Ability to maintain a body temperature in the normal range will improve Outcome: Progressing   Problem: Respiratory: Goal: Ability to maintain adequate ventilation will improve Outcome: Progressing Goal: Ability to maintain a clear airway will improve Outcome: Progressing   Problem: Education: Goal: Knowledge of General Education information will improve Description: Including pain rating scale, medication(s)/side effects and non-pharmacologic comfort measures Outcome: Progressing   Problem: Health Behavior/Discharge Planning: Goal: Ability to manage health-related needs will improve Outcome: Progressing   Problem: Clinical Measurements: Goal: Ability to maintain clinical measurements within normal limits will improve Outcome: Progressing Goal: Will remain free from infection Outcome: Progressing Goal: Diagnostic test results will improve Outcome: Progressing Goal: Respiratory complications will improve Outcome: Progressing Goal: Cardiovascular complication will be avoided Outcome: Progressing   Problem: Activity: Goal: Risk for activity intolerance will decrease Outcome: Progressing   Problem: Nutrition: Goal: Adequate nutrition will be maintained Outcome: Progressing   Problem: Coping: Goal: Level of anxiety will decrease Outcome: Progressing   Problem: Elimination: Goal: Will not experience complications related to bowel motility Outcome: Progressing Goal: Will not experience complications related to urinary retention Outcome: Progressing   Problem: Safety: Goal: Ability to remain free from injury will improve Outcome: Progressing   Problem: Skin Integrity: Goal: Risk for impaired skin integrity will decrease Outcome:  Progressing   Problem: Pain Managment: Goal: General experience of comfort will improve Outcome: Not Progressing

## 2020-09-12 NOTE — Consult Note (Signed)
ORTHOPAEDIC CONSULTATION  REQUESTING PHYSICIAN: Myrtie Neither, MD  Chief Complaint: Right shoulder pain  HPI: Elizabeth [name] is a 87 y.o. female who complains of right shoulder pain.  The patient has been weak and falling at home last few days and presented to the emergency room Friday night 09/10/2020.  She was found to have multiple medical problems plus a minimally displaced fracture of the right proximal humerus surgical neck and greater tuberosity.  He has been admitted for medical treatment.  A sling was applied to the right arm.  Past Medical History:  Diagnosis Date  . CAD (coronary artery disease)    a.  Inferolateral STEMI 09/02/2019  . Dementia (HCC)   . History of CVA (cerebrovascular accident)   . Hyperlipidemia LDL goal <70    Past Surgical History:  Procedure Laterality Date  . CORONARY/GRAFT ACUTE MI REVASCULARIZATION N/A 09/02/2019   Procedure: Coronary/Graft Acute MI Revascularization;  Surgeon: Yvonne Kendall, MD;  Location: [facility] INVASIVE [facility];  Service: Cardiovascular;  Laterality: N/A;  . LEFT HEART CATH AND CORONARY ANGIOGRAPHY N/A 09/02/2019   Procedure: LEFT HEART CATH AND CORONARY ANGIOGRAPHY;  Surgeon: Yvonne Kendall, MD;  Location: [facility] INVASIVE CV [facility];  Service: Cardiovascular;  Laterality: N/A;   Social History   Socioeconomic History  . Marital status: Widowed    Spouse name: Not on file  . Number of children: Not on file  . Years of education: Not on file  . Highest education level: Not on file  Occupational History  . Not on file  Tobacco Use  . Smoking status: Never Smoker  . Smokeless tobacco: Never Used  Substance and Sexual Activity  . Alcohol use: Never  . Drug use: Not on file  . Sexual activity: Not on file  Other Topics Concern  . Not on file  Social History Narrative  . Not on file   Social Determinants of Health   Financial Resource Strain: Not on file  Food Insecurity: Not on file  Transportation Needs: Not on  file  Physical Activity: Not on file  Stress: Not on file  Social Connections: Not on file   No family history on file. No Known Allergies Prior to Admission medications   Medication Sig Start Date End Date Taking? Authorizing Provider  aspirin 81 MG chewable tablet Chew 1 tablet (81 mg total) by mouth daily. 09/17/19  Yes Sreenath, Sudheer B, MD  clopidogrel (PLAVIX) 75 MG tablet Take 1 tablet PO QD 09/08/20  Yes End, Cristal Deer, MD  Cyanocobalamin (B-12 PO) Take by mouth daily at 12 noon.   Yes [provider]  nitroGLYCERIN (NITROSTAT) 0.4 MG SL tablet Place 1 tablet (0.4 mg total) under the tongue every 5 (five) minutes as needed for chest pain. 01/05/20  Yes Dunn, Raymon Mutton, PA-C  rosuvastatin (CRESTOR) 20 MG tablet Take 1 tablet (20 mg total) by mouth daily. 09/08/20 12/07/20 Yes End, Cristal Deer, MD   DG Shoulder Right  Result Date: 09/12/2020 CLINICAL DATA:  Known right humeral head fracture. EXAM: RIGHT SHOULDER - 2+ VIEW COMPARISON:  September 11, 2020. FINDINGS: Unchanged alignment of the displaced fracture of the greater tuberosity and humeral neck. Inferior displacement of the humeral head in relation to the glenoid, likely sequela of a joint effusion. Visualized lung fields are clear. IMPRESSION: Unchanged alignment of the displaced fracture of the greater tuberosity and humeral neck. Electronically Signed   By: Maudry Mayhew MD   On: 09/12/2020 11:40   CT HEAD WO CONTRAST  Result Date:  09/10/2020 CLINICAL DATA:  Fall and hyperglycemia EXAM: CT HEAD WITHOUT CONTRAST TECHNIQUE: Contiguous axial images were obtained from the base of the skull through the vertex without intravenous contrast. COMPARISON:  09/08/2020 FINDINGS: Brain: There is no mass, hemorrhage or extra-axial collection. There is generalized atrophy without lobar predilection. Hypodensity of the white matter is most commonly associated with chronic microvascular disease. Vascular: No abnormal hyperdensity of the major  intracranial arteries or dural venous sinuses. No intracranial atherosclerosis. Skull: The visualized skull base, calvarium and extracranial soft tissues are normal. Sinuses/Orbits: No fluid levels or advanced mucosal thickening of the visualized paranasal sinuses. No mastoid or middle ear effusion. The orbits are normal. IMPRESSION: Generalized atrophy and chronic microvascular ischemia without acute intracranial abnormality. Electronically Signed   By: Deatra Robinson M.D.   On: 09/10/2020 23:45   MR ANGIO HEAD WO CONTRAST  Result Date: 09/11/2020 CLINICAL DATA:  Right extremity weakness with recurrent falls. Acute infarction left pons EXAM: MRA HEAD WITHOUT CONTRAST TECHNIQUE: Angiographic images of the Circle of Willis were acquired using MRA technique without intravenous contrast. COMPARISON:  MRI same day. FINDINGS: Anterior circulation: Both internal carotid arteries widely patent through the skull base and siphon regions. Anterior and middle cerebral vessels show flow. Moderate atherosclerotic irregularity of the more distal branch vessels. Posterior circulation: Both vertebral arteries widely patent to the basilar. Artifactual signal loss in both distal vertebral artery V4 segments. No basilar stenosis. Posterior circulation branch vessels show flow. Flow present in both superior cerebellar and posterior cerebral arteries. There is severe stenotic disease within both P1 P2 junction regions. Anatomic variants: None significant Other: None. IMPRESSION: Both vertebral arteries appear widely patent to the basilar. No basilar stenosis. Severe stenotic disease within the posterior cerebral arteries, particularly at the P1 P2 junction regions. Electronically Signed   By: Paulina Fusi M.D.   On: 09/11/2020 13:56   MR BRAIN WO CONTRAST  Result Date: 09/11/2020 CLINICAL DATA:  87 year old female with right extremity weakness. Generalized weakness and recurrent falls. EXAM: MRI HEAD WITHOUT CONTRAST TECHNIQUE:  Multiplanar, multiecho pulse sequences of the brain and surrounding structures were obtained without intravenous contrast. COMPARISON:  Brain MRI 09/03/2019.  Head CT 09/10/2020. FINDINGS: Brain: Small 4-5 mm focus of restricted diffusion in the left central pons on series 5, image 12 (series 7, image 20 and series 8, image 20). Faint T2 and FLAIR hyperintensity. No hemorrhage or mass effect. No other restricted diffusion. No midline shift, mass effect, evidence of mass lesion, ventriculomegaly, extra-axial collection or acute intracranial hemorrhage. Cervicomedullary junction and pituitary are within normal limits. 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 Patchy and confluent T2 and FLAIR heterogeneity in the bilateral cerebral white matter and deep gray matter nuclei is stable. Small focus of cystic encephalomalacia in the right centrum semiovale is stable compatible with chronic white matter lacune. No chronic cerebral blood products or cortical encephalomalacia identified. Vascular: Major intracranial vascular flow voids are stable since last year. Skull and upper cervical spine: Negative for age. Sinuses/Orbits: Stable and negative. Other: Mastoids remain clear. Visible internal auditory structures appear normal. Negative visible scalp and face. IMPRESSION: 1. Small acute infarct in the anterior left paracentral pons, basilar perforator artery territory. No associated hemorrhage or mass effect. 2. Otherwise stable MRI appearance of chronic small vessel disease since last year. Electronically Signed   By: Odessa Fleming M.D.   On: 09/11/2020 07:51   US Carotid Bilateral (at [facility] and [facility] only)  Result Date: 09/11/2020 CLINICAL DATA:  87 year old  female with recent neurologic deficit. EXAM: BILATERAL CAROTID DUPLEX ULTRASOUND TECHNIQUE: Wallace Cullens scale imaging, color Doppler and duplex ultrasound were performed of bilateral carotid and vertebral arteries in the neck. COMPARISON:  Head CT 09/10/2020.  FINDINGS: Criteria: Quantification of carotid stenosis is based on velocity parameters that correlate the residual internal carotid diameter with NASCET-based stenosis levels, using the diameter of the distal internal carotid lumen as the denominator for stenosis measurement. The following velocity measurements were obtained: RIGHT ICA: 87 cm/sec CCA: 56 cm/sec SYSTOLIC ICA/CCA RATIO:  1.5 ECA: 73 cm/sec LEFT ICA: 109 cm/sec CCA: 72 cm/sec SYSTOLIC ICA/CCA RATIO:  1.5 ECA: 126 cm/sec RIGHT CAROTID ARTERY: Atherosclerosis at the right carotid bifurcation and right ICA origin, proximal bulb. RIGHT VERTEBRAL ARTERY:  Patent, antegrade flow. LEFT CAROTID ARTERY: Atherosclerosis visible at the left carotid bifurcation and proximal bulb. LEFT VERTEBRAL ARTERY:  Patent, antegrade flow. IMPRESSION: Bilateral carotid bifurcation atherosclerosis but Doppler criteria indicate less than 50% stenosis. Electronically Signed   By: Odessa Fleming M.D.   On: 09/11/2020 06:35   DG Chest Port 1 View  Result Date: 09/10/2020 CLINICAL DATA:  Weakness EXAM: PORTABLE CHEST 1 VIEW COMPARISON:  06/26/2017 FINDINGS: Bibasilar opacities, likely atelectasis. Heart is borderline in size. No effusions or acute bony abnormality. IMPRESSION: Bibasilar opacities, favor atelectasis. Electronically Signed   By: Charlett Nose M.D.   On: 09/10/2020 23:27   DG Humerus Right  Result Date: 09/11/2020 CLINICAL DATA:  Bruising/deformity of right upper extremity. Status post fall. Right arm weakness. EXAM: RIGHT HUMERUS - 2+ VIEW COMPARISON:  None. FINDINGS: Displaced fracture of the greater tuberosity and humeral neck. Alignment of the glenohumeral joint not well visualized. Associated overlying subcutaneus soft tissue edema. IMPRESSION: 1. Poorly visualized displaced fracture of the greater tuberosity and humeral neck. 2. Alignment of the glenohumeral joint not well visualized. Electronically Signed   By: Tish Frederickson M.D.   On: 09/11/2020 02:46     Positive ROS: All other systems have been reviewed and were otherwise negative with the exception of those mentioned in the HPI and as above.  Physical Exam: General: Alert, no acute distress Cardiovascular: No pedal edema Respiratory: No cyanosis, no use of accessory musculature GI: No organomegaly, abdomen is soft and non-tender Skin: No lesions in the area of chief complaint Neurologic: Sensation intact distally Psychiatric: Patient is competent for consent with normal mood and affect Lymphatic: No axillary or cervical lymphadenopathy  MUSCULOSKELETAL: Patient is minimally conversant.  She is awake.  There is swelling and bruising around the proximal right shoulder.  Passive motion in site some pain.  Neurovascular status is good distally.  Skin is intact.  Assessment: Minimally displaced fracture right proximal humerus surgical neck and greater tuberosity  Plan: The patient appears to be nonoperative due to multiple medical problems and dementia.  She will be treated with a sling and ice packs.  A new sling which will fit more appropriately as been ordered.  She will need follow-up in my office in 2 weeks.    Valinda Hoar, MD [phone]   09/12/2020 5:05 PM

## 2020-09-12 NOTE — Progress Notes (Signed)
PROGRESS NOTE  Elizabeth [name] TFT:[id number] DOB: [birth year] DOA: 09/10/2020 PCP: Barbette Reichmann, MD  HPI/Recap of past 24 hours: This is a 87 year old Caucasian female who was admitted earlier this morning with acute onset generalized weakness recurrent fall as well as right upper extremity weakness..  Initial blood oxygenation was 88%.  Her lactic acid was elevated she received some IV fluid she has leukocytosis and tachycardia as a result she was admitted for sepsis initially was admitted slated for progressive bed.  Patient seen and examined at bedside this morning she is alert she is communicating there is some speech impediment which is old does not have any acute distress she stated she felt fine denies any fever or chills  Update : 09/12/2020: seern and examines at bedside. Denies any new complaints.  Neurology has seen today and signed off.  We will to follow-up on the 2D echo and his recommendation is to call if needed    Assessment/Plan: Active Problems:   CAP (community acquired pneumonia)  #1 community-acquired pneumonia Her lactic acid was initially 3.4 currently is 2.4 she is receiving IV fluid we will recheck that Continue ceftriaxone and azithromycin Hypoxia is improved continue O2 nasal cannula Blood cultures and sputum cultures are pending.  2.  Right-sided hemiparesis Patient has been seen by neurology MRI has been done is following he does not recommend permissive blood pressure Giving her debilitated baseline and advanced dementia he did not start her on anticoagulation PT/ OT consulted as well as speech Echocardiogram is pending.  Neurology has signed off and advised to reconsult if an alarming finding shows up on the echo.  Please follow-up on echo result  3.  Elevated troponin this might be due to demand ischemia  and sepsis    4. Right humeral fracture . Will consult orthopedic Ortho was consulted and he stated patient appears to be nonoperative  due to multiple medical problems and dementia and should be Treated conservatively with strict sling and ice pack and to follow-up in 2 weeks   5.mild  Hypertension. Will monitor.  Code Status: Full  Severity of Illness: The appropriate patient status for this patient is INPATIENT. Inpatient status is judged to be reasonable and necessary in order to provide the required intensity of service to ensure the patient's safety. The patient's presenting symptoms, physical exam findings, and initial radiographic and laboratory data in the context of their chronic comorbidities is felt to place them at high risk for further clinical deterioration. Furthermore, it is not anticipated that the patient will be medically stable for discharge from the hospital within 2 midnights of admission. The following factors support the patient status of inpatient.   " The patient's presenting symptoms include hypoxia tachycardia. " The worrisome physical exam findings include patient was hypoxic tachycardic and weakness worrisome for sepsis.  And stroke " The initial radiographic and laboratory data are worrisome because of possible stroke " The chronic co-morbidities include dementia.   * I certify that at the point of admission it is my clinical judgment that the patient will require inpatient hospital care spanning beyond 2 midnights from the point of admission due to high intensity of service, high risk for further deterioration and high frequency of surveillance required.*    Family Communication: None at bedside  Disposition Plan: We will admit to inpatient downgrading her to telemetry   Consultants:  Neurology  Procedures:  MRI  Antimicrobials:  Ceftriaxone and azithromycin  DVT prophylaxis: Lovenox   Objective: Vitals:  09/11/20 1604 09/11/20 2018 09/12/20 0440 09/12/20 0824  BP: (!) 158/77 128/85 (!) 144/86 (!) 157/77  Pulse: 90 93 85 91  Resp: 15 16 16 18   Temp: 98.2 F (36.8 C)  98.8 F (37.1 C) 97.7 F (36.5 C) 97.9 F (36.6 C)  TempSrc: Oral Oral Oral   SpO2: (!) 89% 91% 94% 91%  Weight:      Height:        Intake/Output Summary (Last 24 hours) at 09/12/2020 0837 Last data filed at 09/12/2020 0440 Gross per 24 hour  Intake 841.28 ml  Output 400 ml  Net 441.28 ml   Filed Weights   09/10/20 2300  Weight: 53.4 kg   Body mass index is 21.55 kg/m.  Exam:  . General: 87 y.o. year-old female well developed well nourished in no acute distress.  Alert and oriented x3.  Frail looking . Cardiovascular: Regular rate and rhythm with no rubs or gallops.  No thyromegaly or JVD noted.   82 Respiratory: Clear to auscultation with no wheezes or rales. Good inspiratory effort. . Abdomen: Soft nontender nondistended with normal bowel sounds x4 quadrants. . Musculoskeletal: No lower extremity edema. 2/4 pulses in all 4 extremities. . Right arm in a sling . Skin: No ulcerative lesions noted or rashes, . Neurology.  Slight speech impediment . Psychiatry: Mood is appropriate for condition and setting    Data Reviewed: CBC: Recent Labs  Lab 09/10/20 2306 09/11/20 0536  WBC 12.4* 9.9  NEUTROABS 9.5*  --   HGB 12.3 11.0*  HCT 36.4 32.1*  MCV 90.8 90.9  PLT 198 166   Basic Metabolic Panel: Recent Labs  Lab 09/10/20 2306 09/11/20 0536 09/11/20 1347  NA 136 142 140  K 3.4* 2.8* 3.3*  CL 100 116* 110  CO2 24 22 22   GLUCOSE 224* 118* 134*  BUN 30* 20 19  CREATININE 0.61 0.46 0.43*  CALCIUM 8.7* 6.0* 7.5*  MG  --  1.5*  --    GFR: Estimated Creatinine Clearance: 36.2 mL/min (A) (by C-G formula based on SCr of 0.43 mg/dL (L)). Liver Function Tests: Recent Labs  Lab 09/10/20 2306  AST 37  ALT 25  ALKPHOS 62  BILITOT 2.5*  PROT 7.0  ALBUMIN 3.6   No results for input(s): LIPASE, AMYLASE in the last 168 hours. No results for input(s): AMMONIA in the last 168 hours. Coagulation Profile: Recent Labs  Lab 09/10/20 2321 09/11/20 0536  INR 1.1 1.3*    Cardiac Enzymes: No results for input(s): CKTOTAL, CKMB, CKMBINDEX, TROPONINI in the last 168 hours. BNP (last 3 results) No results for input(s): PROBNP in the last 8760 hours. HbA1C: No results for input(s): HGBA1C in the last 72 hours. CBG: No results for input(s): GLUCAP in the last 168 hours. Lipid Profile: Recent Labs    09/11/20 0536  CHOL 85  HDL 42  LDLCALC 32  TRIG 54  CHOLHDL 2.0   Thyroid Function Tests: No results for input(s): TSH, T4TOTAL, FREET4, T3FREE, THYROIDAB in the last 72 hours. Anemia Panel: No results for input(s): VITAMINB12, FOLATE, FERRITIN, TIBC, IRON, RETICCTPCT in the last 72 hours. Urine analysis:    Component Value Date/Time   COLORURINE YELLOW (A) 09/10/2020 2323   APPEARANCEUR HAZY (A) 09/10/2020 2323   LABSPEC 1.029 09/10/2020 2323   PHURINE 5.0 09/10/2020 2323   GLUCOSEU 150 (A) 09/10/2020 2323   HGBUR MODERATE (A) 09/10/2020 2323   BILIRUBINUR NEGATIVE 09/10/2020 2323   KETONESUR 5 (A) 09/10/2020 2323   PROTEINUR  100 (A) 09/10/2020 2323   NITRITE NEGATIVE 09/10/2020 2323   LEUKOCYTESUR NEGATIVE 09/10/2020 2323   Sepsis Labs: @LABRCNTIP (procalcitonin:4,lacticidven:4)  ) Recent Results (from the past 240 hour(s))  Culture, blood (routine x 2)     Status: None (Preliminary result)   Collection Time: 09/10/20 11:05 PM   Specimen: BLOOD  Result Value Ref Range Status   Specimen Description BLOOD BLOOD RIGHT FOREARM  Final   Special Requests   Final    BOTTLES DRAWN AEROBIC AND ANAEROBIC Blood Culture results may not be optimal due to an inadequate volume of blood received in culture bottles   Culture   Final    NO GROWTH 2 DAYS Performed at [facility], [address] [place], [place] [address]    Report Status PENDING  Incomplete  Resp Panel by RT-PCR (Flu A&B, Covid) Nasopharyngeal Swab     Status: None   Collection Time: 09/10/20 11:23 PM   Specimen: Nasopharyngeal Swab; Nasopharyngeal(NP) swabs in vial  transport medium  Result Value Ref Range Status   SARS Coronavirus 2 by RT PCR NEGATIVE NEGATIVE Final    Comment: (NOTE) SARS-CoV-2 target nucleic acids are NOT DETECTED.  The SARS-CoV-2 RNA is generally detectable in upper respiratory specimens during the acute phase of infection. The lowest concentration of SARS-CoV-2 viral copies this assay can detect is 138 copies/mL. A negative result does not preclude SARS-Cov-2 infection and should not be used as the sole basis for treatment or other patient management decisions. A negative result may occur with  improper specimen collection/handling, submission of specimen other than nasopharyngeal swab, presence of viral mutation(s) within the areas targeted by this assay, and inadequate number of viral copies(<138 copies/mL). A negative result must be combined with clinical observations, patient history, and epidemiological information. The expected result is Negative.  Fact Sheet for Patients:  BloggerCourse.comhttps://www.fda.gov/media/152166/download  Fact Sheet for Healthcare Providers:  SeriousBroker.ithttps://www.fda.gov/media/152162/download  This test is no t yet approved or cleared by the [place] FDA and  has been authorized for detection and/or diagnosis of SARS-CoV-2 by FDA under an Emergency Use Authorization (EUA). This EUA will remain  in effect (meaning this test can be used) for the duration of the COVID-19 declaration under Section 564(b)(1) of the Act, 21 U.S.C.section 360bbb-3(b)(1), unless the authorization is terminated  or revoked sooner.       Influenza A by PCR NEGATIVE NEGATIVE Final   Influenza B by PCR NEGATIVE NEGATIVE Final    Comment: (NOTE) The Xpert Xpress SARS-CoV-2/FLU/RSV plus assay is intended as an aid in the diagnosis of influenza from Nasopharyngeal swab specimens and should not be used as a sole basis for treatment. Nasal washings and aspirates are unacceptable for Xpert Xpress SARS-CoV-2/FLU/RSV testing.  Fact  Sheet for Patients: BloggerCourse.comhttps://www.fda.gov/media/152166/download  Fact Sheet for Healthcare Providers: SeriousBroker.ithttps://www.fda.gov/media/152162/download  This test is not yet approved or cleared by the [place] FDA and has been authorized for detection and/or diagnosis of SARS-CoV-2 by FDA under an Emergency Use Authorization (EUA). This EUA will remain in effect (meaning this test can be used) for the duration of the COVID-19 declaration under Section 564(b)(1) of the Act, 21 U.S.C. section 360bbb-3(b)(1), unless the authorization is terminated or revoked.  Performed at [facility], [address] [place], [place] [address]   Culture, blood (routine x 2)     Status: None (Preliminary result)   Collection Time: 09/10/20 11:24 PM   Specimen: BLOOD  Result Value Ref Range Status   Specimen Description BLOOD BLOOD LEFT  WRIST  Final   Special Requests   Final    BOTTLES DRAWN AEROBIC AND ANAEROBIC Blood Culture results may not be optimal due to an inadequate volume of blood received in culture bottles   Culture  Setup Time   Final    Organism ID to follow GRAM POSITIVE COCCI AEROBIC BOTTLE ONLY CRITICAL RESULT CALLED TO, READ BACK BY AND VERIFIED WITH: CARISSA DOLAN ON 09/11/20 AT 1918 QSD Performed at [facility], [address] [place], [place] [address]    Culture [facility] POSITIVE COCCI  Final   Report Status PENDING  Incomplete  Blood Culture ID Panel (Reflexed)     Status: Abnormal   Collection Time: 09/10/20 11:24 PM  Result Value Ref Range Status   Enterococcus faecalis NOT DETECTED NOT DETECTED Final   Enterococcus Faecium NOT DETECTED NOT DETECTED Final   Listeria monocytogenes NOT DETECTED NOT DETECTED Final   Staphylococcus species DETECTED (A) NOT DETECTED Final    Comment: CRITICAL RESULT CALLED TO, READ BACK BY AND VERIFIED WITH: CARISSA DOLAN ON 09/11/20 AT 1918 QSD    Staphylococcus aureus (BCID) NOT DETECTED NOT DETECTED Final   Staphylococcus  epidermidis DETECTED (A) NOT DETECTED Final    Comment: Methicillin (oxacillin) resistant coagulase negative staphylococcus. Possible blood culture contaminant (unless isolated from more than one blood culture draw or clinical case suggests pathogenicity). No antibiotic treatment is indicated for blood  culture contaminants. CRITICAL RESULT CALLED TO, READ BACK BY AND VERIFIED WITH: CARISSA DOLAN ON 09/11/20 AT 1918 QSD    Staphylococcus lugdunensis NOT DETECTED NOT DETECTED Final   Streptococcus species NOT DETECTED NOT DETECTED Final   Streptococcus agalactiae NOT DETECTED NOT DETECTED Final   Streptococcus pneumoniae NOT DETECTED NOT DETECTED Final   Streptococcus pyogenes NOT DETECTED NOT DETECTED Final   A.calcoaceticus-baumannii NOT DETECTED NOT DETECTED Final   Bacteroides fragilis NOT DETECTED NOT DETECTED Final   Enterobacterales NOT DETECTED NOT DETECTED Final   Enterobacter cloacae complex NOT DETECTED NOT DETECTED Final   Escherichia coli NOT DETECTED NOT DETECTED Final   Klebsiella aerogenes NOT DETECTED NOT DETECTED Final   Klebsiella oxytoca NOT DETECTED NOT DETECTED Final   Klebsiella pneumoniae NOT DETECTED NOT DETECTED Final   Proteus species NOT DETECTED NOT DETECTED Final   Salmonella species NOT DETECTED NOT DETECTED Final   Serratia marcescens NOT DETECTED NOT DETECTED Final   Haemophilus influenzae NOT DETECTED NOT DETECTED Final   Neisseria meningitidis NOT DETECTED NOT DETECTED Final   Pseudomonas aeruginosa NOT DETECTED NOT DETECTED Final   Stenotrophomonas maltophilia NOT DETECTED NOT DETECTED Final   Candida albicans NOT DETECTED NOT DETECTED Final   Candida auris NOT DETECTED NOT DETECTED Final   Candida glabrata NOT DETECTED NOT DETECTED Final   Candida krusei NOT DETECTED NOT DETECTED Final   Candida parapsilosis NOT DETECTED NOT DETECTED Final   Candida tropicalis NOT DETECTED NOT DETECTED Final   Cryptococcus neoformans/gattii NOT DETECTED NOT DETECTED  Final   Methicillin resistance mecA/C DETECTED (A) NOT DETECTED Final    Comment: CRITICAL RESULT CALLED TO, READ BACK BY AND VERIFIED WITH: CARISSA DOLAN ON 09/11/20 AT 1918 QSD Performed at [facility], [address] [place], [place] [address]       Studies: MR ANGIO HEAD WO CONTRAST  Result Date: 09/11/2020 CLINICAL DATA:  Right extremity weakness with recurrent falls. Acute infarction left pons EXAM: MRA HEAD WITHOUT CONTRAST TECHNIQUE: Angiographic images of the Circle of Willis were acquired using MRA technique without intravenous contrast. COMPARISON:  MRI same day. FINDINGS: Anterior circulation: Both internal carotid arteries widely patent through the skull base and siphon regions. Anterior and middle cerebral vessels show flow. Moderate atherosclerotic irregularity of the more distal branch vessels. Posterior circulation: Both vertebral arteries widely patent to the basilar. Artifactual signal loss in both distal vertebral artery V4 segments. No basilar stenosis. Posterior circulation branch vessels show flow. Flow present in both superior cerebellar and posterior cerebral arteries. There is severe stenotic disease within both P1 P2 junction regions. Anatomic variants: None significant Other: None. IMPRESSION: Both vertebral arteries appear widely patent to the basilar. No basilar stenosis. Severe stenotic disease within the posterior cerebral arteries, particularly at the P1 P2 junction regions. Electronically Signed   By: Paulina Fusi M.D.   On: 09/11/2020 13:56    Scheduled Meds: .  stroke: mapping our early stages of recovery book   Does not apply Once  . aspirin  81 mg Oral Daily  . calcium-vitamin D  1 tablet Oral BID  . clopidogrel  75 mg Oral Daily  . enoxaparin (LOVENOX) injection  40 mg Subcutaneous Q24H  . guaiFENesin  600 mg Oral BID  . rosuvastatin  20 mg Oral Daily  . sodium chloride flush  3 mL Intravenous Once  . vitamin B-12  250 mcg Oral Q1200     Continuous Infusions: . sodium chloride 100 mL/hr at 09/12/20 0047  . azithromycin 500 mg (09/12/20 0159)  . cefTRIAXone (ROCEPHIN)  IV 2 g (09/12/20 0048)     LOS: 1 day     Myrtie Neither, MD [facility]  To reach me or the doctor on call, go to: [email or web address] Password [facility]  09/12/2020, 8:37 AM

## 2020-09-12 NOTE — Progress Notes (Signed)
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 She is on optimal medical management with DAPT and statins Echo - pending. Unless there is some alarming finding such as LV thrombus or severe LA dilatation concerning for paroxysmal Afib, continue with DAPT. Goal A1c<7 and Goal LDL<70. Follow up with outpatient neurology in 4-6 weeks after discharge. Please call back if echo is abnormal. Plan relayed to Dr. Barrie Folk over secure chat. -- Milon Dikes, MD Neurologist  [facility] Pager: [phone]

## 2020-09-12 NOTE — Progress Notes (Signed)
OT Cancellation Note  Patient Details Name: Keeghan Mcintire MRN: [id number] DOB: 03/15/1928   Cancelled Treatment:    Reason Eval/Treat Not Completed: Medical issues which prohibited therapy. Orders received and chart reviewed. Per chart review, pt awaiting orthopedic consult for right humeral fracture. OT to initiate services following orthopedic consult.  Matthew Folks, OTR/L [facility] [phone]

## 2020-09-12 NOTE — Progress Notes (Signed)
PT Cancellation Note  Patient Details Name: Elizabeth [name] MRN: [id number] DOB: 04-18-1927   Cancelled Treatment:    Reason Eval/Treat Not Completed: Patient not medically ready PT orders received, chart reviewed. Pt noted to have fx of greater tuberosity & R humeral neck & is awaiting ortho consult. Will hold PT evaluation until seen by ortho & f/u as able & as medically appropriate.   Aleda Grana, PT, DPT 09/12/20, 2:01 PM    Sandi Mariscal 09/12/2020, 2:01 PM

## 2020-09-13 DIAGNOSIS — R4182 Altered mental status, unspecified: Secondary | ICD-10-CM

## 2020-09-13 DIAGNOSIS — Z515 Encounter for palliative care: Secondary | ICD-10-CM

## 2020-09-13 DIAGNOSIS — R0902 Hypoxemia: Secondary | ICD-10-CM

## 2020-09-13 DIAGNOSIS — Z66 Do not resuscitate: Secondary | ICD-10-CM

## 2020-09-13 DIAGNOSIS — Z7189 Other specified counseling: Secondary | ICD-10-CM

## 2020-09-13 DIAGNOSIS — W19XXXA Unspecified fall, initial encounter: Secondary | ICD-10-CM

## 2020-09-13 DIAGNOSIS — S42211A Unspecified displaced fracture of surgical neck of right humerus, initial encounter for closed fracture: Secondary | ICD-10-CM

## 2020-09-13 LAB — HEMOGLOBIN A1C
Hgb A1c MFr Bld: 6.1 % — ABNORMAL HIGH (ref 4.8–5.6)
Mean Plasma Glucose: 128 mg/dL

## 2020-09-13 MED ORDER — MORPHINE SULFATE (PF) 2 MG/ML IV SOLN: 2.0000 mg | INTRAVENOUS | Status: DC | PRN

## 2020-09-13 MED ORDER — POTASSIUM CHLORIDE 10 MEQ/100ML IV SOLN
10.0000 meq | INTRAVENOUS | Status: AC
  Administered 2020-09-13 (×2): 10 meq via INTRAVENOUS
  Filled 2020-09-13: qty 100

## 2020-09-13 MED ORDER — GLYCOPYRROLATE 0.2 MG/ML IJ SOLN: 0.2000 mg | INTRAMUSCULAR | Status: DC | PRN

## 2020-09-13 MED ORDER — BIOTENE DRY MOUTH MT LIQD: 15.0000 mL | OROMUCOSAL | Status: DC | PRN

## 2020-09-13 MED ORDER — POLYVINYL ALCOHOL 1.4 % OP SOLN
1.0000 [drp] | Freq: Four times a day (QID) | OPHTHALMIC | Status: DC | PRN
  Filled 2020-09-13: qty 15

## 2020-09-13 MED ORDER — POTASSIUM CHLORIDE CRYS ER 20 MEQ PO TBCR: 20.0000 meq | EXTENDED_RELEASE_TABLET | Freq: Once | ORAL | Status: DC

## 2020-09-13 MED ORDER — AMLODIPINE BESYLATE 5 MG PO TABS: 2.5000 mg | ORAL_TABLET | Freq: Every day | ORAL | Status: DC

## 2020-09-13 MED ORDER — POTASSIUM CHLORIDE CRYS ER 20 MEQ PO TBCR: 40.0000 meq | EXTENDED_RELEASE_TABLET | Freq: Once | ORAL | Status: DC

## 2020-09-13 MED ORDER — HALOPERIDOL LACTATE 5 MG/ML IJ SOLN: 0.5000 mg | INTRAMUSCULAR | Status: DC | PRN

## 2020-09-13 NOTE — Progress Notes (Signed)
PROGRESS NOTE    Elizabeth PennyHelen Miles Gwyn  BJY:[id number] DOB: 06/01/1927 DOA: 09/10/2020 PCP: Barbette ReichmannHande, Vishwanath, MD   Brief Narrative: 87 year old with past medical history significant for Alzheimer's dementia, dyslipidemia, CAD who presents with new onset of generalized weakness and recurrent fall as well as right upper extremity weakness with bruising and swelling which has been going on over the last 48 hours.  Evaluation in the ED patient was found to be hypoxic oxygen sat of 90 on room air placed on 2 L of oxygen.  BNP mildly elevated 747, troponin was 224, UA unremarkable.  Assessment & Plan:   Active Problems:   CAP (community acquired pneumonia)   1-Community-Acquired Pneumonia: Acute hypoxic respiratory failure: Presented with lactic acidosis, she received IV fluids and IV antibiotics. He is at risk for aspiration.  Discussed with POA poor prognosis and risk of recurrent infection.  They would like to proceed with palliative care or hospice care at home.   Acute stroke Patient presented with right side hemiparesis, difficulty swallowing. MRI: Small acute infarct in the anterior left paracentral pons, basilar perforator artery territory.  No associated hemorrhage or mass-effect. MRA; severely stenotic disease within the posterior cerebral artery. Received aspirin  and Plavix during this hospitalization.  Subsequently discontinue, patient has been transition to hospice care.   Elevation of troponin 224---159. Demand ischemia.  No wall motion abnormality. Further evaluation, plan for hospice care.  Blood culture positive for staph epidermidis: one out of two;  Likely a contaminant. To transition to comfort care   Right humeral neck fracture Poorly visualized displaced fracture of the greater tuberosity and humeral neck. He is not a candidate for surgery, continue with sling  Hypokalemia: Received IV potassium supplementation.     Estimated body mass index is 21.55  kg/m as calculated from the following:   Height as of this encounter: 5\' 2"  (1.575 m).   Weight as of this encounter: 53.4 kg.   DVT prophylaxis: Receive anticoagulation during this hospitalization, discontinued due to comfort care Code Status: DNR: I discussed with POA, patient would not want to be resuscitated or be on life support. Family Communication: POA, sister-in-law Becky who was at bedside Disposition Plan:  Status is: Inpatient  Remains inpatient appropriate because:IV treatments appropriate due to intensity of illness or inability to take PO and Inpatient level of care appropriate due to severity of illness   Dispo: The patient is from: Home              Anticipated d/c is to: Home with Hospice.               Patient currently is not medically stable to d/c. plan to discharge home with hospice tomorrow when medical su[pply available.    Difficult to place patient No        Consultants:   Palliative  Neurology  Ortho    Subjective: Alert, very weak. She agrees with Kriste BasqueBecky making her medical decision. She doesn't feel well. She received pain medication.   Objective: Vitals:   09/12/20 1640 09/12/20 2022 09/13/20 0020 09/13/20 0603  BP: (!) 161/82 (!) 170/76 135/80 (!) 144/134  Pulse: 94 96 88 87  Resp: 20 18 19 18   Temp: 98.4 F (36.9 C) 98.8 F (37.1 C) (!) 97.3 F (36.3 C) 98.8 F (37.1 C)  TempSrc:   Oral   SpO2: 93% 91% 93% 91%  Weight:      Height:        Intake/Output Summary (Last 24 hours)  at 09/13/2020 0744 Last data filed at 09/13/2020 0601 Gross per 24 hour  Intake --  Output 800 ml  Net -800 ml   Filed Weights   09/10/20 2300  Weight: 53.4 kg    Examination:  General exam: Chronic ill appearing.  Respiratory system: Clear to auscultation. Respiratory effort normal. Cardiovascular system: S1 & S2 heard, RRR. No JVD, murmurs, rubs, gallops or clicks. No pedal edema. Gastrointestinal system: Abdomen is nondistended, soft and  nontender. No organomegaly or masses felt. Normal bowel sounds heard. Central nervous system: Alert . Speech soft, slurred at time.  Extremities: trace edma   Data Reviewed: I have personally reviewed following labs and imaging studies  CBC: Recent Labs  Lab 09/10/20 2306 09/11/20 0536  WBC 12.4* 9.9  NEUTROABS 9.5*  --   HGB 12.3 11.0*  HCT 36.4 32.1*  MCV 90.8 90.9  PLT 198 166   Basic Metabolic Panel: Recent Labs  Lab 09/10/20 2306 09/11/20 0536 09/11/20 1347 09/12/20 1803  NA 136 142 140 136  K 3.4* 2.8* 3.3* 2.9*  CL 100 116* 110 104  CO2 24 22 22 23   GLUCOSE 224* 118* 134* 163*  BUN 30* 20 19 13   CREATININE 0.61 0.46 0.43* 0.47  CALCIUM 8.7* 6.0* 7.5* 7.8*  MG  --  1.5*  --   --    GFR: Estimated Creatinine Clearance: 36.2 mL/min (by C-G formula based on SCr of 0.47 mg/dL). Liver Function Tests: Recent Labs  Lab 09/10/20 2306  AST 37  ALT 25  ALKPHOS 62  BILITOT 2.5*  PROT 7.0  ALBUMIN 3.6   No results for input(s): LIPASE, AMYLASE in the last 168 hours. No results for input(s): AMMONIA in the last 168 hours. Coagulation Profile: Recent Labs  Lab 09/10/20 2321 09/11/20 0536  INR 1.1 1.3*   Cardiac Enzymes: No results for input(s): CKTOTAL, CKMB, CKMBINDEX, TROPONINI in the last 168 hours. BNP (last 3 results) No results for input(s): PROBNP in the last 8760 hours. HbA1C: No results for input(s): HGBA1C in the last 72 hours. CBG: No results for input(s): GLUCAP in the last 168 hours. Lipid Profile: Recent Labs    09/11/20 0536  CHOL 85  HDL 42  LDLCALC 32  TRIG 54  CHOLHDL 2.0   Thyroid Function Tests: No results for input(s): TSH, T4TOTAL, FREET4, T3FREE, THYROIDAB in the last 72 hours. Anemia Panel: No results for input(s): VITAMINB12, FOLATE, FERRITIN, TIBC, IRON, RETICCTPCT in the last 72 hours. Sepsis Labs: Recent Labs  Lab 09/10/20 2305 09/11/20 0315 09/11/20 0536 09/11/20 1347 09/11/20 1639  PROCALCITON  --   --  0.33   --   --   LATICACIDVEN 3.4* 2.4*  --  1.3 2.1*    Recent Results (from the past 240 hour(s))  Culture, blood (routine x 2)     Status: None (Preliminary result)   Collection Time: 09/10/20 11:05 PM   Specimen: BLOOD  Result Value Ref Range Status   Specimen Description BLOOD BLOOD RIGHT FOREARM  Final   Special Requests   Final    BOTTLES DRAWN AEROBIC AND ANAEROBIC Blood Culture results may not be optimal due to an inadequate volume of blood received in culture bottles   Culture   Final    NO GROWTH 2 DAYS Performed at [facility], [address] [place], [address] [place]    Report Status PENDING  Incomplete  Urine culture     Status: Abnormal   Collection Time: 09/10/20 11:23 PM  Specimen: Urine, Random  Result Value Ref Range Status   Specimen Description   Final    URINE, RANDOM Performed at [facility], [address] [place], [place] [address]    Special Requests   Final    NONE Performed at [facility], [address] [place], [place] [address]    Culture MULTIPLE SPECIES PRESENT, SUGGEST RECOLLECTION (A)  Final   Report Status 09/12/2020 FINAL  Final  Resp Panel by RT-PCR (Flu A&B, Covid) Nasopharyngeal Swab     Status: None   Collection Time: 09/10/20 11:23 PM   Specimen: Nasopharyngeal Swab; Nasopharyngeal(NP) swabs in vial transport medium  Result Value Ref Range Status   SARS Coronavirus 2 by RT PCR NEGATIVE NEGATIVE Final    Comment: (NOTE) SARS-CoV-2 target nucleic acids are NOT DETECTED.  The SARS-CoV-2 RNA is generally detectable in upper respiratory specimens during the acute phase of infection. The lowest concentration of SARS-CoV-2 viral copies this assay can detect is 138 copies/mL. A negative result does not preclude SARS-Cov-2 infection and should not be used as the sole basis for treatment or other patient management decisions. A negative result may occur with  improper specimen collection/handling, submission  of specimen other than nasopharyngeal swab, presence of viral mutation(s) within the areas targeted by this assay, and inadequate number of viral copies(<138 copies/mL). A negative result must be combined with clinical observations, patient history, and epidemiological information. The expected result is Negative.  Fact Sheet for Patients:  BloggerCourse.com  Fact Sheet for Healthcare Providers:  SeriousBroker.it  This test is no t yet approved or cleared by the [place] FDA and  has been authorized for detection and/or diagnosis of SARS-CoV-2 by FDA under an Emergency Use Authorization (EUA). This EUA will remain  in effect (meaning this test can be used) for the duration of the COVID-19 declaration under Section 564(b)(1) of the Act, 21 U.S.C.section 360bbb-3(b)(1), unless the authorization is terminated  or revoked sooner.       Influenza A by PCR NEGATIVE NEGATIVE Final   Influenza B by PCR NEGATIVE NEGATIVE Final    Comment: (NOTE) The Xpert Xpress SARS-CoV-2/FLU/RSV plus assay is intended as an aid in the diagnosis of influenza from Nasopharyngeal swab specimens and should not be used as a sole basis for treatment. Nasal washings and aspirates are unacceptable for Xpert Xpress SARS-CoV-2/FLU/RSV testing.  Fact Sheet for Patients: BloggerCourse.com  Fact Sheet for Healthcare Providers: SeriousBroker.it  This test is not yet approved or cleared by the [place] FDA and has been authorized for detection and/or diagnosis of SARS-CoV-2 by FDA under an Emergency Use Authorization (EUA). This EUA will remain in effect (meaning this test can be used) for the duration of the COVID-19 declaration under Section 564(b)(1) of the Act, 21 U.S.C. section 360bbb-3(b)(1), unless the authorization is terminated or revoked.  Performed at [facility], [address] [place], [place] [address]   Culture, blood (routine x 2)     Status: None (Preliminary result)   Collection Time: 09/10/20 11:24 PM   Specimen: BLOOD  Result Value Ref Range Status   Specimen Description BLOOD BLOOD LEFT WRIST  Final   Special Requests   Final    BOTTLES DRAWN AEROBIC AND ANAEROBIC Blood Culture results may not be optimal due to an inadequate volume of blood received in culture bottles   Culture  Setup Time   Final    Organism ID to follow GRAM POSITIVE COCCI AEROBIC BOTTLE ONLY CRITICAL RESULT  CALLED TO, READ BACK BY AND VERIFIED WITH: CARISSA DOLAN ON 09/11/20 AT 1918 QSD Performed at [facility], [address] [place], [place] [address]    Culture [facility] POSITIVE COCCI  Final   Report Status PENDING  Incomplete  Blood Culture ID Panel (Reflexed)     Status: Abnormal   Collection Time: 09/10/20 11:24 PM  Result Value Ref Range Status   Enterococcus faecalis NOT DETECTED NOT DETECTED Final   Enterococcus Faecium NOT DETECTED NOT DETECTED Final   Listeria monocytogenes NOT DETECTED NOT DETECTED Final   Staphylococcus species DETECTED (A) NOT DETECTED Final    Comment: CRITICAL RESULT CALLED TO, READ BACK BY AND VERIFIED WITH: CARISSA DOLAN ON 09/11/20 AT 1918 QSD    Staphylococcus aureus (BCID) NOT DETECTED NOT DETECTED Final   Staphylococcus epidermidis DETECTED (A) NOT DETECTED Final    Comment: Methicillin (oxacillin) resistant coagulase negative staphylococcus. Possible blood culture contaminant (unless isolated from more than one blood culture draw or clinical case suggests pathogenicity). No antibiotic treatment is indicated for blood  culture contaminants. CRITICAL RESULT CALLED TO, READ BACK BY AND VERIFIED WITH: CARISSA DOLAN ON 09/11/20 AT 1918 QSD    Staphylococcus lugdunensis NOT DETECTED NOT DETECTED Final   Streptococcus species NOT DETECTED NOT DETECTED Final   Streptococcus agalactiae NOT DETECTED NOT DETECTED Final   Streptococcus  pneumoniae NOT DETECTED NOT DETECTED Final   Streptococcus pyogenes NOT DETECTED NOT DETECTED Final   A.calcoaceticus-baumannii NOT DETECTED NOT DETECTED Final   Bacteroides fragilis NOT DETECTED NOT DETECTED Final   Enterobacterales NOT DETECTED NOT DETECTED Final   Enterobacter cloacae complex NOT DETECTED NOT DETECTED Final   Escherichia coli NOT DETECTED NOT DETECTED Final   Klebsiella aerogenes NOT DETECTED NOT DETECTED Final   Klebsiella oxytoca NOT DETECTED NOT DETECTED Final   Klebsiella pneumoniae NOT DETECTED NOT DETECTED Final   Proteus species NOT DETECTED NOT DETECTED Final   Salmonella species NOT DETECTED NOT DETECTED Final   Serratia marcescens NOT DETECTED NOT DETECTED Final   Haemophilus influenzae NOT DETECTED NOT DETECTED Final   Neisseria meningitidis NOT DETECTED NOT DETECTED Final   Pseudomonas aeruginosa NOT DETECTED NOT DETECTED Final   Stenotrophomonas maltophilia NOT DETECTED NOT DETECTED Final   Candida albicans NOT DETECTED NOT DETECTED Final   Candida auris NOT DETECTED NOT DETECTED Final   Candida glabrata NOT DETECTED NOT DETECTED Final   Candida krusei NOT DETECTED NOT DETECTED Final   Candida parapsilosis NOT DETECTED NOT DETECTED Final   Candida tropicalis NOT DETECTED NOT DETECTED Final   Cryptococcus neoformans/gattii NOT DETECTED NOT DETECTED Final   Methicillin resistance mecA/C DETECTED (A) NOT DETECTED Final    Comment: CRITICAL RESULT CALLED TO, READ BACK BY AND VERIFIED WITH: CARISSA DOLAN ON 09/11/20 AT 1918 QSD Performed at [facility], [address], [place], [place] [address]          Radiology Studies: DG Shoulder Right  Result Date: 09/12/2020 CLINICAL DATA:  Known right humeral head fracture. EXAM: RIGHT SHOULDER - 2+ VIEW COMPARISON:  September 11, 2020. FINDINGS: Unchanged alignment of the displaced fracture of the greater tuberosity and humeral neck. Inferior displacement of the humeral head in relation to the glenoid,  likely sequela of a joint effusion. Visualized lung fields are clear. IMPRESSION: Unchanged alignment of the displaced fracture of the greater tuberosity and humeral neck. Electronically Signed   By: Maudry Mayhew MD   On: 09/12/2020 11:40   MR ANGIO HEAD WO CONTRAST  Result Date: 09/11/2020  CLINICAL DATA:  Right extremity weakness with recurrent falls. Acute infarction left pons EXAM: MRA HEAD WITHOUT CONTRAST TECHNIQUE: Angiographic images of the Circle of Willis were acquired using MRA technique without intravenous contrast. COMPARISON:  MRI same day. FINDINGS: Anterior circulation: Both internal carotid arteries widely patent through the skull base and siphon regions. Anterior and middle cerebral vessels show flow. Moderate atherosclerotic irregularity of the more distal branch vessels. Posterior circulation: Both vertebral arteries widely patent to the basilar. Artifactual signal loss in both distal vertebral artery V4 segments. No basilar stenosis. Posterior circulation branch vessels show flow. Flow present in both superior cerebellar and posterior cerebral arteries. There is severe stenotic disease within both P1 P2 junction regions. Anatomic variants: None significant Other: None. IMPRESSION: Both vertebral arteries appear widely patent to the basilar. No basilar stenosis. Severe stenotic disease within the posterior cerebral arteries, particularly at the P1 P2 junction regions. Electronically Signed   By: Paulina Fusi M.D.   On: 09/11/2020 13:56   ECHOCARDIOGRAM COMPLETE BUBBLE STUDY  Result Date: 09/12/2020    ECHOCARDIOGRAM REPORT   Patient Name:   Elizabeth [name] Date of Exam: 09/12/2020 Medical Rec #:  [id number]             Height:       62.0 in Accession #:    [id number]            Weight:       117.8 lb Date of Birth:  02/27/1928              BSA:          1.527 m Patient Age:    89 years              BP:           141/85 mmHg Patient Gender: F                     HR:           103 bpm.  Exam Location:  [facility] Procedure: 2D Echo and Saline Contrast Bubble Study Indications:     Stroke  History:         Patient has prior history of Echocardiogram examinations.                  Previous Myocardial Infarction and CAD; Stroke.  Sonographer:     L Thornton-[place] Referring Phys:  [id number] Vernetta Honey MANSY Diagnosing Phys: Yvonne Kendall MD  Sonographer Comments: Image acquisition challenging due to breast implants. IMPRESSIONS  1. Left ventricular ejection fraction, by estimation, is 65 to 70%. The left ventricle has normal function. The left ventricle has no regional wall motion abnormalities. Left ventricular diastolic parameters are consistent with Grade I diastolic dysfunction (impaired relaxation).  2. Right ventricular systolic function is mildly reduced. The right ventricular size is mildly enlarged.  3. Left atrial size was mildly dilated.  4. The mitral valve is abnormal. Trivial mitral valve regurgitation. No evidence of mitral stenosis.  5. Tricuspid valve regurgitation is moderate to severe.  6. The aortic valve is tricuspid. There is mild thickening of the aortic valve. Aortic valve regurgitation is not visualized. Mild aortic valve sclerosis is present, with no evidence of aortic valve stenosis.  7. Bubble study is nondiagnostic for intracardiac shunting. FINDINGS  Left Ventricle: Left ventricular ejection fraction, by estimation, is 65 to 70%. The left ventricle has normal function. The left ventricle has no regional wall motion abnormalities. The  left ventricular internal cavity size was normal in size. There is  borderline left ventricular hypertrophy. Left ventricular diastolic parameters are consistent with Grade I diastolic dysfunction (impaired relaxation). Right Ventricle: Pulmonary artery pressure is moderately to severely elevated (PASP 55-60 mmHg plus central venous pressure). The right ventricular size is mildly enlarged. No increase in right ventricular wall thickness. Right  ventricular systolic function is mildly reduced. Left Atrium: Left atrial size was mildly dilated. Right Atrium: Right atrial size was normal in size. Pericardium: There is no evidence of pericardial effusion. Mitral Valve: The mitral valve is abnormal. Mild mitral annular calcification. Trivial mitral valve regurgitation. No evidence of mitral valve stenosis. Tricuspid Valve: The tricuspid valve is normal in structure. Tricuspid valve regurgitation is moderate to severe. Aortic Valve: The aortic valve is tricuspid. There is mild thickening of the aortic valve. Aortic valve regurgitation is not visualized. Mild aortic valve sclerosis is present, with no evidence of aortic valve stenosis. Aortic valve peak gradient measures 10.8 mmHg. Pulmonic Valve: The pulmonic valve was grossly normal. Pulmonic valve regurgitation is trivial. No evidence of pulmonic stenosis. Aorta: The aortic root is normal in size and structure. Pulmonary Artery: The pulmonary artery is not well seen. Venous: The inferior vena cava was not well visualized. IAS/Shunts: The interatrial septum was not well visualized. Agitated saline contrast was given intravenously to evaluate for intracardiac shunting. Bubble study is nondiagnostic for intracardiac shunting.  LEFT VENTRICLE PLAX 2D LVIDd:         3.40 cm  Diastology LVIDs:         2.43 cm  LV e' medial:    7.29 cm/s LV PW:         0.95 cm  LV E/e' medial:  8.6 LV IVS:        1.07 cm  LV e' lateral:   5.66 cm/s LVOT diam:     1.80 cm  LV E/e' lateral: 11.1 LV SV:         52 LV SV Index:   34 LVOT Area:     2.54 cm  RIGHT VENTRICLE RV S prime:     15.70 cm/s TAPSE (M-mode): 1.7 cm LEFT ATRIUM             Index       RIGHT ATRIUM           Index LA diam:        2.70 cm 1.77 cm/m  RA Area:     14.64 cm 9.59 cm/m LA Vol (A2C):   48.5 ml 31.77 ml/m LA Vol (A4C):   71.1 ml 46.57 ml/m LA Biplane Vol: 58.9 ml 38.58 ml/m  AORTIC VALVE                PULMONIC VALVE AV Area (Vmax): 1.60 cm    PV  Vmax:       1.03 m/s AV Vmax:        164.00 cm/s PV Peak grad:  4.2 mmHg AV Peak Grad:   10.8 mmHg LVOT Vmax:      103.00 cm/s LVOT Vmean:     77.500 cm/s LVOT VTI:       0.206 m  AORTA Ao Root diam: 3.10 cm MITRAL VALVE               TRICUSPID VALVE MV Area (PHT): 3.33 cm    TR Peak grad:   58.7 mmHg MV E velocity: 63.00 cm/s  TR Vmax:  383.00 cm/s MV A velocity: 84.80 cm/s MV E/A ratio:  0.74        SHUNTS                            Systemic VTI:  0.21 m                            Systemic Diam: 1.80 cm Yvonne Kendall MD Electronically signed by Yvonne Kendall MD Signature Date/Time: 09/12/2020/6:33:40 PM    Final         Scheduled Meds: .  stroke: mapping our early stages of recovery book   Does not apply Once  . aspirin  81 mg Oral Daily  . calcium-vitamin D  1 tablet Oral BID  . clopidogrel  75 mg Oral Daily  . enoxaparin (LOVENOX) injection  40 mg Subcutaneous Q24H  . guaiFENesin  600 mg Oral BID  . rosuvastatin  20 mg Oral Daily  . sodium chloride flush  3 mL Intravenous Once  . vitamin B-12  250 mcg Oral Q1200   Continuous Infusions: . sodium chloride 100 mL/hr at 09/12/20 1504  . azithromycin 500 mg (09/13/20 0214)  . cefTRIAXone (ROCEPHIN)  IV 2 g (09/13/20 0136)     LOS: 2 days    Time spent: 35 minutes.     Alba Cory, MD [facility]   If 7PM-7AM, please contact night-coverage [email or web address]  09/13/2020, 7:44 AM

## 2020-09-13 NOTE — Evaluation (Signed)
Clinical/Bedside Swallow Evaluation Patient Details  Name: Elizabeth [name] MRN: [id number] Date of Birth: 07-15-1927  Today's Date: 09/13/2020 Time: SLP Start Time (ACUTE ONLY): 0920 SLP Stop Time (ACUTE ONLY): 0950 SLP Time Calculation (min) (ACUTE ONLY): 30 min  Past Medical History:  Past Medical History:  Diagnosis Date  . CAD (coronary artery disease)    a.  Inferolateral STEMI 09/02/2019  . Dementia (HCC)   . History of CVA (cerebrovascular accident)   . Hyperlipidemia LDL goal <70    Past Surgical History:  Past Surgical History:  Procedure Laterality Date  . CORONARY/GRAFT ACUTE MI REVASCULARIZATION N/A 09/02/2019   Procedure: Coronary/Graft Acute MI Revascularization;  Surgeon: Yvonne Kendall, MD;  Location: [facility] INVASIVE CV LAB;  Service: Cardiovascular;  Laterality: N/A;  . LEFT HEART CATH AND CORONARY ANGIOGRAPHY N/A 09/02/2019   Procedure: LEFT HEART CATH AND CORONARY ANGIOGRAPHY;  Surgeon: Yvonne Kendall, MD;  Location: [facility] INVASIVE CV LAB;  Service: Cardiovascular;  Laterality: N/A;   HPI:  Elizabeth [name] is a 87 y.o. female who complains of right shoulder pain.  The patient has been weak and falling at home last few days and presented to the emergency room Friday night 09/10/2020.  She was found to have multiple medical problems plus a minimally displaced fracture of the right proximal humerus surgical neck and greater tuberosity. MRI revealed Small acute infarct in the anterior left paracentral pons, basilar perforator artery territory. No associated hemorrhage or  mass effect. Chest x-ray revealed Bibasilar opacities, favor atelectasis. Pt has advanced dementia with 24 hour caregivers in place at baseline.   Assessment / Plan / Recommendation Clinical Impression  Pt is alert, pleasant, oriented to self and is able to intermittently answer simple yes/no questions such as "Are you in pain?" while pt was sitting upright, she was not able to hold the spoon or cup  d/t guarding with her left arm. Pt is at high risk of aspiration as she presents with severe oropharyngeal dysphagia. Pt's dysphagia is likely multi-factorial - acute CVA, advanced dementia, severe pain (morphine is currently not effective in managing pt's pain).  As such, her oral phase is lengthy with poor mastication. When consuming thin liquids, pt intermittently holds bolus and swallow initiation appears laborsome. Pt's POA was present and education was provided on high risk of aspiration, recommendation for Palliative Care consult to establish goals of care. Communicated with pt, POA, nurse and MD that should comfort care become an option, sips of thin liquids and bites of puree would be recommendation. SLP Visit Diagnosis: Dysphagia, unspecified (R13.10)    Aspiration Risk  Severe aspiration risk;Risk for inadequate nutrition/hydration    Diet Recommendation NPO   Medication Administration: Via alternative means Compensations: Minimize environmental distractions;Slow rate;Small sips/bites Postural Changes: Seated upright at 90 degrees;Remain upright for at least 30 minutes after po intake    Other  Recommendations Oral Care Recommendations: Oral care QID   Follow up Recommendations  (Palliative Care/Hospice Consult)      Frequency and Duration            Prognosis Prognosis for Safe Diet Advancement:  (poor) Barriers to Reach Goals: Cognitive deficits;Severity of deficits      Swallow Study   General Date of Onset: 09/11/20 HPI: Elizabeth [name] is a 87 y.o. female who complains of right shoulder pain.  The patient has been weak and falling at home last few days and presented to the emergency room Friday night 09/10/2020.  She was found to have multiple  medical problems plus a minimally displaced fracture of the right proximal humerus surgical neck and greater tuberosity. MRI revealed Small acute infarct in the anterior left paracentral pons, basilar perforator artery  territory. No associated hemorrhage or  mass effect. Chest x-ray revealed Bibasilar opacities, favor atelectasis. Pt has advanced dementia with 24 hour caregivers in place at baseline. Type of Study: Bedside Swallow Evaluation Previous Swallow Assessment: none in chart Diet Prior to this Study: NPO Temperature Spikes Noted: No Respiratory Status: Room air History of Recent Intubation: No Behavior/Cognition: Alert;Pleasant mood;Cooperative;Requires cueing;Doesn't follow directions;Distractible Oral Cavity Assessment: Dry Oral Care Completed by SLP: Recent completion by staff Oral Cavity - Dentition: Adequate natural dentition Self-Feeding Abilities: Total assist Patient Positioning: Upright in bed Baseline Vocal Quality: Low vocal intensity Volitional Cough: Cognitively unable to elicit Volitional Swallow: Unable to elicit    Oral/Motor/Sensory Function Overall Oral Motor/Sensory Function: Within functional limits   Ice Chips Ice chips: Within functional limits Presentation: Spoon   Thin Liquid Thin Liquid: Impaired Presentation: Straw Oral Phase Impairments: Poor awareness of bolus Oral Phase Functional Implications: Oral holding;Prolonged oral transit Pharyngeal  Phase Impairments: Suspected delayed Swallow;Cough - Delayed    Nectar Thick Nectar Thick Liquid: Not tested   Honey Thick Honey Thick Liquid: Not tested   Puree Puree: Impaired Presentation: Spoon Oral Phase Impairments: Poor awareness of bolus;Impaired mastication;Reduced lingual movement/coordination Oral Phase Functional Implications: Oral holding;Prolonged oral transit   Solid     Solid: Not tested     Steele Stracener B. Dreama Saa M.S., CCC-SLP, [facility] Speech-Language Pathologist Rehabilitation Services Office [phone]  Reuel Derby 09/13/2020,12:46 PM

## 2020-09-13 NOTE — Consult Note (Signed)
Consultation Note Date: 09/13/2020   Patient Name: Elizabeth [name]  DOB: 22-Jan-1928  MRN: [id number]  Age / Sex: 87 y.o., female   PCP: Tracie Harrier, MD Referring Physician: Elmarie Shiley, MD   REASON FOR CONSULTATION:Establishing goals of care  Palliative Care consult requested for goals of care discussion in this 87 y.o. female with a medical history significant for coronary artery disease, Alzheimer's dementia, CVA, and hyperlipidemia.  Patient presented to the ED with concerns of acute onset generalized weakness, recurrent falls, and right upper extremity weakness with bruising and swelling x48 hours.  Patient recently seen in the ED on 6/1 for fall with minor head injury and later discharged due to no acute findings.  Chest x-ray showed bibasilar opacities.  Upper extremity showed humerus fracture.  Brain MRI showed small acute infarct in the anterior left paracentral pons.  Chronic small vessel disease.  Patient being followed by neurology.  Clinical Assessment and Goals of Care: I have reviewed medical records including lab results, imaging, Epic notes, and MAR, received report from the bedside RN, and assessed the patient.   I met at the bedside with patient's Sister-in-Law/HCPOA, Elizabeth [name] to discuss diagnosis prognosis, GOC, EOL wishes, disposition and options.  Patient will open eyes on verbal command.  No verbal response.  Will track with eyes.  I introduced Palliative Medicine as specialized medical care for people living with serious illness. It focuses on providing relief from the symptoms and stress of a serious illness. The goal is to improve quality of life for both the patient and the family.   We discussed a brief life review of the patient, along with her functional and nutritional status. Patient is widowed. She had 1 child who unfortunately is deceased. She had 4 siblings who are also deceased. Wells Guiles is her only living relative. 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   Prior to admission patient lived in the home with hired 24/7 caregivers. She required assistance with all ADLs. Appetite was fair. She was ambulatory with walker assistance, but often had falls due not using walker.   We discussed Her current illness and what it means in the larger context of Her on-going co-morbidities. Natural disease trajectory and expectations at EOL were discussed.  Wells Guiles is clear in her understanding of current illness and co-morbidities. She states patient's health has been declining and she knows given recent health decline her quality of life is poor.   A detailed discussion was had today regarding advanced directives.  Concepts specific to code status, artifical feeding and hydration, continued IV antibiotics and rehospitalization. The difference between a aggressive medical intervention and a palliative comfort care path were discussed at length. Wells Guiles is patient's documented HCPOA. She confirms DNR/DNI.   Values and goals of care important to patient and family were attempted to be elicited.   Wells Guiles is clear in expressed wishes for care to focus on Elizabeth [name]'s comfort only. She is hopeful patient will return home with continued caregiver support and spend what time she has left with her.  She confirms wishes for comfort focused care. We discussed at length symptom management and discontinuation of medical interventions and medications not comfort focused. Wells Guiles verbalizes understanding and confirms wishes for comfort.    Wells Guiles shares she has been in Glass blower/designer for outpatient hospice support. She confirms understanding of their goals and philosophy of care. She and her sons are going to  clean up Ms. [name]'s home to allow for equipment delivery with hopes of patient returning home tomorrow.   Questions and concerns were addressed.  The family was encouraged to  call with questions or concerns.  PMT will continue to support holistically as needed.   CODE STATUS: DNR  ADVANCE DIRECTIVES: Primary Decision Maker: Elizabeth [name] (Sister-in-law/HCPOA)   SYMPTOM MANAGEMENT: See below  Palliative Prophylaxis:   Aspiration, Bowel Regimen, Frequent Pain Assessment, Oral Care and Turn Reposition  PSYCHO-SOCIAL/SPIRITUAL:  Support System: Family  Desire for further Chaplaincy support: No  Additional Recommendations (Limitations, Scope, Preferences):  Full Comfort Care  Education on hospice/palliative    PAST MEDICAL HISTORY: Past Medical History:  Diagnosis Date  . CAD (coronary artery disease)    a.  Inferolateral STEMI 09/02/2019  . Dementia ([place])   . History of CVA (cerebrovascular accident)   . Hyperlipidemia LDL goal <70     ALLERGIES:  has No Known Allergies.   MEDICATIONS:  Current Facility-Administered Medications  Medication Dose Route Frequency Provider Last Rate Last Admin  . antiseptic oral rinse (BIOTENE) solution 15 mL  15 mL Topical PRN Pickenpack-Cousar, Ric Rosenberg N, NP      . chlorpheniramine-HYDROcodone (TUSSIONEX) 10-8 MG/5ML suspension 5 mL  5 mL Oral Q12H PRN Mansy, Jan A, MD      . glycopyrrolate (ROBINUL) injection 0.2 mg  0.2 mg Intravenous Q4H PRN Pickenpack-Cousar, Tayt Moyers N, NP      . haloperidol lactate (HALDOL) injection 0.5 mg  0.5 mg Intravenous Q4H PRN Pickenpack-Cousar, Humphrey Guerreiro N, NP      . magnesium hydroxide (MILK OF MAGNESIA) suspension 30 mL  30 mL Oral Daily PRN Mansy, Jan A, MD      . morphine 2 MG/ML injection 2 mg  2 mg Intravenous Q1H PRN Pickenpack-Cousar, Donley Harland N, NP      . ondansetron (ZOFRAN) injection 4 mg  4 mg Intravenous Q6H PRN Mansy, Jan A, MD      . polyvinyl alcohol (LIQUIFILM TEARS) 1.4 % ophthalmic solution 1 drop  1 drop Both Eyes QID PRN Pickenpack-Cousar, Brandley Aldrete N, NP      . sodium chloride flush (NS) 0.9 % injection 3 mL  3 mL Intravenous Once Paulette Blanch, MD        VITAL  SIGNS: BP (!) 167/84 (BP Location: Left Leg)   Pulse 96   Temp 97.9 F (36.6 C) (Oral)   Resp 16   Ht 5' 2"  (1.575 m)   Wt 53.4 kg   SpO2 91%   BMI 21.55 kg/m  Filed Weights   09/10/20 2300  Weight: 53.4 kg    Estimated body mass index is 21.55 kg/m as calculated from the following:   Height as of this encounter: 5' 2"  (1.575 m).   Weight as of this encounter: 53.4 kg.  LABS: CBC:    Component Value Date/Time   WBC 9.9 09/11/2020 0536   HGB 11.0 (L) 09/11/2020 0536   HGB 11.2 01/05/2020 1217   HCT 32.1 (L) 09/11/2020 0536   HCT 35.2 01/05/2020 1217   PLT 166 09/11/2020 0536   PLT 223 01/05/2020 1217   Comprehensive Metabolic Panel:    Component Value Date/Time   NA 136 09/12/2020 1803   NA 145 (H) 05/05/2020 1225   K 2.9 (L) 09/12/2020 1803   BUN 13 09/12/2020 1803   BUN 12 05/05/2020 1225   CREATININE 0.47 09/12/2020 1803   ALBUMIN 3.6 09/10/2020 2306   ALBUMIN 3.6 01/05/2020 1217  Review of Systems  Unable to perform ROS: Dementia   Physical Exam General: NAD, frail chronically-ill appearing Cardiovascular: regular rate and rhythm Pulmonary:diminished bilaterally  Abdomen: soft, nontender, + bowel sounds Extremities: Upper extremity edema, right arm swelling Skin: Scattered ecchymoses, warm and dry Neurological: Easily aroused to verbal stimuli, will track with eyes, no verbal response  Prognosis: Poor- the setting of high risk for aspiration, severe oropharyngeal dysphagia, timers dementia, community-acquired pneumonia, right-sided hemiparesis, right humeral fracture, bedbound, CVA.  Discharge Planning:  Home with Hospice  Recommendations: . DNR/DNI-as confirmed by HCPOA . Transition of care to focus on comfort.  Discontinue all medical interventions/medications not comfort focused. Wells Guiles (POA) realistic in her understanding of patient's current illness and comorbidities.  Is clear and expressed wishes for our care to focus on her comfort and  symptom management with a goal of returning home with hired caregivers and outpatient hospice support to spend what time she has lived in her own home. . TOC/[facility] hospice aware of home requests and is collaborating with family. Morphine PRN for pain/air hunger/comfort Robinul PRN for excessive secretions Zofran PRN for nausea Liquifilm tears PRN for dry eyes Haldol PRN for agitation/anxiety May have comfort feeding Comfort cart for family Unrestricted visitations in the setting of EOL (per policy) Oxygen PRN 2L or less for comfort. No escalation.  Marland Kitchen PMT will continue to support and follow as needed. Please call team line with urgent needs.   Palliative Performance Scale: PPS 10%              Sister-in-law, Wells Guiles expressed understanding and was in agreement with this plan.   Thank you for allowing the Palliative Medicine Team to assist in the care of this patient. Please utilize secure chat with additional questions, if there is no response within 30 minutes please call the above phone number.   Time In: 1145 Time Out: 1240 Time Total: 55 min.   Visit consisted of counseling and education dealing with the complex and emotionally intense issues of symptom management and palliative care in the setting of serious and potentially life-threatening illness.Greater than 50%  of this time was spent counseling and coordinating care related to the above assessment and plan.  Signed by:  Alda Lea, AGPCNP-BC Palliative Medicine Team  Phone: [phone] Pager: [phone] Amion: [place] Team providers are available by phone from 7am to 7pm daily and can be reached through the team cell phone.  Should this patient require assistance outside of these hours, please call the patient's attending physician.

## 2020-09-13 NOTE — TOC Initial Note (Signed)
Transition of Care [facility]) - Initial/Assessment Note    Patient Details  Name: Elizabeth [name] MRN: [id number] Date of Birth: 17-Dec-1927  Transition of [facility]) CM/SW Contact:    Shelbie Hutching, RN Phone Number: 09/13/2020, 3:50 PM  Clinical Narrative:                 Patient admitted to the hospital after fall and arm fracture, patient found to have small stroke.  RNCM met with patient and patient's POA her sister in law, [place], at the bedside.  Jacqlyn Larsen has decided on comfort measures and home with hospice care at discharge.  Becky chooses [facility], Flo Shanks given referral.  Patient needs a hospital bed and over the bed table.  Patient has 24/7 hired caregivers, company out of [employer].  Plan for discharge tomorrow.  Patient will need EMS transport.   Expected Discharge Plan: Home w Hospice Care Barriers to Discharge: Equipment Delay   Patient Goals and CMS Choice Patient states their goals for this hospitalization and ongoing recovery are:: sister in law and POA chooses home with hospice CMS Medicare.gov Compare Post Acute Care list provided to:: Patient Represenative (must comment) Choice offered to / list presented to : [place] / Guardian  Expected Discharge Plan and Services Expected Discharge Plan: [place] In-house Referral: Hospice / Palliative Care Discharge Planning Services: CM Consult Post Acute Care Choice: Hospice Living arrangements for the past 2 months: [place]                 DME Arranged: Hospital bed DME Agency: [facility]) Date DME Agency Contacted: 09/13/20 Time DME Agency Contacted: 1100 Representative spoke with at DME Agency: Flo Shanks [facility] Arranged: NA          Prior Living Arrangements/Services Living arrangements for the past 2 months: Single Family Home Lives with:: Self Patient language and need for interpreter reviewed:: Yes Do you feel safe going  back to the place where you live?: Yes      Need for Family Participation in Patient Care: Yes (Comment) (stroke) Care giver support system in place?: Yes (comment) (sister in law) Current home services: [facility] Criminal Activity/Legal Involvement Pertinent to Current Situation/Hospitalization: No - Comment as needed  Activities of Daily Living Home Assistive Devices/Equipment: Environmental consultant (specify type) ADL Screening (condition at time of admission) Patient's cognitive ability adequate to safely complete daily activities?: No Is the patient deaf or have difficulty hearing?: No Does the patient have difficulty seeing, even when wearing glasses/contacts?: No Does the patient have difficulty concentrating, remembering, or making decisions?: Yes Patient able to express need for assistance with ADLs?: No Does the patient have difficulty dressing or bathing?: Yes Independently performs ADLs?: No Communication: Independent Dressing (OT): Needs assistance Is this a change from baseline?: Pre-admission baseline Grooming: Needs assistance Is this a change from baseline?: Pre-admission baseline Feeding: Needs assistance Is this a change from baseline?: Pre-admission baseline Bathing: Needs assistance Is this a change from baseline?: Pre-admission baseline Toileting: Needs assistance Is this a change from baseline?: Pre-admission baseline In/Out Bed: Needs assistance Is this a change from baseline?: Pre-admission baseline Walks in Home: Independent,Needs assistance Is this a change from baseline?: Pre-admission baseline Does the patient have difficulty walking or climbing stairs?: Yes Weakness of Legs: Right  Permission Sought/Granted Permission sought to share information with : [place] granted to share information with : Yes, Verbal Permission Granted  Share Information with NAME: Jacqlyn Larsen  Permission granted to share info w AGENCY:  [place] granted to share info w Relationship: sister in law     Emotional Assessment Appearance:: Appears stated age Attitude/Demeanor/Rapport: Unable to Assess Affect (typically observed): Unable to Assess Orientation: : Oriented to Self Alcohol / Substance Use: Not Applicable Psych Involvement: No (comment)  Admission diagnosis:  Weakness [R53.1] CVA (cerebral vascular accident) ([place]) [I63.9] Hypoxia [R09.02] Elevated troponin [R77.8] CAP (community acquired pneumonia) [J18.9] Fall, initial encounter [W19.XXXA] Altered mental status, unspecified altered mental status type [R41.82] Cerebrovascular accident (CVA), unspecified mechanism ([place]) [I63.9] Closed displaced fracture of surgical neck of right humerus, unspecified fracture morphology, initial encounter [S42.211A] Community acquired pneumonia, unspecified laterality [J18.9] Sepsis, due to unspecified organism, unspecified whether acute organ dysfunction present [facility]) [A41.9] Patient Active Problem List   Diagnosis Date Noted  . CAP (community acquired pneumonia) 09/11/2020  . Coronary artery disease involving native coronary artery of native heart without angina pectoris 05/06/2020  . Hyperlipidemia LDL goal <70 05/06/2020  . Hypokalemia 05/06/2020  . Protein-calorie malnutrition, severe 09/08/2019  . STEMI involving right coronary artery ([place]) 09/03/2019  . STEMI (ST elevation myocardial infarction) ([place]) 09/02/2019   PCP:  Tracie Harrier, MD Pharmacy:   [facility], [place] - [address] [place] [address] Phone: [phone] Fax: [phone]     Social Determinants of Health (SDOH) Interventions    Readmission Risk Interventions No flowsheet data found.

## 2020-09-13 NOTE — Progress Notes (Signed)
[facility] Room 117  [facility]) [facility] Liaison Note  Received request from Robbie Lis, RN [facility] manager for hospice services at home after discharge. Chart and patient information reviewed by [facility] physician. Hospice eligibility has been confirmed.   Spoke with, Kriste Basque, patient's sister in law to initiate education related to hospice philosophy, services and team approach to care. Becky verbalized understanding of information given. Per discussion, the plan is for discharge home by EMS on 09/14/20.   DME needs discussed. Per Kriste Basque, the patient will need a hospital bed with rails and an over the bed table. Address has been verified and is correct in the chart. Juel Burrow [phone] is the family contact to arrange time of equipment delivery.   Please send signed and completed DNR home with patient/family. Please provide prescriptions at discharge as needed to ensure ongoing symptom management.   [facility] information and contact numbers given to [facility]. Above information shared with Robbie Lis, RN. Please call with any questions or concerns.   Thank you for the opportunity to participate in this patient's care.   Bobbie "Einar Gip, RN, BSN [facility] Liaison [phone]

## 2020-09-14 DIAGNOSIS — R778 Other specified abnormalities of plasma proteins: Secondary | ICD-10-CM

## 2020-09-14 DIAGNOSIS — R0902 Hypoxemia: Secondary | ICD-10-CM

## 2020-09-14 DIAGNOSIS — S42211A Unspecified displaced fracture of surgical neck of right humerus, initial encounter for closed fracture: Secondary | ICD-10-CM

## 2020-09-14 DIAGNOSIS — I639 Cerebral infarction, unspecified: Secondary | ICD-10-CM

## 2020-09-14 DIAGNOSIS — W19XXXA Unspecified fall, initial encounter: Secondary | ICD-10-CM

## 2020-09-14 DIAGNOSIS — R4182 Altered mental status, unspecified: Secondary | ICD-10-CM

## 2020-09-14 DIAGNOSIS — R531 Weakness: Secondary | ICD-10-CM

## 2020-09-14 DIAGNOSIS — R7989 Other specified abnormal findings of blood chemistry: Secondary | ICD-10-CM

## 2020-09-14 LAB — CULTURE, BLOOD (ROUTINE X 2)

## 2020-09-14 MED ORDER — LORAZEPAM 0.5 MG PO TABS: 0.5000 mg | ORAL_TABLET | Freq: Four times a day (QID) | ORAL | 0 refills | Status: AC | PRN

## 2020-09-14 MED ORDER — BIOTENE DRY MOUTH MT LIQD: 15.0000 mL | OROMUCOSAL | 0 refills | Status: AC | PRN

## 2020-09-14 MED ORDER — HYDROCOD POLST-CPM POLST ER 10-8 MG/5ML PO SUER: 5.0000 mL | Freq: Two times a day (BID) | ORAL | 0 refills | Status: AC | PRN

## 2020-09-14 MED ORDER — MORPHINE SULFATE 20 MG/5ML PO SOLN: 2.5000 mg | ORAL | 0 refills | Status: AC | PRN

## 2020-09-14 NOTE — Progress Notes (Signed)
Butch Penny to be D/C'd  per MD order. Discussed with the legal guardian and all questions fully answered.  VSS, Skin clean, dry and intact without evidence of skin break down, no evidence of skin tears noted.  IV catheter discontinued intact. Site without signs and symptoms of complications. Dressing and pressure applied.  An After Visit Summary was printed and given to the patient. Patient received prescription.  D/c education completed with legal guardian including follow up instructions, medication list, d/c activities limitations if indicated, with other d/c instructions as indicated by MD - able to verbalize understanding, all questions fully answered.   Patient instructed to return to ED, call 911, or call MD for any changes in condition.   Patient to be escorted via stretcher, and D/C home [facility].

## 2020-09-14 NOTE — Progress Notes (Signed)
Civil engineer, contracting ([facility])  DME is set to be delivered around noon today.  Spoke with Kriste Basque, Ms. Tomkiewicz sister-in-law and she confirmed DME delivery plan for noon.  Please update Becky with an estimated discharge time so she will be sure to be at home once she is discharged.  Hospice will follow up in the home after discharge.  Wallis Bamberg RN, BSN, CCRN [facility] Liaison

## 2020-09-14 NOTE — TOC Transition Note (Signed)
Transition of Care [facility]) - CM/SW Discharge Note   Patient Details  Name: Elizabeth [name] MRN: [id number] Date of Birth: December 29, 1927  Transition of Care [facility]) CM/SW Contact:  Allayne Butcher, RN Phone Number: 09/14/2020, 12:10 PM   Clinical Narrative:    Patient to be discharged home with [facility] today.  Hospital bed to be delivered around 1:30 pm.  RNCM has arranged transport with [facility] for between 2:30-3pm.  Kriste Basque is aware of discharge plan and updated on transport for this afternoon.     Final next level of care: Home w Hospice Care Barriers to Discharge: Barriers Resolved   Patient Goals and CMS Choice Patient states their goals for this hospitalization and ongoing recovery are:: sister in law and POA chooses home with hospice CMS Medicare.gov Compare Post Acute Care list provided to:: Patient Represenative (must comment) Choice offered to / list presented to : [facility] POA / Guardian  Discharge Placement                Patient to be transferred to facility by: Transported home by [facility] Name of family member notified: Becky Patient and family notified of of transfer: 09/14/20  Discharge Plan and Services In-house Referral: Hospice / Palliative Care Discharge Planning Services: CM Consult Post Acute Care Choice: Hospice          DME Arranged: Hospital bed [facility]ME Agency[facility]) Date DME Agency Contacted: 09/13/20 Time DME Agency Contacted: 1100 Representative spoke with at DME Agency: Dayna Barker [facility] Arranged: NA          Social Determinants of Health (SDOH) Interventions     Readmission Risk Interventions No flowsheet data found.

## 2020-09-14 NOTE — Progress Notes (Signed)
   Daily Progress Note   Patient Name: Elizabeth [name]       Date: 09/14/2020 DOB: 1927-08-17  Age: 87 y.o. MRN#: [id number] Attending Physician: Alba Cory, MD Primary Care Physician: Barbette Reichmann, MD Admit Date: 09/10/2020  Reason for Consultation/Follow-up: Establishing goals of care  Subjective: Chart Reviewed. Updates Received. Patient Assessed.   Patient nonverbal. No acute distress. Opens eyes.   No family at the bedside. Spoke with Lurena Joiner and provided updates. She confirms plans for patient to discharge home with hospice support today. She is at the home waiting for equipment delivery, however delay with delivery. She is requesting patient discharge be pushed back to later today.   Education provided on transfer home and continued hospice support. Sister-in-law verbalizes understanding and appreciation.   All questions answered support provided.   Length of Stay: 3 days  Vital Signs: BP (!) 150/77 (BP Location: Left Arm)   Pulse 83   Temp 99 F (37.2 C)   Resp 16   Ht 5\' 2"  (1.575 m)   Wt 53.4 kg   SpO2 100%   BMI 21.55 kg/m  SpO2: SpO2: 100 % O2 Device: O2 Device: Nasal Cannula O2 Flow Rate: O2 Flow Rate (L/min): 2 L/min  Physical Exam: NAD, frail, ill-appearing RRR Diminished bilaterally nonverbal             Palliative Care Assessment & Plan  HPI: Palliative Care consult requested for goals of care discussion in this 87 y.o. female with a medical history significant for coronary artery disease, Alzheimer's dementia, CVA, and hyperlipidemia.  Patient presented to the ED with concerns of acute onset generalized weakness, recurrent falls, and right upper extremity weakness with bruising and swelling x48 hours.  Patient recently seen in the ED on 6/1 for fall with minor head injury and later discharged due to no acute findings.  Chest x-ray showed bibasilar opacities.  Upper extremity showed humerus fracture.  Brain MRI showed small acute infarct in  the anterior left paracentral pons.  Chronic small vessel disease.  Patient being followed by neurology.  Code Status: DNR  Goals of Care/Recommendations: Continue comfort care Plans to discharge later today with [facility] hospice for ongoing end-of-life care. Pending equipment delivery  Prognosis:POOR  Discharge Planning: Home with Hospice  Thank you for allowing the Palliative Medicine Team to assist in the care of this patient.  Time Total: 35 min.   Visit consisted of counseling and education dealing with the complex and emotionally intense issues of symptom management and palliative care in the setting of serious and potentially life-threatening illness.Greater than 50%  of this time was spent counseling and coordinating care related to the above assessment and plan.  8/1, AGPCNP-BC  Palliative Medicine Team [phone]

## 2020-09-14 NOTE — Discharge Summary (Addendum)
Physician Discharge Summary  Elizabeth [name] ZOX:[id number] DOB: 04-16-1927 DOA: 09/10/2020  PCP: Barbette Reichmann, MD  Admit date: 09/10/2020 Discharge date: 09/14/2020  Admitted From: Home  Disposition:  Home with Hospice.   Recommendations for Outpatient Follow-up:  Comfort care.      Discharge Condition: Stable.  CODE STATUS: DNR Diet recommendation: Dysphagia 1 thin liquid  Brief/Interim Summary: 87 year old with past medical history significant for Alzheimer's dementia, dyslipidemia, CAD who presents with new onset of generalized weakness and recurrent fall as well as right upper extremity weakness with bruising and swelling which has been going on over the last 48 hours.   Evaluation in the ED patient was found to be hypoxic oxygen sat of 90 on room air placed on 2 L of oxygen.  BNP mildly elevated 747, troponin was 224, UA unremarkable.     1-Community-Acquired Pneumonia: Acute hypoxic respiratory failure: Presented with lactic acidosis, she received IV fluids and IV antibiotics. He is at risk for aspiration.  Discussed with POA poor prognosis and risk of recurrent infection.  Family would  like to proceed with palliative care or hospice care at home. Plan to discharge home with hospice today/.    Acute stroke Patient presented with right side hemiparesis, difficulty swallowing. MRI: Small acute infarct in the anterior left paracentral pons, basilar perforator artery territory.  No associated hemorrhage or mass-effect. MRA; severely stenotic disease within the posterior cerebral artery. Received aspirin  and Plavix during this hospitalization.  Subsequently discontinue, patient has been transition to hospice care.    Elevation of troponin 224---159. Demand ischemia.  No wall motion abnormality. Further evaluation, plan for hospice care.   Blood culture positive for staph epidermidis: one out of two;  Likely a contaminant. To transition to comfort care     Right  humeral neck fracture Poorly visualized displaced fracture of the greater tuberosity and humeral neck. She is not a candidate for surgery, continue with sling She will be discharge on Morphine PRN for pain.   Hypokalemia: Received IV potassium supplementation.   Sepsis was ruled out     Discharge Diagnoses:  Active Problems:   Community acquired pneumonia   Cerebrovascular accident (CVA) (HCC)   Elevated troponin   Altered mental status   Hypoxia   Closed displaced fracture of surgical neck of right humerus   Weakness   Fall    Discharge Instructions  Discharge Instructions     Diet - low sodium heart healthy   Complete by: As directed    Increase activity slowly   Complete by: As directed       Allergies as of 09/14/2020   No Known Allergies      Medication List     STOP taking these medications    aspirin 81 MG chewable tablet   B-12 PO   clopidogrel 75 MG tablet Commonly known as: PLAVIX   nitroGLYCERIN 0.4 MG SL tablet Commonly known as: NITROSTAT   rosuvastatin 20 MG tablet Commonly known as: CRESTOR       TAKE these medications    antiseptic oral rinse Liqd Apply 15 mLs topically as needed for dry mouth.   chlorpheniramine-HYDROcodone 10-8 MG/5ML Suer Commonly known as: TUSSIONEX Take 5 mLs by mouth every 12 (twelve) hours as needed for cough.   LORazepam 0.5 MG tablet Commonly known as: Ativan Take 1 tablet (0.5 mg total) by mouth every 6 (six) hours as needed for up to 10 days for anxiety.   morphine 20 MG/5ML solution Take 0.6  mLs (2.4 mg total) by mouth every 3 (three) hours as needed for pain.               Durable Medical Equipment  (From admission, onward)           Start     Ordered   09/13/20 1000  For home use only DME Hospital bed  Once       Question Answer Comment  Length of Need 6 Months   The above medical condition requires: Patient requires the ability to reposition frequently   Head must be elevated  greater than: 45 degrees   Bed type Semi-electric      09/13/20 0959            No Known Allergies  Consultations: Ortho  Palliative Care   Procedures/Studies: DG Shoulder Right  Result Date: 09/12/2020 CLINICAL DATA:  Known right humeral head fracture. EXAM: RIGHT SHOULDER - 2+ VIEW COMPARISON:  September 11, 2020. FINDINGS: Unchanged alignment of the displaced fracture of the greater tuberosity and humeral neck. Inferior displacement of the humeral head in relation to the glenoid, likely sequela of a joint effusion. Visualized lung fields are clear. IMPRESSION: Unchanged alignment of the displaced fracture of the greater tuberosity and humeral neck. Electronically Signed   By: Maudry Mayhew MD   On: 09/12/2020 11:40   CT HEAD WO CONTRAST  Result Date: 09/10/2020 CLINICAL DATA:  Fall and hyperglycemia EXAM: CT HEAD WITHOUT CONTRAST TECHNIQUE: Contiguous axial images were obtained from the base of the skull through the vertex without intravenous contrast. COMPARISON:  09/08/2020 FINDINGS: Brain: There is no mass, hemorrhage or extra-axial collection. There is generalized atrophy without lobar predilection. Hypodensity of the white matter is most commonly associated with chronic microvascular disease. Vascular: No abnormal hyperdensity of the major intracranial arteries or dural venous sinuses. No intracranial atherosclerosis. Skull: The visualized skull base, calvarium and extracranial soft tissues are normal. Sinuses/Orbits: No fluid levels or advanced mucosal thickening of the visualized paranasal sinuses. No mastoid or middle ear effusion. The orbits are normal. IMPRESSION: Generalized atrophy and chronic microvascular ischemia without acute intracranial abnormality. Electronically Signed   By: Deatra Robinson M.D.   On: 09/10/2020 23:45   CT Head Wo Contrast  Result Date: 09/08/2020 CLINICAL DATA:  Fall. EXAM: CT HEAD WITHOUT CONTRAST CT CERVICAL SPINE WITHOUT CONTRAST TECHNIQUE: Multidetector  CT imaging of the head and cervical spine was performed following the standard protocol without intravenous contrast. Multiplanar CT image reconstructions of the cervical spine were also generated. COMPARISON:  None. FINDINGS: CT HEAD FINDINGS Brain: Mild chronic ischemic white matter disease. No mass effect or midline shift is noted. Ventricular size is within normal limits. There is no evidence of mass lesion, hemorrhage or acute infarction. Vascular: No hyperdense vessel or unexpected calcification. Skull: Normal. Negative for fracture or focal lesion. Sinuses/Orbits: No acute finding. Other: None. CT CERVICAL SPINE FINDINGS Alignment: Mild grade 1 anterolisthesis of C7-T1 is noted secondary to posterior facet joint hypertrophy. Skull base and vertebrae: No acute fracture. No primary bone lesion or focal pathologic process. Soft tissues and spinal canal: No prevertebral fluid or swelling. No visible canal hematoma. Disc levels: Moderate to severe degenerative disc disease is noted at C3-4, C4-5, C5-6 and C6-7. Upper chest: Negative. Other: None. IMPRESSION: No acute intracranial abnormality seen. Multilevel degenerative disc disease. No acute abnormality is noted in the cervical spine. Electronically Signed   By: Lupita Raider M.D.   On: 09/08/2020 15:20   CT Cervical  Spine Wo Contrast  Result Date: 09/08/2020 CLINICAL DATA:  Fall. EXAM: CT HEAD WITHOUT CONTRAST CT CERVICAL SPINE WITHOUT CONTRAST TECHNIQUE: Multidetector CT imaging of the head and cervical spine was performed following the standard protocol without intravenous contrast. Multiplanar CT image reconstructions of the cervical spine were also generated. COMPARISON:  None. FINDINGS: CT HEAD FINDINGS Brain: Mild chronic ischemic white matter disease. No mass effect or midline shift is noted. Ventricular size is within normal limits. There is no evidence of mass lesion, hemorrhage or acute infarction. Vascular: No hyperdense vessel or unexpected  calcification. Skull: Normal. Negative for fracture or focal lesion. Sinuses/Orbits: No acute finding. Other: None. CT CERVICAL SPINE FINDINGS Alignment: Mild grade 1 anterolisthesis of C7-T1 is noted secondary to posterior facet joint hypertrophy. Skull base and vertebrae: No acute fracture. No primary bone lesion or focal pathologic process. Soft tissues and spinal canal: No prevertebral fluid or swelling. No visible canal hematoma. Disc levels: Moderate to severe degenerative disc disease is noted at C3-4, C4-5, C5-6 and C6-7. Upper chest: Negative. Other: None. IMPRESSION: No acute intracranial abnormality seen. Multilevel degenerative disc disease. No acute abnormality is noted in the cervical spine. Electronically Signed   By: Lupita RaiderJames  Green Jr M.D.   On: 09/08/2020 15:20   MR ANGIO HEAD WO CONTRAST  Result Date: 09/11/2020 CLINICAL DATA:  Right extremity weakness with recurrent falls. Acute infarction left pons EXAM: MRA HEAD WITHOUT CONTRAST TECHNIQUE: Angiographic images of the Circle of Willis were acquired using MRA technique without intravenous contrast. COMPARISON:  MRI same day. FINDINGS: Anterior circulation: Both internal carotid arteries widely patent through the skull base and siphon regions. Anterior and middle cerebral vessels show flow. Moderate atherosclerotic irregularity of the more distal branch vessels. Posterior circulation: Both vertebral arteries widely patent to the basilar. Artifactual signal loss in both distal vertebral artery V4 segments. No basilar stenosis. Posterior circulation branch vessels show flow. Flow present in both superior cerebellar and posterior cerebral arteries. There is severe stenotic disease within both P1 P2 junction regions. Anatomic variants: None significant Other: None. IMPRESSION: Both vertebral arteries appear widely patent to the basilar. No basilar stenosis. Severe stenotic disease within the posterior cerebral arteries, particularly at the P1 P2  junction regions. Electronically Signed   By: Paulina FusiMark  Shogry M.D.   On: 09/11/2020 13:56   MR BRAIN WO CONTRAST  Result Date: 09/11/2020 CLINICAL DATA:  87 year old female with right extremity weakness. Generalized weakness and recurrent falls. EXAM: MRI HEAD WITHOUT CONTRAST TECHNIQUE: Multiplanar, multiecho pulse sequences of the brain and surrounding structures were obtained without intravenous contrast. COMPARISON:  Brain MRI 09/03/2019.  Head CT 09/10/2020. FINDINGS: Brain: Small 4-5 mm focus of restricted diffusion in the left central pons on series 5, image 12 (series 7, image 20 and series 8, image 20). Faint T2 and FLAIR hyperintensity. No hemorrhage or mass effect. No other restricted diffusion. No midline shift, mass effect, evidence of mass lesion, ventriculomegaly, extra-axial collection or acute intracranial hemorrhage. Cervicomedullary junction and pituitary are within normal limits. 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 Patchy and confluent T2 and FLAIR heterogeneity in the bilateral cerebral white matter and deep gray matter nuclei is stable. Small focus of cystic encephalomalacia in the right centrum semiovale is stable compatible with chronic white matter lacune. No chronic cerebral blood products or cortical encephalomalacia identified. Vascular: Major intracranial vascular flow voids are stable since last year. Skull and upper cervical spine: Negative for age. Sinuses/Orbits: Stable and negative. Other: Mastoids remain clear. Visible internal  auditory structures appear normal. Negative visible scalp and face. IMPRESSION: 1. Small acute infarct in the anterior left paracentral pons, basilar perforator artery territory. No associated hemorrhage or mass effect. 2. Otherwise stable MRI appearance of chronic small vessel disease since last year. Electronically Signed   By: Odessa Fleming M.D.   On: 09/11/2020 07:51   US Carotid Bilateral (at [facility] and [facility] only)  Result Date:  09/11/2020 CLINICAL DATA:  87 year old female with recent neurologic deficit. EXAM: BILATERAL CAROTID DUPLEX ULTRASOUND TECHNIQUE: Wallace Cullens scale imaging, color Doppler and duplex ultrasound were performed of bilateral carotid and vertebral arteries in the neck. COMPARISON:  Head CT 09/10/2020. FINDINGS: Criteria: Quantification of carotid stenosis is based on velocity parameters that correlate the residual internal carotid diameter with NASCET-based stenosis levels, using the diameter of the distal internal carotid lumen as the denominator for stenosis measurement. The following velocity measurements were obtained: RIGHT ICA: 87 cm/sec CCA: 56 cm/sec SYSTOLIC ICA/CCA RATIO:  1.5 ECA: 73 cm/sec LEFT ICA: 109 cm/sec CCA: 72 cm/sec SYSTOLIC ICA/CCA RATIO:  1.5 ECA: 126 cm/sec RIGHT CAROTID ARTERY: Atherosclerosis at the right carotid bifurcation and right ICA origin, proximal bulb. RIGHT VERTEBRAL ARTERY:  Patent, antegrade flow. LEFT CAROTID ARTERY: Atherosclerosis visible at the left carotid bifurcation and proximal bulb. LEFT VERTEBRAL ARTERY:  Patent, antegrade flow. IMPRESSION: Bilateral carotid bifurcation atherosclerosis but Doppler criteria indicate less than 50% stenosis. Electronically Signed   By: Odessa Fleming M.D.   On: 09/11/2020 06:35   DG Chest Port 1 View  Result Date: 09/10/2020 CLINICAL DATA:  Weakness EXAM: PORTABLE CHEST 1 VIEW COMPARISON:  06/26/2017 FINDINGS: Bibasilar opacities, likely atelectasis. Heart is borderline in size. No effusions or acute bony abnormality. IMPRESSION: Bibasilar opacities, favor atelectasis. Electronically Signed   By: Charlett Nose M.D.   On: 09/10/2020 23:27   DG Humerus Right  Result Date: 09/11/2020 CLINICAL DATA:  Bruising/deformity of right upper extremity. Status post fall. Right arm weakness. EXAM: RIGHT HUMERUS - 2+ VIEW COMPARISON:  None. FINDINGS: Displaced fracture of the greater tuberosity and humeral neck. Alignment of the glenohumeral joint not well  visualized. Associated overlying subcutaneus soft tissue edema. IMPRESSION: 1. Poorly visualized displaced fracture of the greater tuberosity and humeral neck. 2. Alignment of the glenohumeral joint not well visualized. Electronically Signed   By: Tish Frederickson M.D.   On: 09/11/2020 02:46   ECHOCARDIOGRAM COMPLETE BUBBLE STUDY  Result Date: 09/12/2020    ECHOCARDIOGRAM REPORT   Patient Name:   Butch Penny Date of Exam: 09/12/2020 Medical Rec #:  [id number]             Height:       62.0 in Accession #:    [id number]            Weight:       117.8 lb Date of Birth:  April 26, 1927              BSA:          1.527 m Patient Age:    89 years              BP:           141/85 mmHg Patient Gender: F                     HR:           103 bpm. Exam Location:  [facility] Procedure: 2D Echo and Saline Contrast Bubble Study Indications:  Stroke  History:         Patient has prior history of Echocardiogram examinations.                  Previous Myocardial Infarction and CAD; Stroke.  Sonographer:     L Thornton-Maynard Referring Phys:  [id number] Vernetta Honey MANSY Diagnosing Phys: Yvonne Kendall MD  Sonographer Comments: Image acquisition challenging due to breast implants. IMPRESSIONS  1. Left ventricular ejection fraction, by estimation, is 65 to 70%. The left ventricle has normal function. The left ventricle has no regional wall motion abnormalities. Left ventricular diastolic parameters are consistent with Grade I diastolic dysfunction (impaired relaxation).  2. Right ventricular systolic function is mildly reduced. The right ventricular size is mildly enlarged.  3. Left atrial size was mildly dilated.  4. The mitral valve is abnormal. Trivial mitral valve regurgitation. No evidence of mitral stenosis.  5. Tricuspid valve regurgitation is moderate to severe.  6. The aortic valve is tricuspid. There is mild thickening of the aortic valve. Aortic valve regurgitation is not visualized. Mild aortic valve sclerosis is present,  with no evidence of aortic valve stenosis.  7. Bubble study is nondiagnostic for intracardiac shunting. FINDINGS  Left Ventricle: Left ventricular ejection fraction, by estimation, is 65 to 70%. The left ventricle has normal function. The left ventricle has no regional wall motion abnormalities. The left ventricular internal cavity size was normal in size. There is  borderline left ventricular hypertrophy. Left ventricular diastolic parameters are consistent with Grade I diastolic dysfunction (impaired relaxation). Right Ventricle: Pulmonary artery pressure is moderately to severely elevated (PASP 55-60 mmHg plus central venous pressure). The right ventricular size is mildly enlarged. No increase in right ventricular wall thickness. Right ventricular systolic function is mildly reduced. Left Atrium: Left atrial size was mildly dilated. Right Atrium: Right atrial size was normal in size. Pericardium: There is no evidence of pericardial effusion. Mitral Valve: The mitral valve is abnormal. Mild mitral annular calcification. Trivial mitral valve regurgitation. No evidence of mitral valve stenosis. Tricuspid Valve: The tricuspid valve is normal in structure. Tricuspid valve regurgitation is moderate to severe. Aortic Valve: The aortic valve is tricuspid. There is mild thickening of the aortic valve. Aortic valve regurgitation is not visualized. Mild aortic valve sclerosis is present, with no evidence of aortic valve stenosis. Aortic valve peak gradient measures 10.8 mmHg. Pulmonic Valve: The pulmonic valve was grossly normal. Pulmonic valve regurgitation is trivial. No evidence of pulmonic stenosis. Aorta: The aortic root is normal in size and structure. Pulmonary Artery: The pulmonary artery is not well seen. Venous: The inferior vena cava was not well visualized. IAS/Shunts: The interatrial septum was not well visualized. Agitated saline contrast was given intravenously to evaluate for intracardiac shunting. Bubble  study is nondiagnostic for intracardiac shunting.  LEFT VENTRICLE PLAX 2D LVIDd:         3.40 cm  Diastology LVIDs:         2.43 cm  LV e' medial:    7.29 cm/s LV PW:         0.95 cm  LV E/e' medial:  8.6 LV IVS:        1.07 cm  LV e' lateral:   5.66 cm/s LVOT diam:     1.80 cm  LV E/e' lateral: 11.1 LV SV:         52 LV SV Index:   34 LVOT Area:     2.54 cm  RIGHT VENTRICLE RV S prime:  15.70 cm/s TAPSE (M-mode): 1.7 cm LEFT ATRIUM             Index       RIGHT ATRIUM           Index LA diam:        2.70 cm 1.77 cm/m  RA Area:     14.64 cm 9.59 cm/m LA Vol (A2C):   48.5 ml 31.77 ml/m LA Vol (A4C):   71.1 ml 46.57 ml/m LA Biplane Vol: 58.9 ml 38.58 ml/m  AORTIC VALVE                PULMONIC VALVE AV Area (Vmax): 1.60 cm    PV Vmax:       1.03 m/s AV Vmax:        164.00 cm/s PV Peak grad:  4.2 mmHg AV Peak Grad:   10.8 mmHg LVOT Vmax:      103.00 cm/s LVOT Vmean:     77.500 cm/s LVOT VTI:       0.206 m  AORTA Ao Root diam: 3.10 cm MITRAL VALVE               TRICUSPID VALVE MV Area (PHT): 3.33 cm    TR Peak grad:   58.7 mmHg MV E velocity: 63.00 cm/s  TR Vmax:        383.00 cm/s MV A velocity: 84.80 cm/s MV E/A ratio:  0.74        SHUNTS                            Systemic VTI:  0.21 m                            Systemic Diam: 1.80 cm Christopher End MDYvonne Kendalllly signed by Yvonne Kendall MD Signature Date/Time: 09/12/2020/6:33:40 PM    Final     Subjective: Alert, report mild shoulder pain.   Discharge Exam: Vitals:   09/14/20 0550 09/14/20 0839  BP: (!) 146/90 (!) 150/77  Pulse: 72 83  Resp: 16 16  Temp: 97.7 F (36.5 C) 99 F (37.2 C)  SpO2: 100% 100%     General: Pt is alert, awake, not in acute distress Cardiovascular: RRR, S1/S2 +, no rubs, no gallops Respiratory: CTA bilaterally, no wheezing, no rhonchi Abdominal: Soft, NT, ND, bowel sounds + Extremities: no edema, no cyanosis    The results of significant diagnostics from this hospitalization (including imaging,  microbiology, ancillary and laboratory) are listed below for reference.     Microbiology: Recent Results (from the past 240 hour(s))  Culture, blood (routine x 2)     Status: None (Preliminary result)   Collection Time: 09/10/20 11:05 PM   Specimen: BLOOD  Result Value Ref Range Status   Specimen Description BLOOD BLOOD RIGHT FOREARM  Final   Special Requests   Final    BOTTLES DRAWN AEROBIC AND ANAEROBIC Blood Culture results may not be optimal due to an inadequate volume of blood received in culture bottles   Culture   Final    NO GROWTH 4 DAYS Performed at [facility], [address] [place], [place] [address]    Report Status PENDING  Incomplete  Urine culture     Status: Abnormal   Collection Time: 09/10/20 11:23 PM   Specimen: Urine, Random  Result Value Ref Range Status   Specimen Description   Final  URINE, RANDOM Performed at [facility], [address] [place], [place] [address]    Special Requests   Final    NONE Performed at [facility], [address] [place], [place] [address]    Culture MULTIPLE SPECIES PRESENT, SUGGEST RECOLLECTION (A)  Final   Report Status 09/12/2020 FINAL  Final  Resp Panel by RT-PCR (Flu A&B, Covid) Nasopharyngeal Swab     Status: None   Collection Time: 09/10/20 11:23 PM   Specimen: Nasopharyngeal Swab; Nasopharyngeal(NP) swabs in vial transport medium  Result Value Ref Range Status   SARS Coronavirus 2 by RT PCR NEGATIVE NEGATIVE Final    Comment: (NOTE) SARS-CoV-2 target nucleic acids are NOT DETECTED.  The SARS-CoV-2 RNA is generally detectable in upper respiratory specimens during the acute phase of infection. The lowest concentration of SARS-CoV-2 viral copies this assay can detect is 138 copies/mL. A negative result does not preclude SARS-Cov-2 infection and should not be used as the sole basis for treatment or other patient management decisions. A negative result may occur with  improper  specimen collection/handling, submission of specimen other than nasopharyngeal swab, presence of viral mutation(s) within the areas targeted by this assay, and inadequate number of viral copies(<138 copies/mL). A negative result must be combined with clinical observations, patient history, and epidemiological information. The expected result is Negative.  Fact Sheet for Patients:  BloggerCourse.com  Fact Sheet for Healthcare Providers:  SeriousBroker.it  This test is no t yet approved or cleared by the [place] FDA and  has been authorized for detection and/or diagnosis of SARS-CoV-2 by FDA under an Emergency Use Authorization (EUA). This EUA will remain  in effect (meaning this test can be used) for the duration of the COVID-19 declaration under Section 564(b)(1) of the Act, 21 U.S.C.section 360bbb-3(b)(1), unless the authorization is terminated  or revoked sooner.       Influenza A by PCR NEGATIVE NEGATIVE Final   Influenza B by PCR NEGATIVE NEGATIVE Final    Comment: (NOTE) The Xpert Xpress SARS-CoV-2/FLU/RSV plus assay is intended as an aid in the diagnosis of influenza from Nasopharyngeal swab specimens and should not be used as a sole basis for treatment. Nasal washings and aspirates are unacceptable for Xpert Xpress SARS-CoV-2/FLU/RSV testing.  Fact Sheet for Patients: BloggerCourse.com  Fact Sheet for Healthcare Providers: SeriousBroker.it  This test is not yet approved or cleared by the [place] FDA and has been authorized for detection and/or diagnosis of SARS-CoV-2 by FDA under an Emergency Use Authorization (EUA). This EUA will remain in effect (meaning this test can be used) for the duration of the COVID-19 declaration under Section 564(b)(1) of the Act, 21 U.S.C. section 360bbb-3(b)(1), unless the authorization is terminated or revoked.  Performed at  [facility], [address] [place], [place] [address]   Culture, blood (routine x 2)     Status: Abnormal (Preliminary result)   Collection Time: 09/10/20 11:24 PM   Specimen: BLOOD  Result Value Ref Range Status   Specimen Description   Final    BLOOD BLOOD LEFT WRIST Performed at [facility], [address] [place], [place] [address]    Special Requests   Final    BOTTLES DRAWN AEROBIC AND ANAEROBIC Blood Culture results may not be optimal due to an inadequate volume of blood received in culture bottles Performed at [facility], [address], [place], [place] [address]    Culture  Setup Time   Final    Organism  ID to follow GRAM POSITIVE COCCI AEROBIC BOTTLE ONLY CRITICAL RESULT CALLED TO, READ BACK BY AND VERIFIED WITH: [facility] ON 09/11/20 AT 1918 QSD Performed at [facility] [facility], [address] [place], [place] [address]    Culture (A)  Final    STAPHYLOCOCCUS HOMINIS CULTURE REINCUBATED FOR BETTER GROWTH Performed at [facility], [address], [place], [place] [address]    Report Status PENDING  Incomplete  Blood Culture ID Panel (Reflexed)     Status: Abnormal   Collection Time: 09/10/20 11:24 PM  Result Value Ref Range Status   Enterococcus faecalis NOT DETECTED NOT DETECTED Final   Enterococcus Faecium NOT DETECTED NOT DETECTED Final   Listeria monocytogenes NOT DETECTED NOT DETECTED Final   Staphylococcus species DETECTED (A) NOT DETECTED Final    Comment: CRITICAL RESULT CALLED TO, READ BACK BY AND VERIFIED WITH: CARISSA DOLAN ON 09/11/20 AT 1918 QSD    Staphylococcus aureus (BCID) NOT DETECTED NOT DETECTED Final   Staphylococcus epidermidis DETECTED (A) NOT DETECTED Final    Comment: Methicillin (oxacillin) resistant coagulase negative staphylococcus. Possible blood culture contaminant (unless isolated from more than one blood culture draw or clinical case suggests pathogenicity). No antibiotic treatment is  indicated for blood  culture contaminants. CRITICAL RESULT CALLED TO, READ BACK BY AND VERIFIED WITH: CARISSA DOLAN ON 09/11/20 AT 1918 QSD    Staphylococcus lugdunensis NOT DETECTED NOT DETECTED Final   Streptococcus species NOT DETECTED NOT DETECTED Final   Streptococcus agalactiae NOT DETECTED NOT DETECTED Final   Streptococcus pneumoniae NOT DETECTED NOT DETECTED Final   Streptococcus pyogenes NOT DETECTED NOT DETECTED Final   A.calcoaceticus-baumannii NOT DETECTED NOT DETECTED Final   Bacteroides fragilis NOT DETECTED NOT DETECTED Final   Enterobacterales NOT DETECTED NOT DETECTED Final   Enterobacter cloacae complex NOT DETECTED NOT DETECTED Final   Escherichia coli NOT DETECTED NOT DETECTED Final   Klebsiella aerogenes NOT DETECTED NOT DETECTED Final   Klebsiella oxytoca NOT DETECTED NOT DETECTED Final   Klebsiella pneumoniae NOT DETECTED NOT DETECTED Final   Proteus species NOT DETECTED NOT DETECTED Final   Salmonella species NOT DETECTED NOT DETECTED Final   Serratia marcescens NOT DETECTED NOT DETECTED Final   Haemophilus influenzae NOT DETECTED NOT DETECTED Final   Neisseria meningitidis NOT DETECTED NOT DETECTED Final   Pseudomonas aeruginosa NOT DETECTED NOT DETECTED Final   Stenotrophomonas maltophilia NOT DETECTED NOT DETECTED Final   Candida albicans NOT DETECTED NOT DETECTED Final   Candida auris NOT DETECTED NOT DETECTED Final   Candida glabrata NOT DETECTED NOT DETECTED Final   Candida krusei NOT DETECTED NOT DETECTED Final   Candida parapsilosis NOT DETECTED NOT DETECTED Final   Candida tropicalis NOT DETECTED NOT DETECTED Final   Cryptococcus neoformans/gattii NOT DETECTED NOT DETECTED Final   Methicillin resistance mecA/C DETECTED (A) NOT DETECTED Final    Comment: CRITICAL RESULT CALLED TO, READ BACK BY AND VERIFIED WITH: CARISSA DOLAN ON 09/11/20 AT 1918 QSD Performed at [facility], [address] [place], [place] [address]      Labs: BNP (last  3 results) Recent Labs    09/10/20 2323  BNP 747.3*   Basic Metabolic Panel: Recent Labs  Lab 09/10/20 2306 09/11/20 0536 09/11/20 1347 09/12/20 1803  NA 136 142 140 136  K 3.4* 2.8* 3.3* 2.9*  CL 100 116* 110 104  CO2 24 22 22 23   GLUCOSE 224* 118* 134* 163*  BUN 30* 20 19 13   CREATININE 0.61 0.46 0.43* 0.47  CALCIUM 8.7* 6.0* 7.5* 7.8*  MG  --  1.5*  --   --    Liver Function Tests: Recent Labs  Lab 09/10/20 2306  AST 37  ALT 25  ALKPHOS 62  BILITOT 2.5*  PROT 7.0  ALBUMIN 3.6   No results for input(s): LIPASE, AMYLASE in the last 168 hours. No results for input(s): AMMONIA in the last 168 hours. CBC: Recent Labs  Lab 09/10/20 2306 09/11/20 0536  WBC 12.4* 9.9  NEUTROABS 9.5*  --   HGB 12.3 11.0*  HCT 36.4 32.1*  MCV 90.8 90.9  PLT 198 166   Cardiac Enzymes: No results for input(s): CKTOTAL, CKMB, CKMBINDEX, TROPONINI in the last 168 hours. BNP: Invalid input(s): POCBNP CBG: No results for input(s): GLUCAP in the last 168 hours. D-Dimer No results for input(s): DDIMER in the last 72 hours. Hgb A1c No results for input(s): HGBA1C in the last 72 hours. Lipid Profile No results for input(s): CHOL, HDL, LDLCALC, TRIG, CHOLHDL, LDLDIRECT in the last 72 hours. Thyroid function studies No results for input(s): TSH, T4TOTAL, T3FREE, THYROIDAB in the last 72 hours.  Invalid input(s): FREET3 Anemia work up No results for input(s): VITAMINB12, FOLATE, FERRITIN, TIBC, IRON, RETICCTPCT in the last 72 hours. Urinalysis    Component Value Date/Time   COLORURINE YELLOW (A) 09/10/2020 2323   APPEARANCEUR HAZY (A) 09/10/2020 2323   LABSPEC 1.029 09/10/2020 2323   PHURINE 5.0 09/10/2020 2323   GLUCOSEU 150 (A) 09/10/2020 2323   HGBUR MODERATE (A) 09/10/2020 2323   BILIRUBINUR NEGATIVE 09/10/2020 2323   KETONESUR 5 (A) 09/10/2020 2323   PROTEINUR 100 (A) 09/10/2020 2323   NITRITE NEGATIVE 09/10/2020 2323   LEUKOCYTESUR NEGATIVE 09/10/2020 2323   Sepsis  Labs Invalid input(s): PROCALCITONIN,  WBC,  LACTICIDVEN Microbiology Recent Results (from the past 240 hour(s))  Culture, blood (routine x 2)     Status: None (Preliminary result)   Collection Time: 09/10/20 11:05 PM   Specimen: BLOOD  Result Value Ref Range Status   Specimen Description BLOOD BLOOD RIGHT FOREARM  Final   Special Requests   Final    BOTTLES DRAWN AEROBIC AND ANAEROBIC Blood Culture results may not be optimal due to an inadequate volume of blood received in culture bottles   Culture   Final    NO GROWTH 4 DAYS Performed at [facility], [address] [place], [place] [address]    Report Status PENDING  Incomplete  Urine culture     Status: Abnormal   Collection Time: 09/10/20 11:23 PM   Specimen: Urine, Random  Result Value Ref Range Status   Specimen Description   Final    URINE, RANDOM Performed at [facility], [address] [place], [place] [address]    Special Requests   Final    NONE Performed at [facility], [address] [place], [place] [address]    Culture MULTIPLE SPECIES PRESENT, SUGGEST RECOLLECTION (A)  Final   Report Status 09/12/2020 FINAL  Final  Resp Panel by RT-PCR (Flu A&B, Covid) Nasopharyngeal Swab     Status: None   Collection Time: 09/10/20 11:23 PM   Specimen: Nasopharyngeal Swab; Nasopharyngeal(NP) swabs in vial transport medium  Result Value Ref Range Status   SARS Coronavirus 2 by RT PCR NEGATIVE NEGATIVE Final    Comment: (NOTE) SARS-CoV-2 target nucleic acids are NOT DETECTED.  The SARS-CoV-2 RNA is generally detectable in upper respiratory specimens during the acute phase of infection. The lowest concentration of SARS-CoV-2  viral copies this assay can detect is 138 copies/mL. A negative result does not preclude SARS-Cov-2 infection and should not be used as the sole basis for treatment or other patient management decisions. A negative result may occur with  improper specimen  collection/handling, submission of specimen other than nasopharyngeal swab, presence of viral mutation(s) within the areas targeted by this assay, and inadequate number of viral copies(<138 copies/mL). A negative result must be combined with clinical observations, patient history, and epidemiological information. The expected result is Negative.  Fact Sheet for Patients:  BloggerCourse.com  Fact Sheet for Healthcare Providers:  SeriousBroker.it  This test is no t yet approved or cleared by the [place] FDA and  has been authorized for detection and/or diagnosis of SARS-CoV-2 by FDA under an Emergency Use Authorization (EUA). This EUA will remain  in effect (meaning this test can be used) for the duration of the COVID-19 declaration under Section 564(b)(1) of the Act, 21 U.S.C.section 360bbb-3(b)(1), unless the authorization is terminated  or revoked sooner.       Influenza A by PCR NEGATIVE NEGATIVE Final   Influenza B by PCR NEGATIVE NEGATIVE Final    Comment: (NOTE) The Xpert Xpress SARS-CoV-2/FLU/RSV plus assay is intended as an aid in the diagnosis of influenza from Nasopharyngeal swab specimens and should not be used as a sole basis for treatment. Nasal washings and aspirates are unacceptable for Xpert Xpress SARS-CoV-2/FLU/RSV testing.  Fact Sheet for Patients: BloggerCourse.com  Fact Sheet for Healthcare Providers: SeriousBroker.it  This test is not yet approved or cleared by the [place] FDA and has been authorized for detection and/or diagnosis of SARS-CoV-2 by FDA under an Emergency Use Authorization (EUA). This EUA will remain in effect (meaning this test can be used) for the duration of the COVID-19 declaration under Section 564(b)(1) of the Act, 21 U.S.C. section 360bbb-3(b)(1), unless the authorization is terminated or revoked.  Performed at [facility], [address] [place], [place] [address]   Culture, blood (routine x 2)     Status: Abnormal (Preliminary result)   Collection Time: 09/10/20 11:24 PM   Specimen: BLOOD  Result Value Ref Range Status   Specimen Description   Final    BLOOD BLOOD LEFT WRIST Performed at [facility], [address] [place], [place] [address]    Special Requests   Final    BOTTLES DRAWN AEROBIC AND ANAEROBIC Blood Culture results may not be optimal due to an inadequate volume of blood received in culture bottles Performed at [facility], [address] [place], [place] [address]    Culture  Setup Time   Final    Organism ID to follow GRAM POSITIVE COCCI AEROBIC BOTTLE ONLY CRITICAL RESULT CALLED TO, READ BACK BY AND VERIFIED WITH: CARISSA [facility] ON 09/11/20 AT 1918 QSD Performed at [facility], [address], [place], [place] [address]    Culture (A)  Final    STAPHYLOCOCCUS HOMINIS CULTURE REINCUBATED FOR BETTER GROWTH Performed at [facility], [address] [place], [place] [address]    Report Status PENDING  Incomplete  Blood Culture ID Panel (Reflexed)     Status: Abnormal   Collection Time: 09/10/20 11:24 PM  Result Value Ref Range Status   Enterococcus faecalis NOT DETECTED NOT DETECTED Final   Enterococcus Faecium NOT DETECTED NOT DETECTED Final   Listeria monocytogenes NOT DETECTED NOT DETECTED Final   Staphylococcus species DETECTED (A) NOT DETECTED Final    Comment: CRITICAL RESULT  CALLED TO, READ BACK BY AND VERIFIED WITH: CARISSA DOLAN ON 09/11/20 AT 1918 QSD    Staphylococcus aureus (BCID) NOT DETECTED NOT DETECTED Final   Staphylococcus epidermidis DETECTED (A) NOT DETECTED Final    Comment: Methicillin (oxacillin) resistant coagulase negative staphylococcus. Possible blood culture contaminant (unless isolated from more than one blood culture draw or clinical case suggests pathogenicity). No antibiotic treatment is indicated  for blood  culture contaminants. CRITICAL RESULT CALLED TO, READ BACK BY AND VERIFIED WITH: CARISSA DOLAN ON 09/11/20 AT 1918 QSD    Staphylococcus lugdunensis NOT DETECTED NOT DETECTED Final   Streptococcus species NOT DETECTED NOT DETECTED Final   Streptococcus agalactiae NOT DETECTED NOT DETECTED Final   Streptococcus pneumoniae NOT DETECTED NOT DETECTED Final   Streptococcus pyogenes NOT DETECTED NOT DETECTED Final   A.calcoaceticus-baumannii NOT DETECTED NOT DETECTED Final   Bacteroides fragilis NOT DETECTED NOT DETECTED Final   Enterobacterales NOT DETECTED NOT DETECTED Final   Enterobacter cloacae complex NOT DETECTED NOT DETECTED Final   Escherichia coli NOT DETECTED NOT DETECTED Final   Klebsiella aerogenes NOT DETECTED NOT DETECTED Final   Klebsiella oxytoca NOT DETECTED NOT DETECTED Final   Klebsiella pneumoniae NOT DETECTED NOT DETECTED Final   Proteus species NOT DETECTED NOT DETECTED Final   Salmonella species NOT DETECTED NOT DETECTED Final   Serratia marcescens NOT DETECTED NOT DETECTED Final   Haemophilus influenzae NOT DETECTED NOT DETECTED Final   Neisseria meningitidis NOT DETECTED NOT DETECTED Final   Pseudomonas aeruginosa NOT DETECTED NOT DETECTED Final   Stenotrophomonas maltophilia NOT DETECTED NOT DETECTED Final   Candida albicans NOT DETECTED NOT DETECTED Final   Candida auris NOT DETECTED NOT DETECTED Final   Candida glabrata NOT DETECTED NOT DETECTED Final   Candida krusei NOT DETECTED NOT DETECTED Final   Candida parapsilosis NOT DETECTED NOT DETECTED Final   Candida tropicalis NOT DETECTED NOT DETECTED Final   Cryptococcus neoformans/gattii NOT DETECTED NOT DETECTED Final   Methicillin resistance mecA/C DETECTED (A) NOT DETECTED Final    Comment: CRITICAL RESULT CALLED TO, READ BACK BY AND VERIFIED WITH: CARISSA DOLAN ON 09/11/20 AT 1918 QSD Performed at [facility], [address], [place], [place] [address]      Time coordinating  discharge: 40 minutes  SIGNED:   Alba Cory, MD  [facility]

## 2020-09-14 NOTE — Care Management Important Message (Signed)
Important Message  Patient Details  Name: Elizabeth [name] MRN: [id number] Date of Birth: 05/18/1927   Medicare Important Message Given:  Other (see comment)  Patient is on comfort care and to discharge with Hospice care. Out of respect for the patient and family no Important Message from [facility] given.  Olegario Messier A Alison Breeding 09/14/2020, 8:11 AM

## 2020-09-15 LAB — CULTURE, BLOOD (ROUTINE X 2): Culture: NO GROWTH

## 2020-09-23 ENCOUNTER — Ambulatory Visit: Payer: Medicare HMO | Admitting: Medical

## 2020-10-08 DEATH — deceased

## 2020-12-06 ENCOUNTER — Other Ambulatory Visit: Payer: Self-pay | Admitting: Internal Medicine
